# Patient Record
Sex: Female | Born: 1980 | Race: Black or African American | Hispanic: No | Marital: Single
Health system: Midwestern US, Community
[De-identification: ages and names within clinical notes are randomized; demographics above are authoritative.]

## PROBLEM LIST (undated history)

## (undated) DIAGNOSIS — Z9889 Other specified postprocedural states: Secondary | ICD-10-CM

## (undated) DIAGNOSIS — N200 Calculus of kidney: Secondary | ICD-10-CM

## (undated) DIAGNOSIS — J45909 Unspecified asthma, uncomplicated: Secondary | ICD-10-CM

## (undated) DIAGNOSIS — Z87442 Personal history of urinary calculi: Secondary | ICD-10-CM

## (undated) DIAGNOSIS — R112 Nausea with vomiting, unspecified: Secondary | ICD-10-CM

## (undated) HISTORY — PX: TONSILLECTOMY: SUR1361

## (undated) HISTORY — PX: LITHOTRIPSY: SUR834

---

## 2007-02-24 LAB — URINE MICROSCOPIC
Casts: 0 /LPF
Crystals, urine: 0 /LPF

## 2007-02-24 LAB — HCG URINE, QL. - POC: Pregnancy test,urine (POC): NEGATIVE

## 2007-02-24 NOTE — ED Provider Notes (Signed)
Urinary Frequency   The history is provided by the patient. This is a new problem. The current episode started yesterday. The problem has been occurring every urination. The problem has been unchanged since onset. The quality of the pain is burning. There has been no fever. She is sexually active. Associated symptoms include frequency and abdominal pain. Pertinent negatives include no chills, no sweats, no nausea, no vomiting, no discharge, no hematuria, no hesitancy, no vaginal discharge and no back pain. Associated Symptoms: suprapubic pain. The patient is not pregnant.She has tried nothing for the symptoms. Her past medical history is significant for kidney stones.        Review of Systems   Constitutional: Negative.  Negative for chills.   Gastrointestinal: Positive for abdominal pain. Negative for nausea and vomiting.   Genitourinary: Positive for dysuria, urgency and frequency. Negative for hesitancy and hematuria.   Musculoskeletal: Negative for back pain.       Physical Exam   Nursing note and vitals reviewed.  Constitutional: Vital signs are normal. She appears well-developed and well-nourished.   Cardiovascular: Normal rate, regular rhythm and normal heart sounds.  Exam reveals no gallop and no friction rub.    No murmur heard.  Pulmonary/Chest: Effort normal and breath sounds normal. No respiratory distress. She has no wheezes. She has no rales.   Abdominal: Bowel sounds are normal. She exhibits no distension. Soft. There is no organomegaly. No tenderness. She has no CVA tenderness.   Neurological: She is alert.   Skin: Skin is warm and dry.       Meperidine    History   Social History   ??? Marital Status: Single     Spouse Name: N/A     Number of Children: N/A   ??? Years of Education: N/A   Occupational History   ??? Not on file.   Social History Main Topics   ??? Tobacco Use: Never   ??? Alcohol Use: No   ??? Drug Use: No   ??? Sexually Active: Yes -- Female partner(s)     Birth Control/ Protection: None    Other Topics Concern   ??? Not on file   Social History Narrative   ??? No narrative on file

## 2007-02-24 NOTE — ED Notes (Signed)
Pt unable to void at present. Water given.

## 2007-02-24 NOTE — ED Notes (Signed)
TO FOLLOW UP WITH PRIMARY MD AND RESTURN WITH WORSENING

## 2007-03-14 MED ORDER — FLUCONAZOLE 100 MG TAB
100 mg | ORAL | Status: AC
Start: 2007-03-14 — End: 2007-03-14
  Administered 2007-03-14: 18:00:00 via ORAL

## 2007-03-14 MED ORDER — SODIUM CHLORIDE 0.9 % IV
Freq: Once | INTRAVENOUS | Status: DC
Start: 2007-03-14 — End: 2007-03-14

## 2007-03-14 MED ORDER — ONDANSETRON HCL 2 MG/ML IV
2 mg/mL | INTRAVENOUS | Status: AC
Start: 2007-03-14 — End: 2007-03-14
  Administered 2007-03-14: 18:00:00 via INTRAVENOUS

## 2007-03-14 MED ADMIN — ceftriaxone (ROCEPHIN) 1 g IVPB: INTRAVENOUS | @ 18:00:00 | NDC 68330000501

## 2007-03-14 MED FILL — SODIUM CHLORIDE 0.9 % IV PIGGY BACK: INTRAVENOUS | Qty: 50

## 2007-03-14 MED FILL — ONDANSETRON HCL 2 MG/ML IV: 2 mg/mL | INTRAVENOUS | Qty: 2

## 2007-03-14 MED FILL — FLUCONAZOLE 100 MG TAB: 100 mg | ORAL | Qty: 2

## 2007-03-14 NOTE — ED Notes (Signed)
I have reviewed discharge instructions with the patient.  The patient verbalized understanding.

## 2007-03-14 NOTE — ED Notes (Signed)
Patient tells me she was seen here for a uti and later became nauseated and could not take the levaquin sh was prescribed.  She is back with the same uti symptoms and a vaginal yeast infection.

## 2007-03-14 NOTE — ED Provider Notes (Signed)
Bladder Infection   The history is provided by the patient. The current episode started more than 1 week ago. The problem has been occurring every urination. The problem has been gradually worsening since onset. The quality of the pain is burning. The pain is at a severity of 8/10. The pain is moderate. There has been no fever. She is sexually active. There is no history of pyelonephritis. Associated symptoms include nausea, vomiting, discharge, frequency, hematuria, hesitancy and vaginal discharge. Pertinent negatives include no chills, no sweats, no flank pain, no abdominal pain and no back pain. The patient is not pregnant.She has tried antibiotics for the symptoms. The treatment(s) provided no relief. Her past medical history is significant for kidney stones and recurrent UTIs.        Review of Systems   Constitutional: Negative for chills.   Gastrointestinal: Positive for nausea and vomiting. Negative for abdominal pain.   Genitourinary: Positive for dysuria, hesitancy, urgency, frequency and hematuria. Negative for flank pain.   Musculoskeletal: Negative for back pain.   All other systems reviewed and are negative.      Physical Exam   Nursing note and vitals reviewed.  Constitutional: She is oriented. She appears well-developed and well-nourished.   HENT:   Head: Normocephalic and atraumatic.   Eyes: Conjunctivae are normal.   Neck: Normal range of motion. Neck supple.   Cardiovascular: Normal rate, regular rhythm and normal heart sounds.    Pulmonary/Chest: Effort normal.   Abdominal: Bowel sounds are normal. Soft.   Genitourinary: Discharge found.   Musculoskeletal: Normal range of motion. She exhibits no edema and no tenderness.   Neurological: She is alert and oriented.   Skin: Skin is warm and dry.   Psychiatric: She has a normal mood and affect. Her behavior is normal. Judgment and thought content normal.       Meperidine    History   Social History   ??? Marital Status: Single     Spouse Name: N/A      Number of Children: N/A   ??? Years of Education: N/A   Occupational History   ??? Not on file.   Social History Main Topics   ??? Tobacco Use: Never   ??? Alcohol Use: No   ??? Drug Use: No   ??? Sexually Active: Yes -- Female partner(s)     Birth Control/ Protection: None   Other Topics Concern   ??? Not on file   Social History Narrative   ??? No narrative on file

## 2007-03-17 LAB — CULTURE URINE-ER

## 2007-04-16 NOTE — ED Notes (Signed)
Pt to er for stomach pain. Pt states that she is in so much pain that she cant talk, but she is able to talk on her cell phone.

## 2007-04-16 NOTE — ED Provider Notes (Signed)
HPI Comments: Pt with 2 days of right flank pain. Nausea and vomiting. Subjective fever and chills. Recent visits last month for UTI. Nausea and vomiting.  Flank Pain   The history is provided by the patient. This is a new problem. The current episode started yesterday. The problem has been gradually worsening since onset. The problem has been occurring constantly. Patient reports no work related injury.The pain is present in the right side. The quality of the pain is cramping. The pain does not radiate. The pain is at a severity of 10/10. The pain is moderate. The pain is the same all the time. Associated symptoms include fever and abdominal pain. She has tried nothing for the symptoms. Risk factors include history of kidney stones. The patient's surgical history non-contributory        Past Medical History   Diagnosis Date   ??? Chronic Kidney Disease      kidney stones   ??? Other Ill-Defined Conditions      kidney stones          Past Surgical History   Procedure Date   ??? Hx cesarean section      x2   ??? Hx other surgical      lithotripsy           No family history on file.     History   Social History   ??? Marital Status: Single     Spouse Name: N/A     Number of Children: N/A   ??? Years of Education: N/A   Occupational History   ??? Not on file.   Social History Main Topics   ??? Tobacco Use: Never   ??? Alcohol Use: No   ??? Drug Use: No   ??? Sexually Active: Yes -- Female partner(s)     Birth Control/ Protection: None   Other Topics Concern   ??? Not on file   Social History Narrative   ??? No narrative on file           ALLERGIES: Meperidine      Review of Systems   Constitutional: Positive for fever and chills.   Gastrointestinal: Positive for abdominal pain.   Genitourinary: Positive for flank pain.   Musculoskeletal: Positive for back pain.   All other systems reviewed and are negative.      Filed Vitals:    04/16/2007  8:51 PM 04/16/2007 10:19 PM 04/16/2007 10:26 PM   BP: 118/64 119/56 119/56   Pulse: 68 56 62    Temp: 99.2 ??F (37.3 ??C)  99.9 ??F (37.7 ??C)   Resp: 18  22   Height: 6' (1.829 m)     Weight: 150 lb (68.04 kg)     SpO2: 100% 92% 99%              Physical Exam   Nursing note and vitals reviewed.  Constitutional: She is oriented. She appears well-developed and well-nourished. She appears not diaphoretic. She appears distressed.   HENT:   Head: Normocephalic and atraumatic.   Right Ear: External ear normal.   Left Ear: External ear normal.   Nose: Nose normal.   Mouth/Throat: Oropharynx is clear and moist.   Eyes: Conjunctivae and extraocular motions are normal. Pupils are equal, round, and reactive to light.   Neck: Normal range of motion. Neck supple.   Cardiovascular: Normal rate, normal heart sounds and intact distal pulses.    Pulmonary/Chest: Effort normal and breath sounds normal. She has no wheezes. She has  no rales. She exhibits no tenderness.   Abdominal: Bowel sounds are normal. She exhibits no distension. Soft. No tenderness. She has no guarding.   Musculoskeletal: Normal range of motion. She exhibits no edema and no tenderness.   Neurological: She is alert and oriented. No cranial nerve deficit. Coordination normal.   Skin: Skin is warm and dry. No rash noted. She is not diaphoretic. No erythema. No pallor.   Psychiatric: She has a normal mood and affect. Her behavior is normal. Judgment and thought content normal.

## 2007-04-17 LAB — METABOLIC PANEL, BASIC
Anion gap: 11 (ref 7–16)
Anion gap: 10 (ref 7–16)
BUN: 11 MG/DL (ref 7–18)
BUN: 12 MG/DL (ref 7–18)
CO2: 25 MMOL/L (ref 21–32)
CO2: 25 MMOL/L (ref 21–32)
Calcium: 9 MG/DL (ref 8.4–10.4)
Calcium: 8.9 MG/DL (ref 8.4–10.4)
Chloride: 103 MMOL/L (ref 98–107)
Chloride: 102 MMOL/L (ref 98–107)
Creatinine: 0.9 MG/DL (ref 0.6–1.0)
Creatinine: 1.1 MG/DL — ABNORMAL HIGH (ref 0.6–1.0)
GFR est AA: >60 mL/min/{1.73_m2} (ref >60)
GFR est AA: >60 mL/min/{1.73_m2} (ref >60)
GFR est non-AA: >60 mL/min/{1.73_m2} (ref >60)
GFR est non-AA: >60 mL/min/{1.73_m2} (ref >60)
Glucose: 90 MG/DL (ref 74–106)
Glucose: 121 MG/DL — ABNORMAL HIGH (ref 74–106)
Potassium: 3.8 MMOL/L (ref 3.5–5.1)
Potassium: 3.8 MMOL/L (ref 3.5–5.1)
Sodium: 139 MMOL/L (ref 136–145)
Sodium: 137 MMOL/L (ref 136–145)

## 2007-04-17 LAB — URINE MICROSCOPIC
Casts: 0 /LPF
Crystals, urine: 0 /LPF
RBC: 0 /HPF

## 2007-04-17 LAB — CBC WITH AUTOMATED DIFF
ABS. BASOPHILS: 0 10*3/uL (ref 0.0–0.2)
ABS. EOSINOPHILS: 0 10*3/uL (ref 0.00–0.80)
ABS. LYMPHOCYTES: 1.6 10*3/uL (ref 0.6–4.3)
ABS. MONOCYTES: 0.8 10*3/uL (ref 0.1–0.9)
ABS. NEUTROPHILS: 10.3 10*3/uL — ABNORMAL HIGH (ref 1.9–7.8)
BASOPHILS: 0 % — ABNORMAL LOW (ref 0.1–1.6)
EOSINOPHILS: 0 % — ABNORMAL LOW (ref 0.5–7.8)
HCT: 38.6 % (ref 35.6–45.0)
HGB: 13 g/dL (ref 11.7–15.0)
LYMPHOCYTES: 13 % — ABNORMAL LOW (ref 14.7–41.3)
MCH: 32.1 PG (ref 26.1–32.9)
MCHC: 33.8 g/dL (ref 31.4–35.0)
MCV: 95.2 FL (ref 79.6–97.8)
MONOCYTES: 6 % (ref 3.2–9.0)
MPV: 7.5 FL (ref 7.4–10.4)
NEUTROPHILS: 81 % — ABNORMAL HIGH (ref 47.0–74.6)
PLATELET: 261 10*3/uL (ref 140–440)
RBC: 4.06 M/uL (ref 3.86–5.18)
RDW: 13.9 % (ref 11.9–14.6)
WBC: 12.8 10*3/uL — ABNORMAL HIGH (ref 4.5–10.5)

## 2007-04-17 LAB — HCG URINE, QL. - POC: Pregnancy test,urine (POC): NEGATIVE

## 2007-04-17 MED ORDER — CEFTRIAXONE 1 GRAM SOLUTION FOR INJECTION
1 gram | INTRAMUSCULAR | Status: AC
Start: 2007-04-17 — End: 2007-04-16
  Administered 2007-04-17: 03:00:00 via INTRAVENOUS

## 2007-04-17 MED ORDER — SODIUM CHLORIDE 0.9% BOLUS IV
0.9 % | INTRAVENOUS | Status: DC
Start: 2007-04-17 — End: 2007-04-17
  Administered 2007-04-17: 03:00:00 via INTRAVENOUS

## 2007-04-17 MED ORDER — ONDANSETRON 8 MG TAB, RAPID DISSOLVE
8 mg | ORAL | Status: AC
Start: 2007-04-17 — End: 2007-04-17
  Administered 2007-04-17: 05:00:00 via ORAL

## 2007-04-17 MED ORDER — MORPHINE 10 MG/ML INJ SOLUTION
10 mg/ml | INTRAMUSCULAR | Status: AC
Start: 2007-04-17 — End: 2007-04-16
  Administered 2007-04-17: 03:00:00 via INTRAVENOUS

## 2007-04-17 MED ORDER — HYDROMORPHONE 2 MG/ML INJECTION SOLUTION
2 mg/mL | INTRAMUSCULAR | Status: AC
Start: 2007-04-17 — End: 2007-04-17

## 2007-04-17 MED ORDER — SODIUM CHLORIDE 0.9% BOLUS IV
0.9 % | INTRAVENOUS | Status: DC
Start: 2007-04-17 — End: 2007-04-17
  Administered 2007-04-17: 18:00:00 via INTRAVENOUS

## 2007-04-17 MED ORDER — KETOROLAC TROMETHAMINE 30 MG/ML INJECTION
30 mg/mL (1 mL) | INTRAMUSCULAR | Status: AC
Start: 2007-04-17 — End: 2007-04-17
  Administered 2007-04-17: 21:00:00 via INTRAVENOUS

## 2007-04-17 MED ORDER — HYDROMORPHONE 2 MG/ML INJECTION SOLUTION
2 mg/mL | INTRAMUSCULAR | Status: AC
Start: 2007-04-17 — End: 2007-04-17
  Administered 2007-04-17: 20:00:00 via INTRAVENOUS

## 2007-04-17 MED ADMIN — ondansetron (ZOFRAN) 16 mg in 0.9% sodium chloride 50 mL IVPB: INTRAVENOUS | @ 18:00:00 | NDC 72266012401

## 2007-04-17 MED ADMIN — Ondansetron (ZOFRAN) injection solution 4 mg: INTRAVENOUS | @ 03:00:00 | NDC 24200015844

## 2007-04-17 MED ADMIN — HYDROmorphone (DILAUDID) 2 mg/mL injection: INTRAVENOUS | @ 18:00:00 | NDC 00409131236

## 2007-04-17 MED ADMIN — levofloxacin (LEVAQUIN) 500 mg IVPB: INTRAVENOUS | @ 18:00:00 | NDC 72789008550

## 2007-04-17 MED FILL — HYDROMORPHONE 2 MG/ML INJECTION SOLUTION: 2 mg/mL | INTRAMUSCULAR | Qty: 1

## 2007-04-17 MED FILL — KETOROLAC TROMETHAMINE 30 MG/ML INJECTION: 30 mg/mL (1 mL) | INTRAMUSCULAR | Qty: 1

## 2007-04-17 MED FILL — LEVAQUIN 500 MG/100 ML IN 5% DEXTROSE INTRAVENOUS PIGGYBACK: 500 mg/100 mL | INTRAVENOUS | Qty: 100

## 2007-04-17 MED FILL — SODIUM CHLORIDE 0.9 % IV PIGGY BACK: INTRAVENOUS | Qty: 50

## 2007-04-17 MED FILL — MORPHINE 10 MG/ML SYRINGE: 10 mg/mL | INTRAMUSCULAR | Qty: 1

## 2007-04-17 MED FILL — ONDANSETRON HCL 2 MG/ML IV: 2 mg/mL | INTRAVENOUS | Qty: 8

## 2007-04-17 MED FILL — ONDANSETRON (PF) 4 MG/2 ML INJECTION: 4 mg/2 mL | INTRAMUSCULAR | Qty: 2

## 2007-04-17 NOTE — ED Notes (Signed)
Pt here last pm and tx for UTI. PT states continuing pain and N/v.  Pt states vomiting all day.  Pt c/o RLQ pain and is writhing on bed.  Pt w/vomiting x1.

## 2007-04-17 NOTE — ED Notes (Signed)
Pt sleeping in no distress, awakened pt to inform giving more med,pt then began complaing of pain.

## 2007-04-17 NOTE — ED Notes (Signed)
Pt seen ambulatory at doorway leaving hosp man (brother assumed )w/ pt

## 2007-04-17 NOTE — ED Provider Notes (Signed)
HPI Comments: Patient seen last night at [facility] for right flank pain and was diagnosed with  " kidney stone and kidney infection " according to patient.  She was discharged this morning with prescriptions for Cipro, Lortab and phenergan.  She returns to [facility] c/o not being able to keep anything down since leaving the ED this morning. She reports she is urinating.  No F/C or gross hematuria.  Back Pain   The history is provided by the patient.        Past Medical History   Diagnosis Date   ??? Chronic Kidney Disease      kidney stones   ??? Other Ill-Defined Conditions      kidney stones          Past Surgical History   Procedure Date   ??? Hx cesarean section      x2   ??? Hx other surgical      lithotripsy           No family history on file.     History   Social History   ??? Marital Status: Single     Spouse Name: N/A     Number of Children: N/A   ??? Years of Education: N/A   Occupational History   ??? Not on file.   Social History Main Topics   ??? Tobacco Use: Never   ??? Alcohol Use: No   ??? Drug Use: No   ??? Sexually Active: Yes -- Female partner(s)     Birth Control/ Protection: None   Other Topics Concern   ??? Not on file   Social History Narrative   ??? No narrative on file           ALLERGIES: Meperidine      Review of Systems   Musculoskeletal: Positive for back pain.   All other systems reviewed and are negative.      Filed Vitals:    04/17/2007 12:46 PM   BP: 100/62   Pulse: 73   Temp: 98.2 ??F (36.8 ??C)   Resp: 18   Height: 6' (1.829 m)   Weight: 150 lb (68.04 kg)   SpO2: 100%              Physical Exam   Nursing note and vitals reviewed.  Constitutional: She is oriented. She appears well-nourished. She appears not diaphoretic. No distress.        Supine in NAD or apparent discomfort.   HENT:   Head: Normocephalic and atraumatic.        Tongue appears mildly dry, lips are dry   Eyes: Conjunctivae are normal. Pupils are equal, round, and reactive to light. No scleral icterus.   Neck: Normal range of motion. Neck supple.    Cardiovascular: Normal rate and regular rhythm.    Pulmonary/Chest: Effort normal and breath sounds normal.   Abdominal: Soft. No tenderness. She has no rebound and no guarding.   Genitourinary:        Right flank discomfort to percussion   Musculoskeletal: Normal range of motion. She exhibits no edema and no tenderness.   Neurological: She is alert and oriented.   Skin: Skin is warm and dry. She is not diaphoretic.

## 2007-04-17 NOTE — ED Notes (Signed)
IV Levaquin stopped to infuse IV zofran.

## 2007-04-17 NOTE — ED Notes (Signed)
Pt sleeping awakened and given ice chips via dr. Grier Rocher

## 2007-04-17 NOTE — ED Notes (Signed)
Pt given d/c instructions-verbalized understanding,instructed pt no driving,verbalized understanding

## 2007-04-17 NOTE — ED Notes (Signed)
Lpt cont in er dept waiting on ride n/v emesis in trashcan requesting mylicon or some kind of RX  Lper DR Kerry Hough -zofran 49m ODT given   Verified w/ ride -brother- "is on his way"

## 2007-04-17 NOTE — ED Notes (Signed)
Pt returned due to vomitting

## 2007-04-17 NOTE — ED Notes (Signed)
Pt ride arrived to take pt home

## 2007-04-19 LAB — CULTURE URINE-ER

## 2007-04-24 LAB — CBC W/O DIFF
HCT: 33.4 % — ABNORMAL LOW (ref 35.6–45.0)
HGB: 11.1 g/dL — ABNORMAL LOW (ref 11.7–15.0)
MCH: 31.5 PG (ref 26.1–32.9)
MCHC: 33.3 g/dL (ref 31.4–35.0)
MCV: 94.6 FL (ref 79.6–97.8)
MPV: 7.3 FL — ABNORMAL LOW (ref 7.4–10.4)
PLATELET: 255 10*3/uL (ref 140–440)
RBC: 3.53 M/uL — ABNORMAL LOW (ref 3.86–5.18)
RDW: 14 % (ref 11.9–14.6)
WBC: 7.9 10*3/uL (ref 4.5–10.5)

## 2007-04-24 LAB — METABOLIC PANEL, BASIC
Anion gap: 11 (ref 7–16)
BUN: 10 MG/DL (ref 7–18)
CO2: 26 MMOL/L (ref 21–32)
Calcium: 8.4 MG/DL (ref 8.4–10.4)
Chloride: 101 MMOL/L (ref 98–107)
Creatinine: 1.1 MG/DL — ABNORMAL HIGH (ref 0.6–1.0)
GFR est AA: >60 mL/min/{1.73_m2} (ref >60)
GFR est non-AA: >60 mL/min/{1.73_m2} (ref >60)
Glucose: 98 MG/DL (ref 74–106)
Potassium: 3.3 MMOL/L — ABNORMAL LOW (ref 3.5–5.1)
Sodium: 138 MMOL/L (ref 136–145)

## 2007-04-24 MED ORDER — SODIUM CHLORIDE 0.9% BOLUS IV
0.9 % | INTRAVENOUS | Status: DC
Start: 2007-04-24 — End: 2007-04-24
  Administered 2007-04-24: 09:00:00 via INTRAVENOUS

## 2007-04-24 MED ORDER — KETOROLAC TROMETHAMINE 30 MG/ML INJECTION
30 mg/mL (1 mL) | INTRAMUSCULAR | Status: AC
Start: 2007-04-24 — End: 2007-04-24
  Administered 2007-04-24: 09:00:00 via INTRAVENOUS

## 2007-04-24 MED ADMIN — morphine injection 2 mg: INTRAVENOUS | @ 10:00:00 | NDC 76045000411

## 2007-04-24 MED FILL — KETOROLAC TROMETHAMINE 30 MG/ML INJECTION: 30 mg/mL (1 mL) | INTRAMUSCULAR | Qty: 1

## 2007-04-24 MED FILL — MORPHINE 2 MG/ML INJECTION: 2 mg/mL | INTRAMUSCULAR | Qty: 1

## 2007-04-24 NOTE — ED Notes (Signed)
Continue to await CT results.

## 2007-04-24 NOTE — ED Provider Notes (Incomplete)
Back Pain   The history is provided by the patient. This is a chronic problem. The current episode started more than 2 days ago. The problem has been unchanged since onset. The problem has been occurring constantly. Patient reports no work related injury.The pain is associated with no known injury. The pain is present in the right side and lower back. The quality of the pain is stabbing. The pain is at a severity of 10/10. The pain is severe. Associated symptoms include abdominal pain and dysuria. She has tried analgesics for the symptoms. The treatment(s) provided no relief. Risk factors include history of kidney stones and pyelonephritis.   Past Medical History   Diagnosis Date   ??? Chronic Kidney Disease      kidney stones   ??? Other Ill-Defined Conditions      kidney stones          Past Surgical History   Procedure Date   ??? Hx cesarean section      x2   ??? Hx other surgical      lithotripsy           No family history on file.     History   Social History   ??? Marital Status: Single     Spouse Name: N/A     Number of Children: N/A   ??? Years of Education: N/A   Occupational History   ??? Not on file.   Social History Main Topics   ??? Tobacco Use: Never   ??? Alcohol Use: No   ??? Drug Use: No   ??? Sexually Active: Yes -- Female partner(s)     Birth Control/ Protection: None   Other Topics Concern   ??? Not on file   Social History Narrative   ??? No narrative on file           ALLERGIES: Meperidine      Review of Systems   Gastrointestinal: Positive for nausea and abdominal pain.   Genitourinary: Positive for dysuria.   Musculoskeletal: Positive for back pain.       Filed Vitals:    04/24/2007  4:43 AM   BP: 113/55   Pulse: 96   Temp: 98.7 ??F (37.1 ??C)   Resp: 16   Height: 6' (1.829 m)   Weight: 140 lb (63.504 kg)   SpO2: 100%              Physical Exam   Nursing note and vitals reviewed.  Constitutional: She is oriented. She appears well-developed and well-nourished.   HENT:   Head: Normocephalic and atraumatic.    Eyes: Conjunctivae and extraocular motions are normal. Pupils are equal, round, and reactive to light.   Neck: Normal range of motion. Neck supple.   Cardiovascular: Normal rate, regular rhythm, normal heart sounds and intact distal pulses.    Pulmonary/Chest: Effort normal and breath sounds normal.   Abdominal: Tenderness is present.   Musculoskeletal: Normal range of motion.   Neurological: She is alert and oriented.   Skin: Skin is warm and dry.   Psychiatric: She has a normal mood and affect. Her behavior is normal. Judgment and thought content normal.          Patient on CT scan has multiple stones. She is contol in her pain. She has an appointment with her PCP Dr. Chilton Si to discuss treatment options. She has an urologist at [facility].

## 2007-04-24 NOTE — ED Notes (Signed)
Discussed with Dr Deloria Lair urology.  She will be managed as an outpatient.

## 2007-04-24 NOTE — ED Notes (Signed)
I have reviewed discharge instructions with the patient.  The patient verbalized understanding. To follow up with referral MD and return with worsening. Instructed not to drive. To return with worsening.

## 2007-04-24 NOTE — ED Notes (Signed)
To xray for ct

## 2007-04-26 LAB — URINE MICROSCOPIC
Casts: 0 /LPF
Crystals, urine: 0 /LPF
Mucus: 0 /LPF

## 2007-04-26 LAB — URINE MACROSCOPIC
Bilirubin: NEGATIVE
Blood: NEGATIVE
Glucose: NEGATIVE MG/DL
Ketone: 15 MG/DL — AB
Nitrites: NEGATIVE
Protein: NEGATIVE MG/DL
Specific gravity: 1.015 (ref 1.001–1.02)
Urobilinogen: 0.2 EU/DL (ref 0.2–1.0)
pH (UA): 7 (ref 5.0–9.0)

## 2007-04-26 MED ORDER — MIDAZOLAM 1 MG/ML IJ SOLN
1 mg/mL | Freq: Once | INTRAMUSCULAR | Status: DC
Start: 2007-04-26 — End: 2007-04-29

## 2007-04-26 MED ORDER — OXYCODONE-ACETAMINOPHEN 5 MG-500 MG CAP
5-500 mg | Freq: Once | ORAL | Status: DC
Start: 2007-04-26 — End: 2007-04-29

## 2007-04-26 MED ORDER — ONDANSETRON (PF) 4 MG/2 ML INJECTION
4 mg/2 mL | INTRAMUSCULAR | Status: DC | PRN
Start: 2007-04-26 — End: 2007-04-29

## 2007-04-26 MED ORDER — LACTATED RINGERS IV
INTRAVENOUS | Status: DC
Start: 2007-04-26 — End: 2007-04-29

## 2007-04-26 MED ORDER — LIDOCAINE HCL 1 % (10 MG/ML) IJ SOLN
10 mg/mL (1 %) | Freq: Once | INTRAMUSCULAR | Status: DC
Start: 2007-04-26 — End: 2007-04-29

## 2007-04-26 MED ORDER — OXYCODONE-ACETAMINOPHEN 5 MG-500 MG CAP
5-500 mg | Freq: Once | ORAL | Status: AC
Start: 2007-04-26 — End: 2007-04-26

## 2007-04-26 MED ORDER — ATROPINE 0.4 MG/ML IJ SOLN
0.4 mg/mL | INTRAMUSCULAR | Status: DC | PRN
Start: 2007-04-26 — End: 2007-04-29

## 2007-04-26 MED ORDER — SODIUM CHLORIDE 0.9 % IV
INTRAVENOUS | Status: DC
Start: 2007-04-26 — End: 2007-04-29

## 2007-04-26 MED ORDER — MORPHINE 10 MG/ML INJ SOLUTION
10 mg/ml | INTRAMUSCULAR | Status: DC | PRN
Start: 2007-04-26 — End: 2007-04-29

## 2007-04-26 MED ORDER — CEFAZOLIN 1 GRAM/50 ML IN DEXTROSE (ISO-OSMOTIC) IVPB
1 gram/50 mL | Freq: Once | INTRAVENOUS | Status: DC
Start: 2007-04-26 — End: 2007-04-29

## 2007-04-26 MED ADMIN — midazolam (VERSED) 1 mg/mL injection: INTRAVENOUS | @ 17:00:00 | NDC 66758001802

## 2007-04-26 MED ADMIN — oxycodone-acetaminophen (TYLOX) 5-500 mg per capsule: @ 19:00:00 | NDC 60951066085

## 2007-04-26 MED ADMIN — cefazolin (ANCEF) 1 gram/50 mL IVPB: @ 17:00:00 | NDC 63323023765

## 2007-04-26 MED FILL — CEFAZOLIN 1 GRAM/50 ML IN DEXTROSE (ISO-OSMOTIC) IVPB: 1 gram/50 mL | INTRAVENOUS | Qty: 1

## 2007-04-26 MED FILL — LACTATED RINGERS IV: INTRAVENOUS | Qty: 1000

## 2007-04-26 MED FILL — OXYCODONE-ACETAMINOPHEN 5 MG-500 MG CAP: 5-500 mg | ORAL | Qty: 1

## 2007-04-26 MED FILL — MIDAZOLAM 1 MG/ML IJ SOLN: 1 mg/mL | INTRAMUSCULAR | Qty: 2

## 2007-04-29 NOTE — Progress Notes (Unsigned)
[facility]   [address]   [place], [place]. [address]   [phone]     OPERATIVE REPORT    NAME: Sejla, Marzano MR: [id number]  LOC: SO SEX: F ACCT: [id number]  DOB: 07-25-1980 AGE: 26 PT: O  ADMIT: 04/26/2007 DSCH: MSV: MED    DATE OF SURGERY: 04/26/2007    PREOPERATIVE DIAGNOSIS: Right upper ureteral stone.    POSTOPERATIVE DIAGNOSIS: Right upper ureteral stone.    PROCEDURE: Right extracorporeal shock-wave lithotripsy.    SURGEON: Almir Botts E. Jakhiya Brower, M.D.    ANESTHESIA: Sedation.    ESTIMATED BLOOD LOSS: None.    COMPLICATIONS: None.    INDICATIONS: The patient is a 26 year old female with a 6 mm right upper  ureteral stone.    OPERATIVE NOTE IN DETAIL: The patient was taken to the operating room  and placed supine on the lithotripsy table. After induction of adequate  sedation, the stone was localized under fluoroscopy. It appeared to  fragment very well at the termination of the procedure. She tolerated  the procedure well and was awakened from anesthesia and taken to the  recovery room in stable condition.        ____________________________________Brant Tommi Emery, MD A     This is an unverified document unless signed by physician.    TID: [id number] DT: 04/29/2007 5:47 A  JOB: [id number] DOC#: [id number] DD: 04/28/2007    cc: Teodora Medici, MD

## 2007-05-07 MED FILL — PROMETHAZINE 25 MG/ML INJECTION: 25 mg/mL | INTRAMUSCULAR | Qty: 1

## 2007-05-07 MED FILL — NALBUPHINE 10 MG/ML INJECTION: 10 mg/mL | INTRAMUSCULAR | Qty: 1

## 2007-09-13 MED ORDER — AMOXICILLIN 500 MG TABLET
500 mg | ORAL_TABLET | Freq: Three times a day (TID) | ORAL | Status: DC
Start: 2007-09-13 — End: 2007-10-02

## 2007-09-13 NOTE — ED Notes (Signed)
This RN for dc only.

## 2007-09-13 NOTE — ED Provider Notes (Addendum)
Ear Pain   The history is provided by the patient. This is a new problem. The current episode started more than 2 days ago (3 days ago). The problem occurs constantly. The problem has not changed since onset. Patient complains that both ears are affected.  Patient reports a subjective fever - was not measured.Associated symptoms include sore throat and cough.        Past Medical History   Diagnosis Date   ??? Chronic Kidney Disease      kidney stones   ??? Other Ill-Defined Conditions      kidney stones          Past Surgical History   Procedure Date   ??? Hx cesarean section      x2   ??? Hx heent      tonsil   ??? Hx other surgical      lithotripsy           No family history on file.     History   Social History   ??? Marital Status: Single     Spouse Name: N/A     Number of Children: N/A   ??? Years of Education: N/A   Occupational History   ??? Not on file.   Social History Main Topics   ??? Tobacco Use: Never   ??? Alcohol Use: No   ??? Drug Use: No   ??? Sexually Active: Yes -- Female partner(s)     Birth Control/ Protection: None   Other Topics Concern   ??? Not on file   Social History Narrative   ??? No narrative on file           ALLERGIES: Meperidine      Review of Systems   Constitutional: Positive for fever.   HENT: Positive for ear pain and sore throat.    Respiratory: Positive for cough.    All other systems reviewed and are negative.      Filed Vitals:    09/13/2007  8:36 AM   BP: 155/55   Pulse: 84   Temp: 97.5 ??F (36.4 ??C)   Resp: 19   Height: 6' (1.829 m)   Weight: 150 lb (68.04 kg)   SpO2: 99%              Physical Exam   Nursing note and vitals reviewed.  Constitutional: She is oriented. She appears well-developed and well-nourished. No distress.   HENT:   Head: Normocephalic and atraumatic.   Right Ear: External ear normal.   Left Ear: External ear normal.   Mouth/Throat: Oropharynx is clear and moist.   Eyes: Conjunctivae are normal. Pupils are equal, round, and reactive to light.    Neck: Normal range of motion. Neck supple.   Cardiovascular: Normal rate, regular rhythm and normal heart sounds.  Exam reveals no gallop and no friction rub.    No murmur heard.  Pulmonary/Chest: Effort normal and breath sounds normal. No respiratory distress. She has no wheezes. She has no rales.        Lungs clear   Abdominal: Soft. Bowel sounds are normal. She exhibits no distension. No tenderness.   Musculoskeletal: Normal range of motion.   Neurological: She is alert and oriented.   Skin: Skin is warm and dry.   Psychiatric: She has a normal mood and affect. Her behavior is normal.        Coding

## 2007-09-13 NOTE — ED Notes (Signed)
I have reviewed discharge instructions with the patient.  The patient verbalized understanding. To follow up with primary physician and return with worsening.

## 2007-10-02 NOTE — ED Notes (Signed)
[place]: viable single IUP. Right ovarian cyst. Uterine fibroid

## 2007-10-02 NOTE — ED Notes (Signed)
Discharge instructions given to patient  1 prescription, ovarian cyst sheet  Patient understands discharge instructions

## 2007-10-02 NOTE — ED Provider Notes (Signed)
HPI Comments: Patient has a known h/o kidney stones. She has seen Dr. Cedric Fishman for these in the past. She also has a known ovarian cyst on the right. She is about 2 months pregnant. She has an OB appointment with Dr. Samuella Cota next week. Denies any abdominal pain, fever, or vaginal bleeding.    Flank Pain   The history is provided by the patient. This is a recurrent problem. The problem has not changed since onset. The pain is associated with no known injury. The pain is present in the right side. The pain quality is described as similar to previous episodes. The pain is mild. Pertinent negatives include no fever, no abdominal pain and no dysuria. Risk factors include history of kidney stones.        Past Medical History   Diagnosis Date   ??? Chronic Kidney Disease      kidney stones   ??? Other Ill-Defined Conditions      kidney stones          Past Surgical History   Procedure Date   ??? Hx cesarean section      x2   ??? Hx heent      tonsil   ??? Hx other surgical      lithotripsy           No family history on file.     History   Social History   ??? Marital Status: Single     Spouse Name: N/A     Number of Children: N/A   ??? Years of Education: N/A   Occupational History   ??? Not on file.   Social History Main Topics   ??? Tobacco Use: Never   ??? Alcohol Use: No   ??? Drug Use: No   ??? Sexually Active: Yes -- Female partner(s)     Birth Control/ Protection: None   Other Topics Concern   ??? Not on file   Social History Narrative   ??? No narrative on file           ALLERGIES: Meperidine      Review of Systems   Constitutional: Negative for fever.   Respiratory: Negative.    Cardiovascular: Negative.    Gastrointestinal: Negative for nausea, vomiting and abdominal pain.   Genitourinary: Positive for flank pain. Negative for dysuria, urgency, frequency and hematuria.   Musculoskeletal: Positive for back pain.   All other systems reviewed and are negative.      Filed Vitals:    10/02/2007 11:12 AM 10/02/2007 11:35 AM 10/02/2007  1:41 PM    BP: 107/56  123/55   Pulse: 67  87   Temp: 98.6 ??F (37 ??C)     Resp: 20  18   Height: 6' (1.829 m)     Weight: 157 lb (71.215 kg)     SpO2: 98% 98%               Physical Exam   Nursing note and vitals reviewed.  Constitutional: She is oriented. She appears well-developed and well-nourished. She appears not diaphoretic. No distress.   HENT:   Head: Normocephalic and atraumatic.   Nose: Nose normal.   Mouth/Throat: Oropharynx is clear and moist.   Eyes: Conjunctivae and extraocular motions are normal. Pupils are equal, round, and reactive to light.   Neck: Normal range of motion. Neck supple.   Cardiovascular: Normal rate, regular rhythm and normal heart sounds.    Pulmonary/Chest: Effort normal and breath sounds normal.  Abdominal: Soft. No tenderness. She has no rebound and no guarding.   Musculoskeletal: Normal range of motion. She exhibits no edema and no tenderness.   Neurological: She is alert and oriented.   Skin: Skin is warm and dry. She is not diaphoretic.        MDM Coding   Interpretation: ultrasound                The patient was observed in the ED.    Results Reviewed:      No results found for this or any previous visit (from the past 24 hour(s)).    I discussed the results of all labs, procedures, radiographs, and treatments with the patient and available family.  Treatment plan is agreed upon and the patient is ready for discharge.  All voiced understanding of the discharge plan and medication instructions or changes as appropriate.  Questions about treatment in the ED were answered.  All were encouraged to return should symptoms worsen or new problems develop.

## 2007-10-02 NOTE — ED Notes (Signed)
To Bed B  C/o flank pain

## 2007-10-07 LAB — RPR
RPR, EXTERNAL: REACTIVE
RPR, External: REACTIVE

## 2007-10-07 LAB — BLOOD TYPE, (ABO+RH)
ABO,Rh: A POS
TYPE, ABO & RH, EXTERNAL: A POS

## 2007-10-07 LAB — HEMOGLOBIN: Hgb, External: 12

## 2007-10-07 LAB — TSH 3RD GENERATION: TSH, External: 0.28

## 2007-10-07 LAB — ANTIBODY SCREEN: Antibody screen, External: NEGATIVE

## 2007-10-07 LAB — N GONORRHOEAE, DNA PROBE: Gonorrhea, External: NEGATIVE

## 2007-10-07 LAB — RUBELLA AB, IGM

## 2007-10-07 LAB — URINALYSIS W/O MICRO

## 2007-10-07 LAB — CHLAMYDIA DNA PROBE: Chlamydia, External: NEGATIVE

## 2007-10-07 LAB — HEP B SURFACE AG: HBsAg, External: NEGATIVE

## 2007-10-07 LAB — HEMATOCRIT: Hct, External: 32.7

## 2007-10-19 LAB — INFLUENZA A & B AG (RAPID TEST)
Influenza A Ag: NEGATIVE
Influenza B Ag: NEGATIVE

## 2007-10-19 MED ORDER — ACETAMINOPHEN 500 MG TAB
500 mg | ORAL | Status: AC
Start: 2007-10-19 — End: 2007-10-19
  Administered 2007-10-19: 15:00:00 via ORAL

## 2007-10-19 MED FILL — ACETAMINOPHEN 500 MG TAB: 500 mg | ORAL | Qty: 2

## 2007-10-19 NOTE — ED Notes (Signed)
I have reviewed the chart and agree with the midlevel provider's course of treatment.  Demetrios Byron A Corbett III, MD

## 2007-10-19 NOTE — ED Provider Notes (Signed)
HPI Comments: Denise Turner is a 27 y.o. African American female who is going on 3 months pregnant and feels like she has the flu.  Vomited last night, and today she has a headache, cough and nasal congestion. Neck fine.  Headache is mild and was of gradual onset. Denies fever and aches, but feels chilled. (exam room quite cold). No diarrhea or abdominal pain. No vaginal bleeding. Denies sore throat.          Headache   The history is provided by the patient.        Past Medical History   Diagnosis Date   ??? Chronic Kidney Disease      kidney stones   ??? Other Ill-Defined Conditions      kidney stones          Past Surgical History   Procedure Date   ??? Hx cesarean section      x2   ??? Hx heent      tonsil   ??? Hx other surgical      lithotripsy           No family history on file.     History   Social History   ??? Marital Status: Single     Spouse Name: N/A     Number of Children: N/A   ??? Years of Education: N/A   Occupational History   ??? Not on file.   Social History Main Topics   ??? Tobacco Use: Never   ??? Alcohol Use: No   ??? Drug Use: No   ??? Sexually Active: Yes -- Female partner(s)     Birth Control/ Protection: None   Other Topics Concern   ??? Not on file   Social History Narrative   ??? No narrative on file           ALLERGIES: Meperidine      Review of Systems   All other systems reviewed and are negative.      Filed Vitals:    10/19/2007 10:24 AM 10/19/2007 10:28 AM   BP:  116/47   Pulse:  83   Temp: 97.7 ??F (36.5 ??C)    Resp:  20   Height:  6' (1.829 m)   Weight:  157 lb (71.215 kg)   SpO2:  98%              Physical Exam   Nursing note and vitals reviewed.  Constitutional: She is oriented. She appears well-developed and well-nourished. She appears not diaphoretic. No distress.   HENT:   Head: Normocephalic and atraumatic.   Right Ear: External ear normal.   Left Ear: External ear normal.    Eyes: Conjunctivae and extraocular motions are normal. Pupils are equal, round, and reactive to light. Right eye exhibits no discharge. Left eye exhibits no discharge.   Neck: Normal range of motion. Neck supple.   Cardiovascular: Normal rate, regular rhythm and normal heart sounds.    Pulmonary/Chest: Effort normal and breath sounds normal.   Musculoskeletal: Normal range of motion.   Lymphadenopathy:     She has no cervical adenopathy.   Neurological: She is alert and oriented. No cranial nerve deficit. She exhibits normal muscle tone. Coordination normal.   Skin: Skin is warm and dry. She is not diaphoretic.   Psychiatric: She has a normal mood and affect. Her behavior is normal. Judgment and thought content normal.        Coding

## 2007-10-19 NOTE — ED Notes (Signed)
Pt given d/c instructions-verbalized understanding

## 2007-10-19 NOTE — ED Notes (Signed)
PT. STATES SHE IS 2 MONTHS PREGNANT/STATES SHE DOESN'T REMEMBER WHEN HER LAST PERIOD WAS

## 2007-11-14 NOTE — ED Notes (Signed)
I have reviewed the chart and agree with the midlevel provider's course of treatment.  Taggert Bozzi Louis Arnel Wymer, MD

## 2007-12-19 ENCOUNTER — Emergency Department (HOSPITAL_COMMUNITY): Admission: EM | Admit: 2007-12-19 | Discharge: 2007-12-19 | Payer: Self-pay | Admitting: Emergency Medicine

## 2008-01-31 LAB — POC URINE DIPSTICK MANUAL
Ketones (POC): NEGATIVE
Leukocyte esterase (POC): NEGATIVE

## 2008-01-31 MED ADMIN — terbutaline (BRETHINE) injection 0.25 mg: SUBCUTANEOUS | @ 17:00:00 | NDC 54569168901

## 2008-01-31 MED ADMIN — nalbuphine (NUBAIN) injection 10 mg: INTRAMUSCULAR | @ 17:00:00 | NDC 63481043210

## 2008-01-31 MED FILL — NALBUPHINE 10 MG/ML INJECTION: 10 mg/mL | INTRAMUSCULAR | Qty: 1

## 2008-01-31 MED FILL — TERBUTALINE 1 MG/ML SUB-Q: 1 mg/mL | SUBCUTANEOUS | Qty: 1

## 2008-01-31 NOTE — Progress Notes (Signed)
Pt given dc instructions, dc to home denies questions or needs. 10mg  nubain given im and 0.25mg  brethine given sq right arm, per md order. Pt tested negative for leukocytes and neg for ketones on urine dip

## 2008-02-26 LAB — RPR
RPR, EXTERNAL: NONREACTIVE
RPR, External: NONREACTIVE

## 2008-03-01 LAB — URINALYSIS W/O MICRO
Bilirubin: NEGATIVE
Blood: NEGATIVE
Casts: 0 /LPF
Crystals, urine: 0 /LPF
Glucose: NEGATIVE MG/DL
Ketone: NEGATIVE MG/DL
Leukocyte Esterase: NEGATIVE
Nitrites: NEGATIVE
Protein: NEGATIVE MG/DL
Specific gravity: 1.015 (ref 1.001–1.02)
Urobilinogen: 0.2 EU/DL
pH (UA): 7 (ref 5.0–9.0)

## 2008-03-01 LAB — POC URINE DIPSTICK MANUAL
Glucose POC: NEGATIVE
Ketones (POC): NEGATIVE

## 2008-03-01 NOTE — Progress Notes (Signed)
Dr. Tacey Ruiz called with UA results.  Orders received to d/c pt home.  MD will call in [facility] prescription for pt to pick up.  D/C instructions given to pt. Pt verbalizes understanding. Questions encouraged.  Pt stable at d/c.

## 2008-03-01 NOTE — Progress Notes (Signed)
Pt I/O cath for UA.  Urine sent to lab.  SVD 1/25/-2

## 2008-03-03 LAB — CULTURE, URINE
Culture result:: NO GROWTH
Culture: NO GROWTH

## 2008-04-04 LAB — INFLUENZA A & B AG (RAPID TEST)
Influenza A Ag: NEGATIVE
Influenza B Ag: NEGATIVE

## 2008-04-04 NOTE — ED Provider Notes (Signed)
Fever   The history is provided by the patient. This is a new problem. The current episode started more than 2 days ago (three days). The problem occurs constantly. The problem has not changed since onset. Patient reports a subjective fever - was not measured.Associated symptoms include congestion, sore throat and cough. She has tried acetaminophen for the symptoms. The treatment provided no relief.        Past Medical History   Diagnosis Date   ??? Other Ill-Defined Conditions      kidney stones   ??? Chronic Kidney Disease      kidney stones          Past Surgical History   Procedure Date   ??? Hx cesarean section      x2   ??? Hx heent      tonsil   ??? Cesarean delivery only    ??? Hx other surgical      lithotripsy           No family history on file.     History   Social History   ??? Marital Status: Single     Spouse Name: N/A     Number of Children: N/A   ??? Years of Education: N/A   Occupational History   ??? Not on file.   Social History Main Topics   ??? Tobacco Use: Never   ??? Alcohol Use: No   ??? Drug Use: No   ??? Sexually Active: Yes -- Female partner(s)     Birth Control/ Protection: None   Other Topics Concern   ??? Not on file   Social History Narrative   ??? No narrative on file           ALLERGIES: Meperidine      Review of Systems   Constitutional: Positive for fever and chills.   HENT: Positive for congestion, sore throat and rhinorrhea.    Respiratory: Positive for cough.    Musculoskeletal: Positive for myalgias.       Filed Vitals:    04/04/2008 11:18 AM   BP: 109/76   Pulse: 120   Temp: 98.2 ??F (36.8 ??C)   Resp: 22   Height: 6' (1.829 m)   Weight: 200 lb (90.719 kg)   SpO2: 99%              Physical Exam   Nursing note and vitals reviewed.  Constitutional: She is oriented. She appears well-developed and well-nourished.   HENT:   Head: Normocephalic and atraumatic.   Right Ear: External ear normal. A middle ear effusion is present.   Left Ear: External ear normal.   Nose: Nose normal.    Mouth/Throat: Oropharyngeal exudate present.   Eyes: Conjunctivae and extraocular motions are normal. Pupils are equal, round, and reactive to light.   Neck: Normal range of motion. Neck supple.   Cardiovascular: Normal rate, regular rhythm, normal heart sounds and intact distal pulses.    Pulmonary/Chest: Effort normal and breath sounds normal. No respiratory distress. She has no wheezes.   Abdominal: Soft. Bowel sounds are normal.   Musculoskeletal: Normal range of motion. She exhibits no edema and no tenderness.   Neurological: She is alert and oriented. No cranial nerve deficit. She exhibits normal muscle tone.   Skin: Skin is warm and dry.   Psychiatric: She has a normal mood and affect. Her behavior is normal. Judgment and thought content normal.            MDM Coding  Reviewed: nursing note and vitals

## 2008-04-04 NOTE — ED Notes (Signed)
PMD-Center for [facility]. Pt has had fever and upper respiratory symptoms x 3 days.

## 2008-04-04 NOTE — ED Notes (Signed)
Pt to desk states she has to leave and can not wait for the results of flu test.  AMA form signed by patient.

## 2008-04-09 LAB — GYN RAPID GP B STREP: GrBStrep, External: POSITIVE

## 2008-04-14 NOTE — Progress Notes (Signed)
Dr Samuella Cota notifyed may dc to home to follow up in office dc instructions given off efm home undelivered no rom

## 2008-04-14 NOTE — Progress Notes (Signed)
Chief complaint r/o rom

## 2008-05-05 NOTE — Progress Notes (Signed)
Updated patient on lab progress and results that had resulted. Patient indicated that she felt better.

## 2008-05-05 NOTE — Progress Notes (Signed)
Patient discharged undelivered.

## 2008-05-05 NOTE — Progress Notes (Signed)
Dr. Samuella Cota updated on patient status. Orders received for CBC and BMP, and Nubain 10 mg IM.

## 2008-05-05 NOTE — Progress Notes (Signed)
Nubain 10 mg IM given per Md orders

## 2008-05-05 NOTE — Progress Notes (Signed)
Patient present to L and d for contractions and abdominal pain. Patient states that she feels pain also in her back.

## 2008-05-06 LAB — METABOLIC PANEL, BASIC
Anion gap: 11 mmol/L (ref 7–16)
BUN: 9 MG/DL (ref 7–18)
CO2: 21 MMOL/L (ref 21–32)
Calcium: 9.1 MG/DL (ref 8.4–10.4)
Chloride: 103 MMOL/L (ref 98–107)
Creatinine: 0.9 MG/DL (ref 0.6–1.0)
GFR est AA: >60 mL/min/{1.73_m2} (ref >60)
GFR est non-AA: >60 mL/min/{1.73_m2} (ref >60)
Glucose: 116 MG/DL — ABNORMAL HIGH (ref 74–106)
Potassium: 3.8 MMOL/L (ref 3.5–5.1)
Sodium: 135 MMOL/L — ABNORMAL LOW (ref 136–145)

## 2008-05-06 LAB — CBC WITH AUTOMATED DIFF
EOSINOPHILS: 2 % (ref 1–8)
HCT: 31.5 % — ABNORMAL LOW (ref 35.6–45.0)
HGB: 10.8 g/dL — ABNORMAL LOW (ref 11.7–15.0)
LYMPHOCYTES: 30 % (ref 16–44)
MCH: 33 PG — ABNORMAL HIGH (ref 26.1–32.9)
MCHC: 34.3 g/dL (ref 31.4–35.0)
MCV: 96.1 FL (ref 79.6–97.8)
MONOCYTES: 9 % (ref 3–9)
MPV: 7.9 FL (ref 7.4–10.4)
NEUTROPHILS: 59 % (ref 47–75)
PLATELET COMMENTS: ADEQUATE
PLATELET: 187 10*3/uL (ref 140–440)
RBC: 3.28 M/uL — ABNORMAL LOW (ref 3.86–5.18)
RDW: 13.6 % (ref 11.9–14.6)
WBC: 7.9 10*3/uL (ref 4.5–10.5)

## 2008-05-06 LAB — POC URINE DIPSTICK MANUAL
Glucose, urine (POC): NEGATIVE
Ketones (POC): NEGATIVE
Protein (POC): NEGATIVE

## 2008-05-06 MED ADMIN — nalbuphine (NUBAIN) injection 10 mg: INTRAMUSCULAR | @ 01:00:00 | NDC 63481043210

## 2008-05-08 ENCOUNTER — Inpatient Hospital Stay
Admit: 2008-05-08 | Discharge: 2008-05-11 | Disposition: A | Discharge status: Home / Self Care | Source: Home / Self Care

## 2008-05-08 DIAGNOSIS — O34219 Maternal care for unspecified type scar from previous cesarean delivery: Secondary | ICD-10-CM

## 2008-05-08 LAB — TYPE AND SCREEN
ABO/Rh: A POS
Antibody Screen: NEGATIVE

## 2008-05-08 LAB — CBC WITH AUTOMATED DIFF
ABS. BASOPHILS: 0 10*3/uL (ref 0.0–0.2)
ABS. EOSINOPHILS: 0 10*3/uL (ref 0.00–0.80)
ABS. LYMPHOCYTES: 1.7 10*3/uL (ref 0.6–4.3)
ABS. MONOCYTES: 0.8 10*3/uL (ref 0.1–0.9)
ABS. NEUTROPHILS: 9 10*3/uL — ABNORMAL HIGH (ref 1.9–7.8)
BASOPHILS: 0 % — ABNORMAL LOW (ref 0.1–1.6)
EOSINOPHILS: 0 % — ABNORMAL LOW (ref 0.5–7.8)
HCT: 33.8 % — ABNORMAL LOW (ref 35.6–45.0)
HGB: 11.3 g/dL — ABNORMAL LOW (ref 11.7–15.0)
LYMPHOCYTES: 14 % — ABNORMAL LOW (ref 14.7–41.3)
MCH: 32.2 PG (ref 26.1–32.9)
MCHC: 33.6 g/dL (ref 31.4–35.0)
MCV: 96 FL (ref 79.6–97.8)
MONOCYTES: 7 % (ref 3.2–9.0)
MPV: 7.9 FL (ref 7.4–10.4)
NEUTROPHILS: 79 % — ABNORMAL HIGH (ref 47.0–74.6)
PLATELET: 176 10*3/uL (ref 140–440)
RBC: 3.52 M/uL — ABNORMAL LOW (ref 3.86–5.18)
RDW: 13.6 % (ref 11.9–14.6)
WBC: 11.5 10*3/uL — ABNORMAL HIGH (ref 4.5–10.5)

## 2008-05-08 LAB — TYPE & SCREEN
ABO/Rh(D): A POS
Antibody screen: NEGATIVE

## 2008-05-08 MED ADMIN — citric acid-sodium citrate (BICITRA) 500-334 mg/5 mL solution 30 mL: ORAL | @ 08:00:00

## 2008-05-08 MED ADMIN — dextrose 5% lactated ringers infusion: INTRAVENOUS | @ 21:00:00 | NDC 82468011674

## 2008-05-08 MED ADMIN — cefazolin (ANCEF) 1 g IVPB: INTRAVENOUS | @ 21:00:00 | NDC 63323023765

## 2008-05-08 MED ADMIN — famotidine (PF) (PEPCID) injection 20 mg: INTRAVENOUS | @ 08:00:00 | NDC 42852084660

## 2008-05-08 MED ADMIN — morphine injection 2 mg: INTRAVENOUS | @ 12:00:00 | NDC 76045000411

## 2008-05-08 MED ADMIN — lactated ringers infusion 1,000 mL: INTRAVENOUS | @ 08:00:00 | NDC 11845118709

## 2008-05-08 MED ADMIN — nalbuphine (NUBAIN) injection 10 mg: INTRAMUSCULAR | @ 07:00:00 | NDC 63481043210

## 2008-05-08 MED ADMIN — morphine injection 2 mg: INTRAVENOUS | @ 19:00:00 | NDC 76045000411

## 2008-05-08 MED ADMIN — promethazine (PHENERGAN) injection 12.5 mg: INTRAVENOUS | @ 12:00:00 | NDC 60977000143

## 2008-05-08 MED ADMIN — dextrose 5% lactated ringers infusion: INTRAVENOUS | @ 13:00:00 | NDC 82468011674

## 2008-05-08 MED ADMIN — ketorolac (TORADOL) injection 30 mg: INTRAVENOUS | @ 21:00:00 | NDC 54868384300

## 2008-05-08 MED ADMIN — cefazolin (ANCEF) 1 g IVPB: INTRAVENOUS | @ 12:00:00 | NDC 63323023765

## 2008-05-08 MED ADMIN — metoclopramide (REGLAN) injection 10 mg: INTRAVENOUS | @ 08:00:00 | NDC 54569337600

## 2008-05-08 MED ADMIN — penicillin g potassium (PFIZERPEN) 5 MU IVPB: INTRAVENOUS | @ 08:00:00 | NDC 67253020220

## 2008-05-08 NOTE — Procedures (Signed)
CESAREAN SECTION OPERATIVE REPORT    OPERATIVE REPORT : This is a report of operation performed on Denise Turner on 05/08/2008 at [facility].     DATE OF SURGERY: 05/08/2008    MEDICAL RECORD NUMBER: [id number]   PREOPERATIVE DIAGNOSIS:  IUP at term in Labor, Hx of previous C-Section X 2, patient desires elective repeat C-section.  POSTOPERATIVE DIAGNOSIS: Same.    OPERATIVE PROCEDURE: Procedure(s):  CESAREAN SECTION   Surgeon(s) and Role:     * Kaleen Odea, MD - Primary  ASSISTANT(S): Natale Milch, RN - Circ-1  Lavella Lemons, RN - Scrub Tech-1  Efraim Kaufmann Zonia Kief - Scrub Tech-2        ANESTHESIA TYPE: Spinal   ESTIMATED BLOOD LOSS: Estimated blood loss was about 900 cc.    COMPLICATIONS: None  FINDINGS: Delivery of a viable female baby on 05/08/2008  at  . Placenta delivered at  .   Information for the patient's newborn:  Halana, Deisher [[id number]]     Sex: Female  One Minute Apgar: 9   Five  Minute Apgar: 9   Birth Weight: 6 lb 9 oz (2977 g)  Birth Length: 19.69" (50 cm)    The operative report reads as under.   The patient was taken to the operating room and was placed in supine position. Preoperative labs and consent were verified.  An indwelling urethral catheter was placed in bladder for continuous bladder drainage.    Patient was prepped and draped in sterile fashion.  The operation was begun by using a a Pfannenstiel type of incision. The incision was low transverse, slightly curvilinear at the level of pubic hair line, that extended somewhat beyond the lateral border of the rectus muscles. The fascia was nicked in midline and then incised transversely for full length of incision with curved Mayo scissors. The superior and inferior aspects of fascial incision were grasped with kocher clamps, elevated and underlying rectus muscles were dissected off bluntly. The peritoneum was tented up and opened sharply with Metzenbaum scissors. The peritoneal cavity was entered by extending  peritoneal incision.  Meticulous hemostasis was observed.    After entering abdominal cavity, uterus was quickly but carefully palpated to identify the size and presenting part of the fetus and any uterine rotation was corrected at this time. The loose reflection of the peritoneum above the upper margin of the bladder and overlying the anterior lower uterine segment was grasped in the midline with the forceps and incised with the Metzenbaum scissors. This incision was extended laterally and the bladder flap was created digitally. The bladder flap was held downwards using a bladder retractor.   The uterus was opened through the lower uterine segment, about 2 cms. above the detached bladder. The uterine incision was made with scalpel through the exposed lower uterine segment transversely. This was done carefully, so as to cut completely through the uterine wall but not deeply enough to wound the underlying fetus. Suctioning of the operative field was done by an Geophysicist/field seismologist. The uterine incision was then extended by cutting laterally and slightly upwards with bandage scissors.   The retractor was removed and a hand was slipped in to the uterine cavity between symphysis pubis and fetal head. The head was gently elevated with the fingers and palm. Through the incision, the exposed nares and mouth were aspirated with a bulb syringe before thorax was delivered. The shoulders were then delivered using gentle traction plus fundal pressure. The rest of the body followed spontaneously.  Baby cried immediately.   The cord was clamped between two clamps and cut, with the infant held at the level of the abdominal wall. The infant was given to [facility] team for further resuscitative efforts. A sample for cord blood evaluation were obtained from the placental end of the cord. The placenta was expressed and delivered. Fundal massage was done and pitocin was infused via dilute infusion to aid decreased bleeding.   Two cut ends of the uterine  wall were secured with Allies clamps. Uterus was closed in two layers with #0 chromic catgut on round needle in continuous running - lock fashion. The visceral peritoneum was approximated using #00 chromic catgut in continuous running - lock fashion.    Abdominal cavity was cleaned with two packs and all the blood clots were removed. The cavity was irrigated. The parietal peritoneum was closed using #00 chromic catgut on a round needle in continuous running fashion. The fascia was sutured using 0 Vicryl in continuous fashion. Skin was approximated with staples. Sterile dressing was applied at the site of surgery.   Sponges, laps, instruments, and needle count was correct times two.  Urine was clear at the end of the surgery in foley bag.   Patient seemed to tolerate surgery and anesthesia very well without any complications.  The patient was transferred to recovery room in stable condition.     Signed By: Kaleen Odea, M.D.             May 08, 2008

## 2008-05-08 NOTE — Progress Notes (Signed)
Dr. Nolen Mu at the bedside.

## 2008-05-08 NOTE — Procedures (Signed)
CESAREAN SECTION OPERATIVE REPORT    OPERATIVE REPORT : This is a report of operation performed on Denise Turner on 05/08/2008 at [facility].     DATE OF SURGERY: 05/08/2008    MEDICAL RECORD NUMBER: [id number]   PREOPERATIVE DIAGNOSIS:  IUP at term in Labor, Hx of previous C-Section X 2, patient desires elective repeat C-section.  POSTOPERATIVE DIAGNOSIS: Same.    OPERATIVE PROCEDURE: Procedure(s):  CESAREAN SECTION   Surgeon(s) and Role:     * Kaleen Odea, MD - Primary  ASSISTANT(S): Natale Milch, RN - Circ-1  Lavella Lemons, RN - Scrub Tech-1  Efraim Kaufmann Zonia Kief - Scrub Tech-2        ANESTHESIA TYPE: Spinal   ESTIMATED BLOOD LOSS: Estimated blood loss was about 900 cc.    COMPLICATIONS: None  FINDINGS: Delivery of a viable female baby on 05/08/2008  at  . Placenta delivered at  .   Information for the patient's newborn:  Neita, Landrigan [[id number]]     Sex: Female  One Minute Apgar: 9   Five  Minute Apgar: 9   Birth Weight: 6 lb 9 oz (2977 g)  Birth Length: 19.69" (50 cm)    The operative report reads as under.   The patient was taken to the operating room and was placed in supine position. Preoperative labs and consent were verified.  An indwelling urethral catheter was placed in bladder for continuous bladder drainage.    Patient was prepped and draped in sterile fashion.   The operation was begun by using a a Pfannenstiel type of incision. The incision was low transverse, slightly curvilinear at the level of pubic hair line, that extended somewhat beyond the lateral border of the rectus muscles. The fascia was nicked in midline and then incised transversely for full length of incision with curved Mayo scissors. The superior and inferior aspects of fascial incision were grasped with kocher clamps, elevated and underlying rectus muscles were dissected off bluntly. The peritoneum was tented up and opened sharply with Metzenbaum scissors. The peritoneal cavity was entered by extending peritoneal incision.  Meticulous hemostasis was observed.    After entering abdominal cavity, uterus was quickly but carefully palpated to identify the size and presenting part of the fetus and any uterine rotation was corrected at this time. The loose reflection of the peritoneum above the upper margin of the bladder and overlying the anterior lower uterine segment was grasped in the midline with the forceps and incised with the Metzenbaum scissors. This incision was extended laterally and the bladder flap was created digitally. The bladder flap was held downwards using a bladder retractor.   The uterus was opened through the lower uterine segment, about 2 cms. above the detached bladder. The uterine incision was made with scalpel through the exposed lower uterine segment transversely. This was done carefully, so as to cut completely through the uterine wall but not deeply enough to wound the underlying fetus. Suctioning of the operative field was done by an Geophysicist/field seismologist. The uterine incision was then extended by cutting laterally and slightly upwards with bandage scissors.    The retractor was removed and a hand was slipped in to the uterine cavity between symphysis pubis and fetal head. The head was gently elevated with the fingers and palm. Through the incision, the exposed nares and mouth were aspirated with a bulb syringe before thorax was delivered. The shoulders were then delivered using gentle traction plus fundal pressure. The rest of the body followed  spontaneously. Baby cried immediately.   The cord was clamped between two clamps and cut, with the infant held at the level of the abdominal wall. The infant was given to [facility] team for further resuscitative efforts. A sample for cord blood evaluation were obtained from the placental end of the cord. The placenta was expressed and delivered. Fundal massage was done and pitocin was infused via dilute infusion to aid decreased bleeding.   Two cut ends of the uterine wall were secured with Allies clamps. Uterus was closed in two layers with #0 chromic catgut on round needle in continuous running - lock fashion. The visceral peritoneum was approximated using #00 chromic catgut in continuous running - lock fashion.    Abdominal cavity was cleaned with two packs and all the blood clots were removed. The cavity was irrigated. The parietal peritoneum was closed using #00 chromic catgut on a round needle in continuous running fashion. The fascia was sutured using 0 Vicryl in continuous fashion. Skin was approximated with staples. Sterile dressing was applied at the site of surgery.   Sponges, laps, instruments, and needle count was correct times two.  Urine was clear at the end of the surgery in foley bag.   Patient seemed to tolerate surgery and anesthesia very well without any complications.  The patient was transferred to recovery room in stable condition.     Signed By: Kaleen Odea, M.D.             May 08, 2008

## 2008-05-08 NOTE — Progress Notes (Signed)
TRANSFER - IN REPORT:    Verbal report received from  [facility], Rn (name) on Denise Turner  being received from LDR(unit) for routine progression of care      Report consisted of patient???s Situation, Background, Assessment and   Recommendations(SBAR).     Information from the following report(s) Kardex was reveiwed with the receiving nurse.    Opportunity for questions and clarification was provided.      Assessment completed upon patient???s arrival to unit and care assumed.

## 2008-05-08 NOTE — Progress Notes (Signed)
Patient care assumed from [facility], Charity fundraiser. Bedside Report received.

## 2008-05-08 NOTE — Progress Notes (Signed)
Group Beta Strep Positive/Unknown Result    The mother's Group Beta Strep result is positive. She has received 1 dose of 5mu penicillin. Last dose given at 0400. Membranes intact prior to cesarean section.

## 2008-05-08 NOTE — Progress Notes (Signed)
Not much relief with Nubain, Dr. Samuella Cota notified, orders.  Pt. Informed of plan of care.  Area above perineum shaved, FC placed with ease and clear yellow urine returns.  Anesthesia to se pt.

## 2008-05-08 NOTE — Progress Notes (Signed)
Bedside report given.  Patient care assumed from [facility].  Patient states pain is 4/10 but denies the need for any medication.  Voided 200 ml urine since foley out.  Patient is aware of pain medications that are available.  Ice pitcher refilled.

## 2008-05-08 NOTE — Progress Notes (Signed)
Patient denies any discomfort.

## 2008-05-08 NOTE — Progress Notes (Signed)
TRANSFER - OUT REPORT:    Verbal report given to casey boltz(name) on Denise Turner  being transferred to mi(unit) for routine progression of care       Report consisted of patient???s Situation, Background, Assessment and   Recommendations(SBAR).     Information from the following report(s) Kardex was reveiwed with the receiving nurse.    Opportunity for questions and clarification was provided.

## 2008-05-08 NOTE — Progress Notes (Signed)
To OR

## 2008-05-08 NOTE — Progress Notes (Signed)
Oxycodone 10mg  po given for 8/10 incisional pain.

## 2008-05-08 NOTE — Progress Notes (Signed)
Pt came in through EMS, crying and thrashing.  To bed and assessment completed, Dr. Samuella Cota notified of status.  Orders.

## 2008-05-08 NOTE — Progress Notes (Signed)
Pt up to bathroom with assist from nurse. Peri care taught and pt verbalized understanding. Linens changed. PO fluids provided.

## 2008-05-08 NOTE — H&P (Signed)
History and Physical      Patient is 27 y.o [place] American female, G3P2    IUP at 49 4/7 weeka, came to [facility] in labor with uterine contractions.    Hx of previous c-section X 2, Lithotripsy and Kidney stone.    B-Strep +ve.    Plan: elective repeat C-section, anesthesia notified.    Prenatal records were reviewed and no changes were noted.

## 2008-05-09 DIAGNOSIS — O34219 Maternal care for unspecified type scar from previous cesarean delivery: Secondary | ICD-10-CM

## 2008-05-09 LAB — CBC WITH AUTOMATED DIFF
ABS. BASOPHILS: 0 10*3/uL (ref 0.0–0.2)
ABS. EOSINOPHILS: 0 10*3/uL (ref 0.00–0.80)
ABS. LYMPHOCYTES: 1.7 10*3/uL (ref 0.6–4.3)
ABS. MONOCYTES: 0.9 10*3/uL (ref 0.1–0.9)
ABS. NEUTROPHILS: 8.7 10*3/uL — ABNORMAL HIGH (ref 1.9–7.8)
BASOPHILS: 0 % — ABNORMAL LOW (ref 0.1–1.6)
EOSINOPHILS: 0 % — ABNORMAL LOW (ref 0.5–7.8)
HCT: 27.4 % — ABNORMAL LOW (ref 35.6–45.0)
HGB: 9.2 g/dL — ABNORMAL LOW (ref 11.7–15.0)
LYMPHOCYTES: 15 % (ref 14.7–41.3)
MCH: 32.5 PG (ref 26.1–32.9)
MCHC: 33.6 g/dL (ref 31.4–35.0)
MCV: 96.8 FL (ref 79.6–97.8)
MONOCYTES: 8 % (ref 3.2–9.0)
MPV: 7.5 FL (ref 7.4–10.4)
NEUTROPHILS: 77 % — ABNORMAL HIGH (ref 47.0–74.6)
PLATELET: 141 10*3/uL (ref 140–440)
RBC: 2.83 M/uL — ABNORMAL LOW (ref 3.86–5.18)
RDW: 13.9 % (ref 11.9–14.6)
WBC: 11.3 10*3/uL — ABNORMAL HIGH (ref 4.5–10.5)

## 2008-05-09 MED ADMIN — lactated ringers infusion: INTRAVENOUS | @ 07:00:00 | NDC 11845118709

## 2008-05-09 MED ADMIN — simethicone (MYLICON) tablet 80 mg: ORAL | @ 22:00:00 | NDC 94441022361

## 2008-05-09 MED ADMIN — guaifenesin SR (MUCINEX) tablet 1,200 mg: ORAL | @ 19:00:00 | NDC 67544009953

## 2008-05-09 MED ADMIN — hydrocodone-acetaminophen (LORTAB) 7.5-500 mg per tablet 1 Tab: ORAL | @ 13:00:00 | NDC 68387023090

## 2008-05-09 MED ADMIN — pseudoephedrine (SUDAFED) tablet 30 mg: ORAL | @ 19:00:00 | NDC 45565031201

## 2008-05-09 MED ADMIN — acetaminophen (TYLENOL) tablet 650 mg: ORAL | @ 15:00:00 | NDC 50580048790

## 2008-05-09 MED ADMIN — ampicillin-sulbactam (UNASYN) 1.5 g IVPB: INTRAVENOUS | @ 21:00:00 | NDC 00049002283

## 2008-05-09 MED ADMIN — oxycodone (ROXICODONE) tablet 10 mg: ORAL | @ 01:00:00 | NDC 72162174902

## 2008-05-09 MED ADMIN — ampicillin-sulbactam (UNASYN) 1.5 g IVPB: INTRAVENOUS | @ 14:00:00 | NDC 00049002283

## 2008-05-09 MED ADMIN — hydrocodone-acetaminophen (LORTAB) 7.5-500 mg per tablet 1 Tab: ORAL | @ 22:00:00 | NDC 68387023090

## 2008-05-09 MED ADMIN — ibuprofen (MOTRIN) tablet 400 mg: ORAL | @ 14:00:00 | NDC 76420057590

## 2008-05-09 MED ADMIN — ampicillin-sulbactam (UNASYN) 1.5 g IVPB: INTRAVENOUS | @ 08:00:00 | NDC 00049002283

## 2008-05-09 MED ADMIN — ibuprofen (MOTRIN) tablet 800 mg: ORAL | @ 22:00:00 | NDC 76420057790

## 2008-05-09 MED ADMIN — simethicone (MYLICON) tablet 80 mg: ORAL | @ 01:00:00 | NDC 94441022361

## 2008-05-09 MED ADMIN — ketorolac (TORADOL) injection 30 mg: INTRAVENOUS | @ 07:00:00 | NDC 54868384300

## 2008-05-09 NOTE — Progress Notes (Signed)
Temp retaken 98.7

## 2008-05-09 NOTE — Progress Notes (Signed)
Patient has elevated temp 100.5 and pulse 127.  No obvious signs of infection.  Dr Tacey Ruiz called.  Orders received.

## 2008-05-09 NOTE — Progress Notes (Signed)
Post-op dressing removed.  Incision well approximated with staples.  Patient complains of 7/10 incisional pain.  Toradol 30mg  IV given.  Lactated ringers at 125cc/hour per MD order.  Unasyn 1.5gm IVPB infusing per MD order.  Will check cbc at 0600.

## 2008-05-09 NOTE — Progress Notes (Signed)
Assessment complete.  Patient complains of abdominal cramping and incisional pain.  Lortab 1-tab po with motrin 800mg  po for pain.  Patient's lungs are clear with congested productive cough- thick green sputum.  Currently afebrile.  Mucinex 1200mg  po given per scheduled dose.  Unasyn 1.5gm IV infusing.

## 2008-05-09 NOTE — Progress Notes (Signed)
Temp 98.4 orally.  Patient denies discomfort.

## 2008-05-09 NOTE — Progress Notes (Signed)
Post-Operative Day Number 1 Progress Note    PNow on Unasyn for temperature elevation last night, post-op day 1 from cesarean delivery with  Additional complaints of purulent nasal drainage, sinus pressure, and productive cough  Pain controlled on current medication.  Voiding without difficulty, normal lochia.    Vitals:  Patient Vitals in the past 8 hrs:   BP Temp Temp src Pulse Resp SpO2 Height Wt - Scale   05/09/08 0308 116/61 mmHg 100.5 ??F (38.1 ??C) - 124  20  - - -       Temp (24hrs), Avg:99 ??F (37.2 ??C), Min:98.3 ??F (36.8 ??C), Max:100.5 ??F (38.1 ??C)      Vital signs as noted with postpartum fever  Exam:  Patient without distress.   HEENT R frontal sinus and maxillary sinus tenderness   Chest clear lungfields and unlabored respiratory efforts   CVAT absent               Abdomen soft, fundus firm at level of umbilicus, moderately tender.                 Incision dry and clean without erythema.  Staples in place.               Lower extremities are negative for swelling, cords or tenderness.    Labs: Recent Results (from the past 24 hour(s))   CBC W/ AUTOMATED DIFF    Collection Time 05/09/08  6:15 AM   Component Value Range   ??? WBC 11.3 (*) 4.5-10.5 (K/uL)   ??? RBC 2.83 (*) 3.86-5.18 (M/uL)   ??? HGB 9.2 (*) 11.7-15.0 (g/dL)   ??? HCT 27.4 (*) 35.6-45.0 (%)   ??? MCV 96.8  79.6-97.8 (FL)   ??? MCH 32.5  26.1-32.9 (PG)   ??? MCHC 33.6  31.4-35.0 (g/dL)   ??? RDW 13.9  11.9-14.6 (%)   ??? PLATELET 141  140-440 (K/uL)   ??? MPV 7.5  7.4-10.4 (FL)   ??? NEUTROPHILS 77 (*) 47.0-74.6 (%)   ??? LYMPHOCYTES 15  14.7-41.3 (%)   ??? MONOCYTES 8  3.2-9.0 (%)   ??? EOSINOPHILS 0 (*) 0.5-7.8 (%)   ??? BASOPHILS 0 (*) 0.1-1.6 (%)   ??? ABSOLUTE NEUTS 8.7 (*) 1.9-7.8 (K/UL)   ??? ABSOLUTE LYMPHS 1.7  0.6-4.3 (K/UL)   ??? ABSOLUTE MONOS 0.9  0.1-0.9 (K/UL)   ??? ABSOLUTE EOSINS 0.0  0.00-0.80 (K/UL)   ??? ABSOLUTE BASOS 0.0  0.0-0.2 (K/UL)   ??? DF AUTOMATED  -          Assessment and Plan:    Patient Active Problem List   Diagnoses Code    ??? Acute URI 465.9C   ??? Postpartum Fever 672.04B   ??? Anemia of Pregnancy 648.20B   ??? Previous Cesarean Delivery, Delivered 654.21C       Post-cesarean course complicated by fever, including sinusitis URI and fundal tenderness c/w endomyonmetritis.  Continue Unasyn  Add decongestant and Mucinex, incentive spirometry, and humidifier  Continue routine post-op care and maternal education.

## 2008-05-09 NOTE — Progress Notes (Signed)
Tylenol 650mg  po given for t 100.7

## 2008-05-09 NOTE — Progress Notes (Signed)
Sudafed 30mg  po per scheduled dose.

## 2008-05-09 NOTE — Progress Notes (Signed)
Motrin 400mg  po given for t 100.3

## 2008-05-09 NOTE — Progress Notes (Signed)
Patient denies any discomfort.

## 2008-05-09 NOTE — Progress Notes (Signed)
Bedside report given.  Patient care assumed from Meredith Edmunds RN.

## 2008-05-10 LAB — CBC WITH AUTOMATED DIFF
ABS. BASOPHILS: 0 10*3/uL (ref 0.0–0.2)
ABS. EOSINOPHILS: 0.1 10*3/uL (ref 0.00–0.80)
ABS. LYMPHOCYTES: 1.4 10*3/uL (ref 0.6–4.3)
ABS. MONOCYTES: 0.9 10*3/uL (ref 0.1–0.9)
ABS. NEUTROPHILS: 12.1 10*3/uL — ABNORMAL HIGH (ref 1.9–7.8)
BASOPHILS: 0 % — ABNORMAL LOW (ref 0.1–1.6)
EOSINOPHILS: 1 % (ref 0.5–7.8)
HCT: 27.5 % — ABNORMAL LOW (ref 35.6–45.0)
HGB: 9.2 g/dL — ABNORMAL LOW (ref 11.7–15.0)
LYMPHOCYTES: 10 % — ABNORMAL LOW (ref 14.7–41.3)
MCH: 32.5 PG (ref 26.1–32.9)
MCHC: 33.5 g/dL (ref 31.4–35.0)
MCV: 96.9 FL (ref 79.6–97.8)
MONOCYTES: 6 % (ref 3.2–9.0)
MPV: 6.6 FL — ABNORMAL LOW (ref 7.4–10.4)
NEUTROPHILS: 83 % — ABNORMAL HIGH (ref 47.0–74.6)
PLATELET: 185 10*3/uL (ref 140–440)
RBC: 2.84 M/uL — ABNORMAL LOW (ref 3.86–5.18)
RDW: 13.9 % (ref 11.9–14.6)
WBC: 14.5 10*3/uL — ABNORMAL HIGH (ref 4.5–10.5)

## 2008-05-10 MED ADMIN — pseudoephedrine (SUDAFED) tablet 30 mg: ORAL | @ 09:00:00 | NDC 45565031201

## 2008-05-10 MED ADMIN — hydrocodone-acetaminophen (LORTAB) 7.5-500 mg per tablet 1 Tab: ORAL | @ 18:00:00 | NDC 68387023090

## 2008-05-10 MED ADMIN — ampicillin-sulbactam (UNASYN) 1.5 g IVPB: INTRAVENOUS | @ 14:00:00 | NDC 00049002283

## 2008-05-10 MED ADMIN — ibuprofen (MOTRIN) tablet 800 mg: ORAL | @ 18:00:00 | NDC 76420057790

## 2008-05-10 MED ADMIN — pseudoephedrine (SUDAFED) tablet 30 mg: ORAL | @ 14:00:00 | NDC 45565031201

## 2008-05-10 MED ADMIN — simethicone (MYLICON) tablet 80 mg: ORAL | @ 21:00:00 | NDC 94441022361

## 2008-05-10 MED ADMIN — hydrocodone-acetaminophen (LORTAB) 7.5-500 mg per tablet 1 Tab: ORAL | @ 03:00:00 | NDC 68387023090

## 2008-05-10 MED ADMIN — hydrocodone-acetaminophen (LORTAB) 7.5-500 mg per tablet 1 Tab: ORAL | @ 09:00:00 | NDC 68387023090

## 2008-05-10 MED ADMIN — ibuprofen (MOTRIN) tablet 800 mg: ORAL | @ 03:00:00 | NDC 76420057790

## 2008-05-10 MED ADMIN — lactated ringers infusion: INTRAVENOUS | @ 14:00:00 | NDC 11845118709

## 2008-05-10 MED ADMIN — guaifenesin SR (MUCINEX) tablet 1,200 mg: ORAL | @ 16:00:00 | NDC 67544009953

## 2008-05-10 MED ADMIN — ampicillin-sulbactam (UNASYN) 1.5 g IVPB: INTRAVENOUS | @ 21:00:00 | NDC 00049002283

## 2008-05-10 MED ADMIN — pseudoephedrine (SUDAFED) tablet 30 mg: ORAL | @ 21:00:00 | NDC 45565031201

## 2008-05-10 MED ADMIN — pseudoephedrine (SUDAFED) tablet 30 mg: ORAL | @ 01:00:00 | NDC 45565031201

## 2008-05-10 MED ADMIN — ampicillin-sulbactam (UNASYN) 1.5 g IVPB: INTRAVENOUS | @ 03:00:00 | NDC 00049002283

## 2008-05-10 MED ADMIN — simethicone (MYLICON) tablet 80 mg: ORAL | @ 14:00:00 | NDC 94441022361

## 2008-05-10 MED ADMIN — hydrocodone-acetaminophen (LORTAB) 7.5-500 mg per tablet 1 Tab: ORAL | @ 14:00:00 | NDC 68387023090

## 2008-05-10 MED ADMIN — ampicillin-sulbactam (UNASYN) 1.5 g IVPB: INTRAVENOUS | @ 09:00:00 | NDC 00049002283

## 2008-05-10 MED ADMIN — guaifenesin SR (MUCINEX) tablet 1,200 mg: ORAL | @ 03:00:00 | NDC 67544009953

## 2008-05-10 MED ADMIN — simethicone (MYLICON) tablet 80 mg: ORAL | @ 03:00:00 | NDC 94441022361

## 2008-05-10 NOTE — Progress Notes (Signed)
Temp retaken, Temp 98.5, pt denies chills or feeling feverish, pt denies further needs, pt instructed to call RN if a change in symptoms occur

## 2008-05-10 NOTE — Progress Notes (Signed)
Pt. Sleeping.  Report from [employer].

## 2008-05-10 NOTE — Progress Notes (Signed)
Unasyn 1.5 gm IVPB per scheduled dose.  Sudafed 30mg  po per scheduled dose.  Lortab 7.5/500- 1tab po for 8/10 incisional pain.

## 2008-05-10 NOTE — Progress Notes (Signed)
PA lat CXR image reviewed.  No signs of pneumonia.

## 2008-05-10 NOTE — Progress Notes (Signed)
Patient denies pain now.

## 2008-05-10 NOTE — Progress Notes (Signed)
Report received from [facility], bedside report complete, pt care assumed

## 2008-05-10 NOTE — Progress Notes (Signed)
Pt. Taken to chest xray and back

## 2008-05-10 NOTE — Progress Notes (Signed)
Pt. Denies any needs

## 2008-05-10 NOTE — Progress Notes (Addendum)
Post-Operative Day Number 2 Progress Note    Patient with defervescing fever and productive purulent yellow-green sputum and nasal drainage on second day Unasyn now post-op day 2 from cesarean delivery without other significant complaints.  Pain controlled on current medication.  Voiding without difficulty, normal lochia.    Treatment meds include Unasyn, Mucinex, Sudafed for sinusitis and secondary bronchitis and cough.  She recalls treatment for outpatient pneumonia in the past.     Vitals:  Patient Vitals in the past 8 hrs:   BP Temp Temp src Pulse Resp SpO2 Height Wt - Scale   05/10/08 0913 166/60 mmHg 98.5 ??F (36.9 ??C) - 117  18  - - -       Temp (24hrs), Avg:98.4 ??F (36.9 ??C), Min:98.1 ??F (36.7 ??C), Max:98.6 ??F (37 ??C)      Vital signs stable, tmax 100.5 yesterday AM    Exam:  Patient c/o onset chills this afternoon.  Sinus pressure is improved with curent meds   HEENT Mild sinus tenderness   Chest No ronchi or wheezes; decreased breath sounds at L base               Abdomen soft, fundus firm at level of umbilicus, nontender.                Incision dry and clean without erythema.               Lower extremities are negative for swelling, cords or tenderness.    Labs:   Lab Results   Component Value Date/Time   ??? WBC 11.3 05/09/08  6:15 AM   ??? HGB 9.2 05/09/08  6:15 AM   ??? HCT 27.4 05/09/08  6:15 AM   ??? PLATELET 141 05/09/08  6:15 AM   ??? MCV 96.8 05/09/08  6:15 AM           Assessment and Plan:   Patient Active Problem List   Diagnoses Code   ??? Acute URI 465.9C   ??? Postpartum Fever 672.04B   ??? Anemia of Pregnancy 648.20B   ??? Previous Cesarean Delivery, Delivered 654.21C         Post-cesarean course complicated by fever and URI.    Given hx pneumonia and decreased LLL BS, will check PA LAT CXR and consider addition of antibiotic a/o hospitalist consultation  Recheck WBC today  Continue routine post-op care and maternal education.

## 2008-05-10 NOTE — Progress Notes (Signed)
Denies any needs

## 2008-05-10 NOTE — Progress Notes (Signed)
Patient denies any discomfort.

## 2008-05-10 NOTE — Progress Notes (Signed)
Infant complain of chills, Temp taken 99.1, pt encouraged to drink fluids Temp retaken in 30 mins, Temp 100.0, Tylenol given

## 2008-05-11 MED ORDER — HYDROCODONE-ACETAMINOPHEN 7.5 MG-500 MG TAB
ORAL_TABLET | ORAL | Status: DC | PRN
Start: 2008-05-11 — End: 2008-07-18

## 2008-05-11 MED ORDER — IBUPROFEN 800 MG TAB
800 mg | ORAL_TABLET | Freq: Four times a day (QID) | ORAL | Status: DC | PRN
Start: 2008-05-11 — End: 2008-07-18

## 2008-05-11 MED ADMIN — ibuprofen (MOTRIN) tablet 800 mg: ORAL | NDC 76420057790

## 2008-05-11 MED ADMIN — ampicillin-sulbactam (UNASYN) 1.5 g IVPB: INTRAVENOUS | @ 14:00:00 | NDC 00049002283

## 2008-05-11 MED ADMIN — ibuprofen (MOTRIN) tablet 800 mg: ORAL | @ 13:00:00 | NDC 76420057790

## 2008-05-11 MED ADMIN — acetaminophen (TYLENOL) tablet 650 mg: ORAL | @ 03:00:00 | NDC 50580048790

## 2008-05-11 MED ADMIN — pseudoephedrine (SUDAFED) tablet 30 mg: ORAL | @ 14:00:00 | NDC 45565031201

## 2008-05-11 MED ADMIN — lactated ringers infusion: INTRAVENOUS | @ 03:00:00 | NDC 11845118709

## 2008-05-11 MED ADMIN — ampicillin-sulbactam (UNASYN) 1.5 g IVPB: INTRAVENOUS | @ 03:00:00 | NDC 00049002283

## 2008-05-11 MED ADMIN — ampicillin-sulbactam (UNASYN) 1.5 g IVPB: INTRAVENOUS | @ 08:00:00 | NDC 00049002283

## 2008-05-11 MED ADMIN — pseudoephedrine (SUDAFED) tablet 30 mg: ORAL | @ 03:00:00 | NDC 45565031201

## 2008-05-11 MED ADMIN — hydrocodone-acetaminophen (LORTAB) 7.5-500 mg per tablet 1 Tab: ORAL | @ 13:00:00 | NDC 68387023090

## 2008-05-11 MED ADMIN — guaifenesin SR (MUCINEX) tablet 1,200 mg: ORAL | @ 04:00:00 | NDC 67544009953

## 2008-05-11 MED ADMIN — simethicone (MYLICON) tablet 80 mg: ORAL | @ 13:00:00 | NDC 94441022361

## 2008-05-11 MED ADMIN — guaifenesin SR (MUCINEX) tablet 1,200 mg: ORAL | @ 13:00:00 | NDC 67544009953

## 2008-05-11 MED ADMIN — hydrocodone-acetaminophen (LORTAB) 7.5-500 mg per tablet 1 Tab: ORAL | NDC 68387023090

## 2008-05-11 MED ADMIN — pseudoephedrine (SUDAFED) tablet 30 mg: ORAL | @ 08:00:00 | NDC 45565031201

## 2008-05-11 NOTE — Progress Notes (Signed)
Discharge instructions, follow-up appointment and prescriptions given and explained, questions encouraged and answered. Pt. Verbalized understanding.

## 2008-05-11 NOTE — Progress Notes (Signed)
Pt. Left MI unit walking with RN carrying infant in car seat, pt. In front seat of private automobile for transport home.

## 2008-05-11 NOTE — Progress Notes (Signed)
Report received from Ashley Ellis; assumed pt. Care. Bed-side report completed.

## 2008-05-11 NOTE — Discharge Summary (Signed)
.    Obstetrical Discharge Summary     Patient ID:  Denise Turner  [id number]  27 y.o.  April 23, 1981    Admit Date: 05/08/2008    Discharge Date: 05/11/2008     Admitting Physician: Kaleen Odea, MD     Admission Diagnoses: C SECTION    Discharge Diagnoses: Patient Active Hospital Problem List:  *Previous Cesarean Delivery, Delivered (05/09/2008)    Acute URI (05/09/2008)    Postpartum Fever (05/09/2008)    Anemia of Pregnancy (05/09/2008)      Admission Condition: Good    Discharge Condition: Good    Significant Diagnostic Studies: None            Hospital Course:     Delivery Date: 05/08/08    Delivery Time: 0429    Obstetrician: Dr. Samuella Cota           Sex: Female    Delivery Summary Single Birth  Type of Delivery: Cesarean, single birth  C-Section Delivery: Repeat;Without labor  Number of Vessels: 3  VBAC: No  If No, Reason: Not attempted  Estimated Blood Loss: 900 ml  Meconium Stained: No  Baby Suctioned: Yes (comment)  Events: None     Information for the patient's newborn:  Meryl, Hubers Boy [[id number]]   <BR>  One Minute Apgar: 9 <BR>  Five  Minute Apgar: 9       Immunizations:    There is no immunization history on file for this patient.    Group Beta Strep: Lab Results   Component Value Date/Time   ??? GrBS POS 04/09/08          Patient Instructions:   START taking these medications       hydrocodone-acetaminophen (LORTAB) 7.5-500 mg per tablet    Take 1 Tab by mouth every four (4) hours as needed for Pain.    Qty: 20 Tab Refills: 0        ibuprofen (MOTRIN) 800 mg tablet    Take 1 Tab by mouth every six (6) hours as needed for Pain.    Qty: 30 Tab Refills: 0          CONTINUE these medications which have NOT CHANGED       PRENATAL VIT 15/IRON CB/FA/DSS (PRENATAL AD PO)    take  by mouth.          STOP taking these medications       TYLENOL EXTRA STRENGTH PO    Comments:     Reason for Stopping:               See discharge instructions provided by nursing.    Follow-up Appointments   Procedure    ??? Follow up visit     Order Specific Question:  APPOINTMENT IN     Answer:  Two Weeks          Signed:  Kaleen Odea, MD  05/11/2008  2:05 PM

## 2008-05-11 NOTE — Progress Notes (Signed)
Post-Operative Day Number 3 Progress/Discharge Note    Patient doing well post-op day 3 from cesarean delivery without significant complaints.  Pain controlled on current medication.  Voiding without difficulty, normal lochia.    Vitals:  Patient Vitals in the past 8 hrs:   BP Temp Temp src Pulse Resp SpO2 Height Wt - Scale   05/11/08 0830 122/67 mmHg 99.2 ??F (37.3 ??C) - 111  18  - - -       Temp (24hrs), Avg:98.9 ??F (37.2 ??C), Min:97.8 ??F (36.6 ??C), Max:100.1 ??F (37.8 ??C)      Vital signs stable, afebrile.    Exam:  Patient without distress.               Abdomen soft, fundus firm at level of umbilicus, nontender.  Incision dry and                      clean without erythema.               Lower extremities are negative for swelling, cords or tenderness.    Labs: Recent Results (from the past 24 hour(s))   CBC W/ AUTOMATED DIFF    Collection Time 05/10/08  2:15 PM   Component Value Range   ??? WBC 14.5 (*) 4.5-10.5 (K/uL)   ??? RBC 2.84 (*) 3.86-5.18 (M/uL)   ??? HGB 9.2 (*) 11.7-15.0 (g/dL)   ??? HCT 27.5 (*) 35.6-45.0 (%)   ??? MCV 96.9  79.6-97.8 (FL)   ??? MCH 32.5  26.1-32.9 (PG)   ??? MCHC 33.5  31.4-35.0 (g/dL)   ??? RDW 13.9  11.9-14.6 (%)   ??? PLATELET 185  140-440 (K/uL)   ??? MPV 6.6 (*) 7.4-10.4 (FL)   ??? NEUTROPHILS 83 (*) 47.0-74.6 (%)   ??? LYMPHOCYTES 10 (*) 14.7-41.3 (%)   ??? MONOCYTES 6  3.2-9.0 (%)   ??? EOSINOPHILS 1  0.5-7.8 (%)   ??? BASOPHILS 0 (*) 0.1-1.6 (%)   ??? ABSOLUTE NEUTS 12.1 (*) 1.9-7.8 (K/UL)   ??? ABSOLUTE LYMPHS 1.4  0.6-4.3 (K/UL)   ??? ABSOLUTE MONOS 0.9  0.1-0.9 (K/UL)   ??? ABSOLUTE EOSINS 0.1  0.00-0.80 (K/UL)   ??? ABSOLUTE BASOS 0.0  0.0-0.2 (K/UL)   ??? DF AUTOMATED  -          Assessment and Plan:  Patient appears to be having uncomplicated post-cesarean course.  Continue routine post-op care and maternal education.  Plan discharge for today with follow up in our office in 2 weeks. No sinus related complaints.

## 2008-07-18 LAB — CBC WITH AUTOMATED DIFF
BAND NEUTROPHILS: 7 % — ABNORMAL HIGH (ref 0–6)
HCT: 35.2 % — ABNORMAL LOW (ref 35.6–45.0)
HGB: 11.9 g/dL (ref 11.7–15.0)
LYMPHOCYTES: 14 % — ABNORMAL LOW (ref 16–44)
MCH: 31.1 PG (ref 26.1–32.9)
MCHC: 33.8 g/dL (ref 31.4–35.0)
MCV: 91.8 FL (ref 79.6–97.8)
MONOCYTES: 9 % (ref 3–9)
MPV: 8 FL (ref 7.4–10.4)
NEUTROPHILS: 70 % (ref 47–75)
PLATELET COMMENTS: ADEQUATE
PLATELET: 193 10*3/uL (ref 140–440)
RBC: 3.83 M/uL — ABNORMAL LOW (ref 3.86–5.18)
RDW: 14.2 % (ref 11.9–14.6)
WBC: 15.2 10*3/uL — ABNORMAL HIGH (ref 4.5–10.5)

## 2008-07-18 LAB — HCG URINE, QL. - POC: Pregnancy test,urine (POC): NEGATIVE

## 2008-07-18 LAB — URINE MICROSCOPIC
Casts: 0 /LPF
Crystals, urine: 0 /LPF
Mucus: 0 /LPF
WBC: >100 /HPF

## 2008-07-18 MED ORDER — PROMETHAZINE 25 MG TAB
25 mg | ORAL_TABLET | Freq: Four times a day (QID) | ORAL | Status: AC | PRN
Start: 2008-07-18 — End: 2008-07-25

## 2008-07-18 MED ORDER — HYDROCODONE-ACETAMINOPHEN 5 MG-500 MG TAB
5-500 mg | ORAL_TABLET | ORAL | Status: AC | PRN
Start: 2008-07-18 — End: 2008-07-25

## 2008-07-18 MED ORDER — CIPROFLOXACIN 500 MG TAB
500 mg | ORAL_TABLET | Freq: Two times a day (BID) | ORAL | Status: AC
Start: 2008-07-18 — End: 2008-07-25

## 2008-07-18 MED ADMIN — ketorolac (TORADOL) injection 30 mg: INTRAVENOUS | @ 22:00:00 | NDC 00409379501

## 2008-07-18 MED ADMIN — levofloxacin (LEVAQUIN) tablet 750 mg: ORAL | @ 23:00:00 | NDC 00045152010

## 2008-07-18 MED ADMIN — sodium chloride 0.9 % bolus infusion 2,000 mL: INTRAVENOUS | @ 22:00:00 | NDC 00409798309

## 2008-07-18 MED ADMIN — acetaminophen (TYLENOL) tablet 1,000 mg: ORAL | @ 21:00:00 | NDC 51645070610

## 2008-07-18 MED FILL — ACETAMINOPHEN 500 MG TAB: 500 mg | ORAL | Qty: 1

## 2008-07-18 MED FILL — LEVAQUIN 250 MG TABLET: 250 mg | ORAL | Qty: 3

## 2008-07-18 MED FILL — ACETAMINOPHEN 500 MG TAB: 500 mg | ORAL | Qty: 2

## 2008-07-18 MED FILL — KETOROLAC TROMETHAMINE 30 MG/ML INJECTION: 30 mg/mL (1 mL) | INTRAMUSCULAR | Qty: 1

## 2008-07-18 NOTE — ED Notes (Signed)
Pt verbalizes understanding of discharge teaching

## 2008-07-18 NOTE — ED Notes (Signed)
2nd liter NS infusing as per order at this time.

## 2008-07-18 NOTE — ED Notes (Signed)
pt c/o +generalized body aches, fever, cough, congestion, nausea.

## 2008-07-18 NOTE — ED Notes (Signed)
Recheck.  Fever & tachycardia resolved.   Pt clinically improved.

## 2008-07-18 NOTE — ED Provider Notes (Signed)
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   Some burning cp after emesis.  No diarrhea, cough, congestion, sob.    No OTC meds tried.  PCP = [facility].    Patient is a 28 y.o. female presenting with General Illness. The history is provided by the patient.   Generalized Body Aches  Associated symptoms include abdominal pain. Pertinent negatives include no chest pain, no headaches and no shortness of breath.        Past Medical History   Diagnosis Date   ??? Other Ill-Defined Conditions      kidney stones   ??? Chronic Kidney Disease      kidney stones   ??? Postpartum Fever 05/09/2008   ??? Anemia of Pregnancy 05/09/2008   ??? Previous Cesarean Delivery, Delivered 05/09/2008          Past Surgical History   Procedure Date   ??? Hx cesarean section      x2   ??? Hx heent      tonsil   ??? Cesarean delivery only    ??? Hx other surgical      lithotripsy           No family history on file.     History   Social History   ??? Marital Status: Single     Spouse Name: N/A     Number of Children: N/A   ??? Years of Education: N/A   Occupational History   ??? Not on file.   Social History Main Topics   ??? Tobacco Use: Never   ??? Alcohol Use: No   ??? Drug Use: No   ??? Sexually Active: Yes -- Female partner(s)     Birth Control/ Protection: None   Other Topics Concern   ??? Not on file   Social History Narrative   ??? No narrative on file           ALLERGIES: Meperidine      Review of Systems   Constitutional: Positive for fever, diaphoresis, activity change and appetite change.   HENT: Negative for ear pain, congestion, sore throat and neck pain.    Respiratory: Positive for chest tightness. Negative for cough and shortness of breath.    Cardiovascular: Negative for chest pain.   Gastrointestinal: Positive for nausea, vomiting and abdominal pain. Negative for diarrhea.    Genitourinary: Positive for frequency, flank pain, decreased urine volume, difficulty urinating and pelvic pain. Negative for dysuria, vaginal bleeding and vaginal discharge.   Musculoskeletal: Positive for myalgias.   Neurological: Positive for dizziness and light-headedness. Negative for headaches.   All other systems reviewed and are negative.        Filed Vitals:    07/18/2008  2:59 PM 07/18/2008  3:28 PM   BP: 102/73    Pulse: 134    Temp: 102.5 ??F (39.2 ??C) 104.6 ??F (40.3 ??C)   Resp: 20    Height: 5\' 8"  (1.727 m)    SpO2: 99%               Physical Exam   Vitals reviewed.  Constitutional: She appears well-developed. No distress.   HENT:   Head: Normocephalic.        Tacky MMs   Eyes: No scleral icterus.   Neck: Neck supple.   Cardiovascular: Regular rhythm and normal heart sounds.         Tachy     Pulmonary/Chest:  Effort normal and breath sounds normal.   Abdominal: Soft. She exhibits no distension. Bowel sounds are decreased. Tenderness (nonfocal) is present. She has CVA tenderness (bilateral). She has no rigidity, no guarding, no pain at McBurney's point and no Murphy's sign.   Musculoskeletal: She exhibits no edema.   Neurological: She is alert.   Skin: She is diaphoretic.   Psychiatric: Her mood appears anxious.            MDM Coding   Interpretation: labs      Room Air Oxygen saturation normal, no intervention necessary    UA = >100 wbc, 3+ bact    CBC = wbc 15.2 w/ 7% bands    HCG = neg      Procedures

## 2008-08-13 LAB — CBC WITH AUTOMATED DIFF
ABS. BASOPHILS: 0 10*3/uL (ref 0.0–0.2)
ABS. EOSINOPHILS: 0 10*3/uL (ref 0.0–0.8)
ABS. IMM. GRANS.: 0 10*3/uL (ref 0.0–2.0)
ABS. LYMPHOCYTES: 0.9 10*3/uL (ref 0.5–4.6)
ABS. MONOCYTES: 1.7 10*3/uL — ABNORMAL HIGH (ref 0.1–1.3)
ABS. NEUTROPHILS: 9.9 10*3/uL — ABNORMAL HIGH (ref 1.7–8.2)
BASOPHILS: 0 % (ref 0.0–2.0)
EOSINOPHILS: 0 % — ABNORMAL LOW (ref 0.5–7.8)
HCT: 38.5 % (ref 37.6–48.3)
HGB: 12.9 g/dL (ref 11.7–15.0)
IMMATURE GRANULOCYTES: 0 % (ref 0.0–2.0)
LYMPHOCYTES: 7 % — ABNORMAL LOW (ref 13–44)
MCH: 29.9 PG (ref 26.1–32.9)
MCHC: 33.5 g/dL (ref 31.4–35.0)
MCV: 89.1 FL (ref 79.6–97.8)
MONOCYTES: 13 % — ABNORMAL HIGH (ref 4.0–12.0)
MPV: 10.1 FL — ABNORMAL LOW (ref 10.8–14.1)
NEUTROPHILS: 80 % — ABNORMAL HIGH (ref 43–78)
PLATELET: 179 10*3/uL (ref 140–440)
RBC: 4.32 M/uL (ref 3.86–5.18)
RDW: 14.7 % — ABNORMAL HIGH (ref 11.9–14.6)
WBC: 12.5 10*3/uL — ABNORMAL HIGH (ref 4.0–10.5)

## 2008-08-13 LAB — URINE MICROSCOPIC
Casts: 0 /LPF
Crystals, urine: 0 /LPF
Mucus: 0 /LPF

## 2008-08-13 MED ORDER — TRIMETHOPRIM-SULFAMETHOXAZOLE 160 MG-800 MG TAB
160-800 mg | ORAL_TABLET | Freq: Two times a day (BID) | ORAL | Status: AC
Start: 2008-08-13 — End: 2008-08-23

## 2008-08-13 MED ORDER — OXYCODONE-ASPIRIN 4.8355 MG-325 MG TAB
ORAL_CAPSULE | Freq: Four times a day (QID) | ORAL | Status: DC
Start: 2008-08-13 — End: 2008-10-03

## 2008-08-13 MED ORDER — ONDANSETRON 8 MG TAB, RAPID DISSOLVE
8 mg | ORAL_TABLET | Freq: Three times a day (TID) | ORAL | Status: DC | PRN
Start: 2008-08-13 — End: 2008-10-03

## 2008-08-13 MED ADMIN — nalbuphine (NUBAIN) injection 5 mg: INTRAVENOUS | @ 15:00:00 | NDC 00555112105

## 2008-08-13 MED ADMIN — ondansetron (ZOFRAN) injection 4 mg: INTRAVENOUS | @ 15:00:00 | NDC 00781301072

## 2008-08-13 MED ADMIN — acetaminophen (TYLENOL) tablet 1,000 mg: ORAL | @ 17:00:00 | NDC 51645070610

## 2008-08-13 MED FILL — ONDANSETRON (PF) 4 MG/2 ML INJECTION: 4 mg/2 mL | INTRAMUSCULAR | Qty: 2

## 2008-08-13 MED FILL — NALBUPHINE 10 MG/ML INJECTION: 10 mg/mL | INTRAMUSCULAR | Qty: 1

## 2008-08-13 MED FILL — ACETAMINOPHEN 500 MG TAB: 500 mg | ORAL | Qty: 2

## 2008-08-13 NOTE — ED Notes (Signed)
Pt verbalized understanding of discharge instructions and sedative effects of meds given and perscribed.  Discharge ambulatory with family

## 2008-08-13 NOTE — ED Notes (Signed)
WBC 12,500 with 80 polys.  Urine reveals 20-50 WBCs and 10-20 RBCs with 2+ bacteria.

## 2008-08-13 NOTE — ED Notes (Signed)
Dr Durham at bedside

## 2008-08-13 NOTE — ED Notes (Signed)
Pt has history of kidney stones

## 2008-08-13 NOTE — ED Notes (Signed)
Dr. Jeanice Lim

## 2008-08-13 NOTE — ED Provider Notes (Signed)
HPI Comments: 28 yo black female with history of kidney stones and lithotripsy presents with history of bilateral back pain , fever, and nausea that began last pm.  No emesis.  Patient denies chills or sweats.    Patient is a 28 y.o. female presenting with kidney stones. The history is provided by the patient.   Kidney Stone  This is a new problem. The current episode started yesterday. The problem occurs constantly. The problem has been gradually worsening. Associated symptoms include abdominal pain. Pertinent negatives include no headaches and no shortness of breath. Nothing relieves the symptoms. She has tried nothing for the symptoms.        Past Medical History   Diagnosis Date   ??? Other Ill-Defined Conditions      kidney stones   ??? Chronic Kidney Disease      kidney stones   ??? Postpartum Fever 05/09/2008   ??? Anemia of Pregnancy 05/09/2008   ??? Previous Cesarean Delivery, Delivered 05/09/2008          Past Surgical History   Procedure Date   ??? Hx cesarean section      x2   ??? Hx heent      tonsil   ??? Cesarean delivery only    ??? Hx other surgical      lithotripsy           No family history on file.     History   Social History   ??? Marital Status: Single     Spouse Name: N/A     Number of Children: N/A   ??? Years of Education: N/A   Occupational History   ??? Not on file.   Social History Main Topics   ??? Tobacco Use: Never   ??? Alcohol Use: No   ??? Drug Use: No   ??? Sexually Active: Yes -- Female partner(s)     Birth Control/ Protection: None   Other Topics Concern   ??? Not on file   Social History Narrative   ??? No narrative on file           ALLERGIES: Meperidine      Review of Systems   Constitutional: Positive for fever, activity change and appetite change. Negative for chills, diaphoresis and fatigue.   HENT: Negative for neck pain and neck stiffness.    Respiratory: Negative for chest tightness, shortness of breath and wheezing.    Cardiovascular: Negative for leg swelling.    Gastrointestinal: Positive for nausea and abdominal pain. Negative for vomiting, diarrhea, constipation, blood in stool, abdominal distention, anal bleeding and rectal pain.   Genitourinary: Negative for dysuria, urgency, frequency, hematuria, flank pain, decreased urine volume, difficulty urinating and pelvic pain.   Musculoskeletal: Negative.    Skin: Negative.    Neurological: Negative for dizziness, tremors, weakness, light-headedness and headaches.       Filed Vitals:    08/13/2008  9:17 AM   BP: 139/64   Pulse: 115   Temp: 101.8 ??F (38.8 ??C)   Resp: 18   Height: 6' (1.829 m)   Weight: 150 lb (68.04 kg)   SpO2: 98%              Physical Exam   Nursing note and vitals reviewed.  Constitutional: She is oriented. She appears well-developed and well-nourished. She appears distressed.   HENT:   Head: Normocephalic and atraumatic.   Eyes: Conjunctivae and extraocular motions are normal. Right eye exhibits no discharge. Left eye exhibits no discharge.  No scleral icterus.   Neck: Normal range of motion. Neck supple.   Cardiovascular: Regular rhythm, S1 normal, S2 normal and normal heart sounds.   No extrasystoles are present. Tachycardia present.  Exam reveals no gallop, no S3, no S4, no distant heart sounds and no friction rub.    No murmur heard.  Pulmonary/Chest: Effort normal and breath sounds normal. No respiratory distress. She has no wheezes. She has no rales. She exhibits no tenderness.   Abdominal: Soft. Normal appearance and bowel sounds are normal. She exhibits no distension, no abdominal bruit, ascites and no mass. There is no organomegaly, splenomegaly or hepatomegaly. No tenderness. She has CVA tenderness. She has no rigidity, no rebound, no pain at McBurney's point and no Murphy's sign.   Musculoskeletal: Normal range of motion. She exhibits no edema and no tenderness.   Neurological: She is alert and oriented. She exhibits normal muscle tone. Coordination normal.    Skin: Skin is warm and dry. No rash noted. She is not diaphoretic. No erythema. No pallor.   Psychiatric: She has a normal mood and affect.            MDM Coding   Reviewed: previous chart  Reviewed previous: CT scan  Interpretation: labs          Procedures

## 2008-10-03 LAB — URINE MICROSCOPIC
Casts: 0 /LPF
Crystals, urine: 0 /LPF
Mucus: 0 /LPF

## 2008-10-03 LAB — HCG URINE, QL. - POC: Pregnancy test,urine (POC): NEGATIVE

## 2008-10-03 MED ORDER — CIPROFLOXACIN 500 MG TAB
500 mg | ORAL_TABLET | Freq: Two times a day (BID) | ORAL | Status: AC
Start: 2008-10-03 — End: 2008-10-10

## 2008-10-03 MED ORDER — PHENAZOPYRIDINE 200 MG TAB
200 mg | ORAL_TABLET | Freq: Three times a day (TID) | ORAL | Status: AC
Start: 2008-10-03 — End: 2008-10-05

## 2008-10-03 MED ADMIN — ketorolac tromethamine (TORADOL) 60 mg/2 mL injection 60 mg: INTRAMUSCULAR | @ 15:00:00 | NDC 10019003017

## 2008-10-03 MED ADMIN — phenazopyridine (PYRIDIUM) tablet 200 mg: ORAL | @ 15:00:00 | NDC 68084029311

## 2008-10-03 MED ADMIN — ondansetron (ZOFRAN ODT) tablet 4 mg: ORAL | @ 15:00:00 | NDC 62756035664

## 2008-10-03 MED ADMIN — ciprofloxacin (CIPRO) tablet 500 mg: ORAL | @ 15:00:00 | NDC 68084007011

## 2008-10-03 MED FILL — KETOROLAC TROMETHAMINE 60 MG/2 ML IM: 60 mg/2 mL | INTRAMUSCULAR | Qty: 2

## 2008-10-03 MED FILL — ONDANSETRON 8 MG TAB, RAPID DISSOLVE: 8 mg | ORAL | Qty: 1

## 2008-10-03 MED FILL — CIPROFLOXACIN 500 MG TAB: 500 mg | ORAL | Qty: 1

## 2008-10-03 MED FILL — PHENAZOPYRIDINE 200 MG TAB: 200 mg | ORAL | Qty: 1

## 2008-10-03 NOTE — ED Notes (Signed)
Pt given dc instructions- verbalized understanding

## 2008-10-03 NOTE — ED Provider Notes (Signed)
Patient is a 28 y.o. female presenting with back pain. The history is provided by the patient. No language interpreter was used.   Back Pain   This is a new problem. The current episode started 6 to 12 hours ago. The problem has been gradually worsening. The problem occurs constantly. Patient reports no work related injury.The pain is associated with no known injury. The pain is present in the lower back. The quality of the pain is described as aching. The pain does not radiate. The pain is at a severity of 10/10. The pain is severe. Exacerbated by: nothing. Associated symptoms include dysuria. Pertinent negatives include no chest pain, no fever, no numbness, no weight loss, no headaches, no abdominal pain, no abdominal swelling, no bowel incontinence, no perianal numbness, no bladder incontinence, no pelvic pain, no leg pain, no paresthesias, no paresis, no tingling and no weakness. She has tried nothing for the symptoms. Risk factors include history of kidney stones.        Past Medical History   Diagnosis Date   ??? Other Ill-Defined Conditions      kidney stones   ??? Chronic Kidney Disease      kidney stones   ??? Postpartum Fever 05/09/2008   ??? Anemia of Pregnancy 05/09/2008   ??? Previous Cesarean Delivery, Delivered 05/09/2008          Past Surgical History   Procedure Date   ??? Hx heent      tonsil   ??? Cesarean delivery only    ??? Hx other surgical      lithotripsy   ??? Hx cesarean section      x3           No family history on file.     History   Social History   ??? Marital Status: Single     Spouse Name: N/A     Number of Children: N/A   ??? Years of Education: N/A   Occupational History   ??? Not on file.   Social History Main Topics   ??? Tobacco Use: Yes -- 0.2 packs/day   ??? Alcohol Use: No   ??? Drug Use: No   ??? Sexually Active: Yes -- Female partner(s)     Birth Control/ Protection: None   Other Topics Concern   ??? Not on file   Social History Narrative   ??? No narrative on file           ALLERGIES: Meperidine       Review of Systems   Constitutional: Negative for fever and weight loss.   Cardiovascular: Negative for chest pain.   Gastrointestinal: Negative for abdominal pain.   Genitourinary: Positive for dysuria. Negative for bladder incontinence and pelvic pain.   Musculoskeletal: Positive for back pain.   Neurological: Negative for tingling, weakness, numbness and headaches.   All other systems reviewed and are negative.        Filed Vitals:    10/03/2008  9:46 AM   BP: 124/71   Pulse: 96   Temp: 97.9 ??F (36.6 ??C)   Resp: 22   Height: 6' (1.829 m)   Weight: 161 lb (73.029 kg)   SpO2: 99%              Physical Exam   Nursing note and vitals reviewed.  Constitutional: She is oriented. She appears well-developed and well-nourished. No distress.   HENT:   Head: Normocephalic and atraumatic.   Right Ear: External ear normal.  Left Ear: External ear normal.   Nose: Nose normal.   Mouth/Throat: Oropharynx is clear and moist. No oropharyngeal exudate.   Eyes: Conjunctivae and extraocular motions are normal. Pupils are equal, round, and reactive to light. Right eye exhibits no discharge. Left eye exhibits no discharge. No scleral icterus.   Neck: Normal range of motion. Neck supple. No JVD present. No tracheal deviation present. No thyromegaly present.   Cardiovascular: Normal rate, regular rhythm and normal heart sounds.  Exam reveals no gallop and no friction rub.    No murmur heard.  Pulmonary/Chest: Effort normal and breath sounds normal. No stridor. No respiratory distress. She has no wheezes. She has no rales. She exhibits no tenderness.   Abdominal: Soft. Bowel sounds are normal. She exhibits no distension and no mass. No tenderness. She has no rebound and no guarding.   Musculoskeletal: Normal range of motion. She exhibits no edema and no tenderness.        Lumbar back: She exhibits pain.        Back:    Lymphadenopathy:     She has no cervical adenopathy.    Neurological: She is alert and oriented. No cranial nerve deficit. She exhibits normal muscle tone. Coordination normal.   Skin: Skin is warm and dry. No rash noted. She is not diaphoretic. No erythema. No pallor.   Psychiatric: She has a normal mood and affect. Her behavior is normal.            Coding      Procedures    The patient was observed in the ED.    Results Reviewed:      Recent Results (from the past 24 hour(s))   HCG, UR, QL - POC    Collection Time 10/03/08  9:50 AM   Component Value Range   ??? Pregnancy test,urine (POC) Negative   > Negative    ??? Pregnancy test,QC Valid     URINE MICROSCOPIC    Collection Time 10/03/08  9:50 AM   Component Value Range   ??? WBC 10-20  0 (/HPF)   ??? RBC 3-5  0 (/HPF)   ??? Epithelial cells 3-5  0 (/HPF)   ??? Bacteria 2+ (*) 0 (/HPF)   ??? Casts 0  0 (/LPF)   ??? Crystals 0  0 (/LPF)   ??? Mucus 0  0 (/LPF)         I discussed the results of all labs and treatments with the patient and available family.  Treatment plan is agreed upon and the patient is ready for discharge.  All voiced understanding of the discharge plan and medication instructions or changes as appropriate.  Questions about treatment in the ED were answered.  All were encouraged to return should symptoms worsen or new problems develop.

## 2008-10-03 NOTE — ED Notes (Signed)
Report to Terry Verdin, RN.

## 2008-10-03 NOTE — ED Notes (Signed)
PO & IM meds given

## 2008-11-24 LAB — HCG URINE, QL. - POC: Pregnancy test,urine (POC): NEGATIVE

## 2008-11-24 MED ORDER — ALBUTEROL 90 MCG/ACTUATION AEROSOL INHALER
90 mcg/actuation | RESPIRATORY_TRACT | Status: DC | PRN
Start: 2008-11-24 — End: 2009-03-28

## 2008-11-24 MED ORDER — CODEINE-GUAIFENESIN 10 MG-100 MG/5 ML SYRUP
10-100 mg/5 mL | Freq: Three times a day (TID) | ORAL | Status: DC | PRN
Start: 2008-11-24 — End: 2009-02-22

## 2008-11-24 MED ADMIN — ketorolac tromethamine (TORADOL) 60 mg/2 mL injection 60 mg: INTRAMUSCULAR | @ 18:00:00 | NDC 10019003017

## 2008-11-24 MED ADMIN — chlorpheniramine-hydrocodone (TUSSIONEX) oral suspension 5 mL: ORAL | @ 19:00:00 | NDC 53014054867

## 2008-11-24 MED FILL — KETOROLAC TROMETHAMINE 60 MG/2 ML IM: 60 mg/2 mL | INTRAMUSCULAR | Qty: 2

## 2008-11-24 MED FILL — HYDROCODONE 10 MG-CHLORPHENIRAMINE 8 MG/5 ML ORAL SUSP EXTEND.REL 12HR: 10-8 mg/5 mL | ORAL | Qty: 5

## 2008-11-24 NOTE — ED Notes (Signed)
Hcg negative.

## 2008-11-24 NOTE — ED Notes (Signed)
CXR - NAD

## 2008-11-24 NOTE — ED Provider Notes (Signed)
Patient is a 28 y.o. female presenting with cough. The history is provided by the patient.   Cough  The current episode started more than 1 week ago. The problem occurs constantly. The problem has not changed since onset. The cough is productive of sputum. Patient reports a subjective fever - was not measured.Associated symptoms include chest pain (when coughing), chills, myalgias and shortness of breath. Pertinent negatives include no sweats, no weight loss, no eye redness, no ear congestion, no ear pain, no headaches, no rhinorrhea, no sore throat, no wheezing, no nausea, no vomiting and no confusion. She is a smoker. Her past medical history is significant for pneumonia. Her past medical history does not include bronchitis, bronchiectasis, COPD, emphysema, asthma, cancer, heart failure or CHF.        Past Medical History   Diagnosis Date   ??? Other ill-defined conditions      kidney stones   ??? Chronic kidney disease      kidney stones   ??? Postpartum fever 05/09/2008   ??? Anemia of pregnancy 05/09/2008   ??? Previous cesarean delivery, delivered 05/09/2008          Past Surgical History   Procedure Date   ??? Hx heent      tonsil   ??? Cesarean delivery only    ??? Hx other surgical      lithotripsy   ??? Hx cesarean section      x3           No family history on file.     History   Social History   ??? Marital Status: Single     Spouse Name: N/A     Number of Children: N/A   ??? Years of Education: N/A   Occupational History   ??? Not on file.   Social History Main Topics   ??? Tobacco Use: Yes -- 0.2 packs/day   ??? Alcohol Use: No   ??? Drug Use: No   ??? Sexually Active: Yes -- Female partner(s)     Birth Control/ Protection: None   Other Topics Concern   ??? Not on file   Social History Narrative   ??? No narrative on file           ALLERGIES: Meperidine      Review of Systems   Constitutional: Positive for chills. Negative for weight loss and diaphoresis.    HENT: Positive for neck pain (lateral muscles - when coughing). Negative for ear pain, congestion, sore throat, rhinorrhea, neck stiffness and sinus pressure.    Eyes: Negative.  Negative for redness.   Respiratory: Positive for cough, chest tightness and shortness of breath. Negative for choking, wheezing and stridor.    Cardiovascular: Positive for chest pain (when coughing). Negative for palpitations and leg swelling.   Gastrointestinal: Negative.  Negative for nausea and vomiting.   Genitourinary: Negative.    Musculoskeletal: Positive for myalgias. Negative for back pain and arthralgias.   Neurological: Negative.  Negative for headaches.   Psychiatric/Behavioral: Negative.  Negative for confusion.   All other systems reviewed and are negative.        Filed Vitals:    11/24/2008  1:16 PM   BP: 98/56   Pulse: 98   Temp: 98.5 ??F (36.9 ??C)   Resp: 20   Height: 6' (1.829 m)   Weight: 161 lb (73.029 kg)   SpO2: 99%              Physical Exam   Nursing  note and vitals reviewed.  Constitutional: She appears well-developed and well-nourished.   HENT:   Head: Normocephalic and atraumatic.   Eyes: Extraocular motions are normal. Pupils are equal, round, and reactive to light.   Neck: Normal range of motion. Neck supple.   Cardiovascular: Normal rate, regular rhythm and normal heart sounds.    Pulmonary/Chest: Effort normal and breath sounds normal. No respiratory distress. She has no wheezes. She has no rales. She exhibits tenderness (tender to palpation at costal margin).   Abdominal: Soft. Bowel sounds are normal.   Musculoskeletal: Normal range of motion.   Neurological: She is alert.   Skin: Skin is warm and dry.   Psychiatric: She has a normal mood and affect. Her behavior is normal. Judgment and thought content normal.            Coding      Procedures

## 2008-11-26 LAB — URINE MICROSCOPIC
Casts: 0 /LPF
Crystals, urine: 0 /LPF
Mucus: 0 /LPF

## 2008-11-26 MED ORDER — CIPROFLOXACIN 500 MG TAB
500 mg | ORAL_TABLET | Freq: Two times a day (BID) | ORAL | Status: AC
Start: 2008-11-26 — End: 2008-12-06

## 2008-11-26 MED ADMIN — oxycodone-acetaminophen (PERCOCET) 5-325 mg per tablet 1 Tab: ORAL | @ 14:00:00 | NDC 00406051262

## 2008-11-26 MED ADMIN — levofloxacin (LEVAQUIN) tablet 750 mg: ORAL | @ 14:00:00 | NDC 50458092510

## 2008-11-26 MED FILL — LEVAQUIN 500 MG TABLET: 500 mg | ORAL | Qty: 1

## 2008-11-26 MED FILL — OXYCODONE-ACETAMINOPHEN 5 MG-325 MG TAB: 5-325 mg | ORAL | Qty: 1

## 2008-11-26 NOTE — ED Notes (Signed)
Care taken, bedside report received from Vickie Slemp, RN

## 2008-11-26 NOTE — ED Notes (Signed)
Report to E Klimbal RN

## 2008-11-26 NOTE — ED Notes (Signed)
I have reviewed discharge instructions with the patient.  The patient verbalized understanding.

## 2008-11-26 NOTE — ED Notes (Signed)
Pt was seen on 11/24/2008 for same.

## 2008-11-26 NOTE — ED Provider Notes (Signed)
HPI Comments: Here with improved cough but some ongoing general malaise. Still at times has sputum that is thick. Has some possible urinary sx. No vomiting or diarrhea. Basically is here for continued problem she was seen here a few days ago. No other changes. Sore to touch across her shoulders and her anterior chest.    Patient is a 28 y.o. female presenting with fatigue. The history is provided by the patient.   Fatigue  Chronicity: ongoing. The problem occurs constantly. The problem has not changed since onset. Pertinent negatives include no chest pain. Nothing aggravates the symptoms. Nothing relieves the symptoms.        Past Medical History   Diagnosis Date   ??? Other ill-defined conditions      kidney stones   ??? Chronic kidney disease      kidney stones   ??? Postpartum fever 05/09/2008   ??? Anemia of pregnancy 05/09/2008   ??? Previous cesarean delivery, delivered 05/09/2008          Past Surgical History   Procedure Date   ??? Hx heent      tonsil   ??? Cesarean delivery only    ??? Hx other surgical      lithotripsy   ??? Hx cesarean section      x3           No family history on file.     History   Social History   ??? Marital Status: Single     Spouse Name: N/A     Number of Children: N/A   ??? Years of Education: N/A   Occupational History   ??? Not on file.   Social History Main Topics   ??? Tobacco Use: Yes -- 0.2 packs/day   ??? Alcohol Use: No   ??? Drug Use: No   ??? Sexually Active: Yes -- Female partner(s)     Birth Control/ Protection: None   Other Topics Concern   ??? Not on file   Social History Narrative   ??? No narrative on file           ALLERGIES: Meperidine      Review of Systems   Constitutional: Positive for fatigue. Negative for fever and chills.   HENT: Positive for ear pain and congestion. Negative for hearing loss, nosebleeds, neck pain, neck stiffness and ear discharge.    Respiratory:        Chest sore to touch   Cardiovascular: Negative.  Negative for chest pain.   Gastrointestinal: Negative.     Genitourinary: Positive for dysuria. Negative for flank pain.   Musculoskeletal: Positive for myalgias.   Neurological: Negative.    Psychiatric/Behavioral: Negative.    All other systems reviewed and are negative.        Filed Vitals:    11/26/2008  7:12 AM   BP: 101/61   Pulse: 90   Temp: 99 ??F (37.2 ??C)   Resp: 18   Height: 6' (1.829 m)   Weight: 160 lb (72.576 kg)   SpO2: 97%              Physical Exam   Constitutional: She is oriented. She appears well-developed and well-nourished.   HENT:   Head: Atraumatic.   Eyes: Conjunctivae are normal. No scleral icterus.   Neck: Neck supple.   Cardiovascular: Normal rate and normal heart sounds.    Pulmonary/Chest: Effort normal. No respiratory distress. She has no wheezes.     She exhibits tenderness.  Abdominal: Soft. She exhibits no distension. No tenderness. She has no rebound and no CVA tenderness.   Musculoskeletal: She exhibits no edema.   Neurological: She is alert and oriented.   Skin: Skin is warm and dry. No erythema.   Psychiatric: She has a normal mood and affect. Her behavior is normal. Thought content normal.            MDM Coding   Reviewed: previous chart, nursing note and vitals  Interpretation: labs          Procedures

## 2009-02-22 LAB — URINE MICROSCOPIC
Casts: 0 /LPF
Crystals, urine: 0 /LPF
RBC: >100 /HPF

## 2009-02-22 LAB — HCG URINE, QL. - POC: Pregnancy test,urine (POC): NEGATIVE

## 2009-02-22 MED ORDER — TRAMADOL 50 MG TAB
50 mg | ORAL_TABLET | Freq: Four times a day (QID) | ORAL | Status: AC | PRN
Start: 2009-02-22 — End: 2009-03-04

## 2009-02-22 MED ORDER — TRIMETHOPRIM-SULFAMETHOXAZOLE 160 MG-800 MG TAB
160-800 mg | ORAL_TABLET | Freq: Two times a day (BID) | ORAL | Status: AC
Start: 2009-02-22 — End: 2009-03-01

## 2009-02-22 NOTE — ED Notes (Signed)
To room 7 ambulatory  C/o back pain   Urine collected

## 2009-02-22 NOTE — ED Provider Notes (Signed)
Patient is a 28 y.o. female presenting with back pain. The history is provided by the patient.   Back Pain   This is a recurrent problem. The problem occurs constantly. Patient reports work related injury.The quality of the pain is described as aching. The pain does not radiate. The pain is at a severity of 4/10. frequency. She has tried nothing for the symptoms.        Past Medical History   Diagnosis Date   ??? Postpartum fever 05/09/2008   ??? Anemia of pregnancy 05/09/2008   ??? Previous cesarean delivery, delivered 05/09/2008   ??? Chronic kidney disease      kidney stones   ??? Other ill-defined conditions      kidney stones          Past Surgical History   Procedure Date   ??? Cesarean delivery only    ??? Hx urological      lithotripsy   ??? Hx cesarean section      x3   ??? Hx other surgical      lithotripsy   ??? Hx heent      tonsil           No family history on file.     History   Social History   ??? Marital Status: Single     Spouse Name: N/A     Number of Children: N/A   ??? Years of Education: N/A   Occupational History   ??? Not on file.   Social History Main Topics   ??? Tobacco Use: Quit -- 0.2 packs/day   ??? Alcohol Use: No   ??? Drug Use: No   ??? Sexually Active: Yes -- Female partner(s)     Birth Control/ Protection: None   Other Topics Concern   ??? Not on file   Social History Narrative   ??? No narrative on file           ALLERGIES: Meperidine      Review of Systems   Musculoskeletal: Positive for back pain.   All other systems reviewed and are negative.        Filed Vitals:    02/22/2009  9:32 AM   BP: 129/74   Pulse: 81   Temp: 98.2 ??F (36.8 ??C)   Resp: 18   Height: 6' (1.829 m)   Weight: 150 lb (68.04 kg)   SpO2: 98%              Physical Exam   Nursing note and vitals reviewed.  Constitutional: She is oriented to person, place, and time. She appears well-developed and well-nourished.   Eyes: Pupils are equal, round, and reactive to light.   Neck: Normal range of motion. Neck supple.    Cardiovascular: Normal rate and regular rhythm.    Pulmonary/Chest: Effort normal and breath sounds normal.   Abdominal: Soft. Bowel sounds are normal.   Musculoskeletal:        Diffuse bl lower back     Neurological: She is alert and oriented to person, place, and time.   Skin: Skin is warm and dry.        Coding    Procedures

## 2009-02-22 NOTE — ED Notes (Signed)
Instructions to patient  2 prescriptions,  UTI sheet  Pt. Understands instructions

## 2009-03-28 LAB — URINE MICROSCOPIC
Casts: 0 /LPF
Crystals, urine: 0 /LPF
Mucus: 0 /LPF

## 2009-03-28 LAB — STREP AG SCREEN, GROUP A: Group A Strep Ag ID: POSITIVE — AB

## 2009-03-28 LAB — HCG URINE, QL. - POC: Pregnancy test,urine (POC): POSITIVE — AB

## 2009-03-28 MED ORDER — AMOXICILLIN 500 MG TABLET
500 mg | ORAL_TABLET | Freq: Three times a day (TID) | ORAL | Status: AC
Start: 2009-03-28 — End: 2009-04-07

## 2009-03-28 MED ORDER — LIDOCAINE 2 % MUCOSAL SOLN
2 % | Freq: Four times a day (QID) | Status: DC | PRN
Start: 2009-03-28 — End: 2009-04-05

## 2009-03-28 MED ADMIN — lidocaine (XYLOCAINE) 2 % viscous solution 20 mL: OROMUCOSAL | @ 15:00:00 | NDC 00054850016

## 2009-03-28 MED ADMIN — acetaminophen (TYLENOL) tablet 1,000 mg: ORAL | @ 15:00:00 | NDC 51645070610

## 2009-03-28 MED ADMIN — amoxicillin (AMOXIL) capsule 500 mg: ORAL | @ 15:00:00 | NDC 00093310919

## 2009-03-28 MED FILL — AMOXICILLIN 500 MG CAP: 500 mg | ORAL | Qty: 1

## 2009-03-28 MED FILL — LIDOCAINE VISCOUS 2 % MUCOSAL SOLUTION: 2 % | Qty: 20

## 2009-03-28 MED FILL — ACETAMINOPHEN 500 MG TAB: 500 mg | ORAL | Qty: 2

## 2009-03-28 NOTE — ED Notes (Signed)
Pt unable to urinate at this time. Given water.

## 2009-03-28 NOTE — ED Provider Notes (Signed)
Patient is a 28 y.o. female presenting with sore throat and urinary tract infection. The history is provided by the patient. No language interpreter was used.   Sore Throat   This is a new problem. The current episode started yesterday. The problem has not changed since onset. There has been no fever. Pertinent negatives include no diarrhea, no vomiting, no congestion, no drooling, no ear discharge, no ear pain, no headaches, no plugged ear sensation, no shortness of breath, no stridor, no swollen glands, no trouble swallowing, no stiff neck and no cough. She has had no exposure to strep and no exposure to mono. She has tried nothing for the symptoms.   Bladder Infection   This is a new problem. The current episode started yesterday. The problem occurs every urination. The problem has not changed since onset. The pain is at a severity of 10/10. There has been no fever. She is sexually active. There is no history of pyelonephritis. Associated symptoms include nausea, frequency, abdominal pain and back pain. Pertinent negatives include no chills, no sweats, no vomiting, no discharge, no hematuria, no hesitancy, no urgency, no flank pain and no vaginal discharge. The patient is not pregnant.She has tried nothing for the symptoms. Her past medical history is significant for kidney stones. Her past medical history does not include single kidney, urological procedure, recurrent UTIs, urinary stasis, catheterization or urinary catheter problem.        Past Medical History   Diagnosis Date   ??? Postpartum fever 05/09/2008   ??? Anemia of pregnancy 05/09/2008   ??? Previous cesarean delivery, delivered 05/09/2008   ??? Other ill-defined conditions      kidney stones   ??? Chronic kidney disease      kidney stones          Past Surgical History   Procedure Date   ??? Cesarean delivery only    ??? Hx urological      lithotripsy   ??? Hx cesarean section      x3   ??? Hx other surgical      lithotripsy   ??? Hx heent      tonsil            No family history on file.     History   Social History   ??? Marital Status: Single     Spouse Name: N/A     Number of Children: N/A   ??? Years of Education: N/A   Occupational History   ??? Not on file.   Social History Main Topics   ??? Tobacco Use: Quit -- 0.2 packs/day   ??? Alcohol Use: No   ??? Drug Use: No   ??? Sexually Active: Yes -- Female partner(s)     Birth Control/ Protection: None   Other Topics Concern   ??? Not on file   Social History Narrative   ??? No narrative on file           ALLERGIES: Meperidine      Review of Systems   Constitutional: Negative for chills.   HENT: Positive for sore throat. Negative for ear pain, congestion, drooling, trouble swallowing and ear discharge.    Respiratory: Negative for cough, shortness of breath and stridor.    Gastrointestinal: Positive for nausea and abdominal pain. Negative for vomiting and diarrhea.   Genitourinary: Positive for frequency. Negative for hesitancy, urgency, hematuria, flank pain and vaginal discharge.   Musculoskeletal: Positive for back pain.   Neurological: Negative for headaches.  All other systems reviewed and are negative.        Filed Vitals:    03/28/2009  8:37 AM 03/28/2009  8:47 AM   BP: 127/63 126/56   Pulse: 112    Temp: 98.7 ??F (37.1 ??C)    Resp: 18    Height: 6' (1.829 m)    Weight: 145 lb (65.772 kg)    SpO2: 100% 99%              Physical Exam   Nursing note and vitals reviewed.  Constitutional: She is oriented to person, place, and time. She appears well-developed and well-nourished. No distress.   HENT:   Head: Normocephalic and atraumatic.   Right Ear: External ear normal.   Left Ear: External ear normal.   Nose: Nose normal.   Mouth/Throat: Oropharynx is clear and moist. No oropharyngeal exudate.   Eyes: Conjunctivae and extraocular motions are normal. Pupils are equal, round, and reactive to light. Right eye exhibits no discharge. Left eye exhibits no discharge. No scleral icterus.    Neck: Normal range of motion. Neck supple. No JVD present. No tracheal deviation present. No thyromegaly present.   Cardiovascular: Normal rate, regular rhythm and normal heart sounds.  Exam reveals no gallop and no friction rub.    No murmur heard.  Pulmonary/Chest: Effort normal and breath sounds normal. No stridor. No respiratory distress. She has no wheezes. She has no rales. She exhibits no tenderness.   Abdominal: Soft. Bowel sounds are normal. She exhibits no distension and no mass. Tenderness is present in the suprapubic area. She has no rebound and no guarding.         Musculoskeletal: Normal range of motion. She exhibits no edema and no tenderness.        Lumbar back: She exhibits pain.        Back:      Lymphadenopathy:     She has no cervical adenopathy.   Neurological: She is alert and oriented to person, place, and time. No cranial nerve deficit. She exhibits normal muscle tone. Coordination normal.   Skin: Skin is warm and dry. No rash noted. She is not diaphoretic. No erythema. No pallor.   Psychiatric: She has a normal mood and affect. Her behavior is normal.        MDM Coding   Interpretation: labs        Procedures    The patient was observed in the ED.    Results Reviewed:      Recent Results (from the past 24 hour(s))   URINE MICROSCOPIC    Collection Time    03/28/09  8:55 AM   Component Value Range   ??? WBC 5-10  0 (/HPF)   ??? RBC 3-5  0 (/HPF)   ??? Epithelial cells 10-20  0 (/HPF)   ??? Bacteria TRACE  0 (/HPF)   ??? Casts 0  0 (/LPF)   ??? Crystals 0  0 (/LPF)   ??? Mucus 0  0 (/LPF)   HCG URINE, QL. - POC    Collection Time    03/28/09  9:00 AM   Component Value Range   ??? Pregnancy test,urine (POC) POSITIVE (*) NEGATIVE    STREP AG SCREEN, GROUP A    Collection Time    03/28/09  9:35 AM   Component Value Range   ??? Group A Strep Ag ID POSITIVE (*) NEGATIVE           I discussed the results of  all labs, procedures, and treatments with the patient.  Treatment plan is agreed upon and the patient is ready for discharge.  All voiced understanding of the discharge plan and medication instructions or changes as appropriate.  Questions about treatment in the ED were answered.  All were encouraged to return should symptoms worsen or new problems develop.

## 2009-03-28 NOTE — ED Notes (Signed)
Pt result positive for pregnancy. Result in glucometer, but isn't transmitting to connect care results.

## 2009-03-28 NOTE — ED Notes (Signed)
I have reviewed discharge instructions with the patient.  The patient verbalized understanding.

## 2009-04-01 NOTE — ED Notes (Signed)
I have retrospectively accessed this chart for auditing purposes

## 2009-04-05 LAB — BETA HCG, QT
Beta HCG, QT: 73015 m[IU]/mL — ABNORMAL HIGH (ref 0.0–6.0)
hCG Quant: 73015 m[IU]/mL — ABNORMAL HIGH (ref 0.0–6.0)

## 2009-04-05 LAB — CBC WITH AUTOMATED DIFF
ABS. BASOPHILS: 0 10*3/uL (ref 0.0–0.2)
ABS. EOSINOPHILS: 0 10*3/uL (ref 0.0–0.8)
ABS. IMM. GRANS.: 0 10*3/uL (ref 0.0–2.0)
ABS. LYMPHOCYTES: 2.9 10*3/uL (ref 0.5–4.6)
ABS. MONOCYTES: 0.6 10*3/uL (ref 0.1–1.3)
ABS. NEUTROPHILS: 4.3 10*3/uL (ref 1.7–8.2)
BASOPHILS: 0 % (ref 0.0–2.0)
EOSINOPHILS: 0 % — ABNORMAL LOW (ref 0.5–7.8)
HCT: 40.2 % (ref 37.6–48.3)
HGB: 13.3 g/dL (ref 11.7–15.0)
IMMATURE GRANULOCYTES: 0.3 % (ref 0.0–2.0)
LYMPHOCYTES: 37 % (ref 13–44)
MCH: 30.6 PG (ref 26.1–32.9)
MCHC: 33.1 g/dL (ref 31.4–35.0)
MCV: 92.6 FL (ref 79.6–97.8)
MONOCYTES: 7 % (ref 4.0–12.0)
MPV: 9.7 FL — ABNORMAL LOW (ref 10.8–14.1)
NEUTROPHILS: 56 % (ref 43–78)
PLATELET: 264 10*3/uL (ref 140–440)
RBC: 4.34 M/uL (ref 3.86–5.18)
RDW: 12.8 % (ref 11.9–14.6)
WBC: 7.8 10*3/uL (ref 4.0–10.5)

## 2009-04-05 LAB — URINE MICROSCOPIC
Casts: 0 /LPF
Crystals, urine: 0 /LPF
Mucus: 0 /LPF

## 2009-04-05 LAB — METABOLIC PANEL, COMPREHENSIVE
A-G Ratio: 1 — ABNORMAL LOW (ref 1.2–3.5)
ALT (SGPT): 30 U/L — ABNORMAL LOW (ref 39–65)
AST (SGOT): 12 U/L — ABNORMAL LOW (ref 15–37)
Albumin: 3.8 g/dL (ref 3.5–5.0)
Alk. phosphatase: 58 U/L (ref 50–136)
Anion gap: 6 mmol/L — ABNORMAL LOW (ref 7–16)
BUN: 9 MG/DL (ref 7–18)
Bilirubin, total: 0.3 MG/DL (ref 0.2–1.1)
CO2: 29 MMOL/L (ref 21–32)
Calcium: 9.1 MG/DL (ref 8.4–10.4)
Chloride: 102 MMOL/L (ref 98–107)
Creatinine: 0.9 MG/DL (ref 0.6–1.0)
GFR est AA: >60 mL/min/{1.73_m2} (ref >60)
GFR est non-AA: >60 mL/min/{1.73_m2} (ref >60)
Globulin: 3.8 g/dL — ABNORMAL HIGH (ref 2.3–3.5)
Glucose: 87 MG/DL (ref 74–106)
Potassium: 4.2 MMOL/L (ref 3.5–5.1)
Protein, total: 7.6 g/dL (ref 6.3–8.2)
Sodium: 137 MMOL/L (ref 136–145)

## 2009-04-05 LAB — HCG URINE, QL. - POC: Pregnancy test,urine (POC): POSITIVE — AB

## 2009-04-05 MED ADMIN — ondansetron (ZOFRAN) injection 4 mg: INTRAVENOUS | @ 22:00:00 | NDC 00781301072

## 2009-04-05 MED ADMIN — sodium chloride 0.9 % bolus infusion 1,000 mL: INTRAVENOUS | @ 22:00:00 | NDC 00409798309

## 2009-04-05 MED FILL — ONDANSETRON (PF) 4 MG/2 ML INJECTION: 4 mg/2 mL | INTRAMUSCULAR | Qty: 2

## 2009-04-05 NOTE — ED Notes (Signed)
I have reviewed discharge instructions with the patient.  The patient verbalized understanding.

## 2009-04-05 NOTE — ED Notes (Signed)
Pt to ultrasound via w/c.

## 2009-04-05 NOTE — ED Notes (Signed)
I had no direct involvement in the care of this patient.  This signature is to meet administrative requirements only.

## 2009-04-05 NOTE — ED Notes (Signed)
Pt states that she feels better, awaiting ultrasound.

## 2009-04-06 MED ORDER — PHENAZOPYRIDINE 200 MG TAB
200 mg | ORAL_TABLET | Freq: Three times a day (TID) | ORAL | Status: AC
Start: 2009-04-06 — End: 2009-04-07

## 2009-04-06 MED ORDER — PROMETHAZINE 25 MG TAB
25 mg | ORAL_TABLET | Freq: Four times a day (QID) | ORAL | Status: AC | PRN
Start: 2009-04-06 — End: 2009-04-12

## 2009-04-06 MED ORDER — NITROFURANTOIN (25% MACROCRYSTAL FORM) 100 MG CAP
100 mg | ORAL_CAPSULE | Freq: Two times a day (BID) | ORAL | Status: AC
Start: 2009-04-06 — End: 2009-04-08

## 2009-04-06 NOTE — ED Provider Notes (Signed)
HPI Comments: Pt states that she began to exp sudden onset of n/v/d this a.m.  Pt has never exp morning sickness in prior preg.  She denies f/c.  Although, has been experiencing reported symptoms she has been eating and drinking reg food items which incl greasy food items.  Pt w/prior hx of R ovarian cyst.  Emesis X2; diarrhea X1.      Patient is a 28 y.o. female presenting with diarrhea. The history is provided by the patient.   Diarrhea   This is a new problem. The current episode started 6 to 12 hours ago. The problem occurs 2 to 4 times per day. The problem has been gradually improving. There has been no fever. The stool consistency is described as loose. Associated symptoms include abdominal pain and vomiting. Pertinent negatives include no chills, no sweats, no headaches, no arthralgias, no myalgias, no URI, no cough, no anal bleeding and no back pain. Risk factors: preg. She has tried nothing for the symptoms. Her past medical history does not include irritable bowel syndrome, inflammatory bowel disease, short gut syndrome, bowel resection, recent abdominal surgery, malabsorption, gastric bypass, nursing home resident or small bowel obstruction.        Past Medical History   Diagnosis Date   ??? Postpartum fever 05/09/2008   ??? Anemia of pregnancy 05/09/2008   ??? Previous cesarean delivery, delivered 05/09/2008   ??? Chronic kidney disease      kidney stones   ??? Other ill-defined conditions      kidney stones          Past Surgical History   Procedure Date   ??? Cesarean delivery only    ??? Hx urological      lithotripsy   ??? Hx cesarean section      x3   ??? Hx other surgical      lithotripsy   ??? Hx heent      tonsils           No family history on file.     History   Social History   ??? Marital Status: Single     Spouse Name: N/A     Number of Children: N/A   ??? Years of Education: N/A   Occupational History   ??? Not on file.   Social History Main Topics   ??? Tobacco Use: Quit -- 0.2 packs/day   ??? Alcohol Use: No    ??? Drug Use: No   ??? Sexually Active: Yes -- Female partner(s)     Birth Control/ Protection: None   Other Topics Concern   ??? Not on file   Social History Narrative   ??? No narrative on file           ALLERGIES: Meperidine      Review of Systems   Constitutional: Positive for activity change and fatigue. Negative for fever, chills, diaphoresis and appetite change.   HENT: Negative for neck pain and neck stiffness.    Respiratory: Negative for cough, chest tightness and shortness of breath.    Cardiovascular: Negative for chest pain.   Gastrointestinal: Positive for vomiting, abdominal pain and diarrhea. Negative for nausea and anal bleeding.   Genitourinary: Negative for dysuria, urgency, frequency, hematuria, flank pain, decreased urine volume, vaginal bleeding, vaginal discharge, difficulty urinating, vaginal pain, menstrual problem and pelvic pain.   Musculoskeletal: Negative for myalgias, back pain, joint swelling and arthralgias.   Skin: Negative for rash, color change and wound.   Neurological: Negative for  headaches.   All other systems reviewed and are negative.        Filed Vitals:    04/05/2009  2:49 PM 04/05/2009  7:00 PM 04/05/2009  8:54 PM   BP: 101/71 98/57 101/53   Pulse: 86 88 78   Temp: 99 ??F (37.2 ??C)     Resp: 18 16 18    Height: 6' (1.829 m)     Weight: 146 lb (66.225 kg)     SpO2: 98%                Physical Exam   Nursing note and vitals reviewed.  Constitutional: She is oriented to person, place, and time. She appears well-developed and well-nourished. No distress.   HENT:   Head: Normocephalic and atraumatic.   Eyes: Extraocular motions are normal. Pupils are equal, round, and reactive to light.   Neck: Normal range of motion. Neck supple.   Cardiovascular: Normal rate, regular rhythm, normal heart sounds and intact distal pulses.  Exam reveals no gallop and no friction rub.    No murmur heard.   Pulmonary/Chest: Effort normal and breath sounds normal. No respiratory distress. She has no wheezes. She has no rales.   Abdominal: Soft. Bowel sounds are normal. She exhibits no distension. Tenderness (Mild pain in LLQ > LUQ) is present. She has no rebound and no guarding.   Musculoskeletal: Normal range of motion. She exhibits no edema and no tenderness.   Neurological: She is alert and oriented to person, place, and time. She has normal reflexes. No cranial nerve deficit. She exhibits normal muscle tone. Coordination normal.   Skin: Skin is warm and dry. No rash noted. She is not diaphoretic. No erythema.        MDM Coding   Reviewed: nursing note and vitals  Interpretation: labs and ultrasound    OB US = 1. Single live intrauterine pregnancy with an estimated gestational age of [redacted]   weeks and 2 days.  2. The yolk sac appears to contain a thickened wall. This is of uncertain   etiology. This could be followed up on subsequent imaging.   3. Mildly complex left ovarian cyst measuring 1.8 cm.  4. Trace amount of free fluid in the left adnexa. This is likely within   physiologic limits. ??    Labs unremarkable; UA - UTI    Procedures    The patient was observed in the ED.    Results Reviewed:      No results found for this or any previous visit (from the past 24 hour(s)).    I discussed the results of all labs, procedures, radiographs, and treatments with the patient.  Treatment plan is agreed upon and the patient is ready for discharge.  All voiced understanding of the discharge plan and medication instructions or changes as appropriate.  Questions about treatment in the ED were answered.  All were encouraged to return should symptoms worsen or new problems develop.

## 2009-04-13 LAB — CBC WITH AUTOMATED DIFF
ABS. BASOPHILS: 0 10*3/uL (ref 0.0–0.2)
ABS. EOSINOPHILS: 0 10*3/uL (ref 0.0–0.8)
ABS. IMM. GRANS.: 0 10*3/uL (ref 0.0–2.0)
ABS. LYMPHOCYTES: 2.7 10*3/uL (ref 0.5–4.6)
ABS. MONOCYTES: 0.8 10*3/uL (ref 0.1–1.3)
ABS. NEUTROPHILS: 5.8 10*3/uL (ref 1.7–8.2)
BASOPHILS: 0 % (ref 0.0–2.0)
EOSINOPHILS: 0 % — ABNORMAL LOW (ref 0.5–7.8)
HCT: 37.1 % — ABNORMAL LOW (ref 37.6–48.3)
HGB: 12.6 g/dL (ref 11.7–15.0)
IMMATURE GRANULOCYTES: 0.1 % (ref 0.0–2.0)
LYMPHOCYTES: 29 % (ref 13–44)
MCH: 30.9 PG (ref 26.1–32.9)
MCHC: 34 g/dL (ref 31.4–35.0)
MCV: 90.9 FL (ref 79.6–97.8)
MONOCYTES: 8 % (ref 4.0–12.0)
MPV: 9.6 FL — ABNORMAL LOW (ref 10.8–14.1)
NEUTROPHILS: 63 % (ref 43–78)
PLATELET: 236 10*3/uL (ref 140–440)
RBC: 4.08 M/uL (ref 3.86–5.18)
RDW: 12.5 % (ref 11.9–14.6)
WBC: 9.3 10*3/uL (ref 4.0–10.5)

## 2009-04-13 LAB — STREP AG SCREEN, GROUP A: Group A Strep Ag ID: NEGATIVE

## 2009-04-13 MED ADMIN — sodium chloride 0.9 % bolus infusion 1,000 mL: INTRAVENOUS | NDC 00409798309

## 2009-04-13 MED ADMIN — ondansetron (ZOFRAN) injection 4 mg: INTRAVENOUS | NDC 00781301072

## 2009-04-13 MED FILL — ONDANSETRON (PF) 4 MG/2 ML INJECTION: 4 mg/2 mL | INTRAMUSCULAR | Qty: 2

## 2009-04-13 NOTE — ED Notes (Signed)
To room 10 for pelvic exam.

## 2009-04-13 NOTE — ED Notes (Signed)
I have reviewed discharge instructions with the patient.  The patient verbalized understanding.

## 2009-04-13 NOTE — ED Notes (Signed)
Patient given graham crackers and ginger ale.

## 2009-04-13 NOTE — ED Notes (Signed)
Patient without complaints of nausea.

## 2009-04-13 NOTE — ED Notes (Signed)
HPI and results d/w Dr who agrees to dispatch available hospitalist to evaluate patient for admission.

## 2009-04-13 NOTE — ED Provider Notes (Signed)
Patient is a 28 y.o. female presenting with vomiting. The history is provided by the patient.   Vomiting   This is a new problem. Episode onset: 2-3 weeks. Episode frequency: 1-2 times in A.M usually between 6-9. The problem has not changed since onset. There has been no fever. Pertinent negatives include no chills, no fever, no sweats, no abdominal pain, no diarrhea, no headaches, no arthralgias, no myalgias, no cough and no URI. Yes (8 weeks), the patient is pregnant.Risk factors include recent antibiotics (seen here 1 wek ago dx with UTI and given macrobid and phnenergan).        Past Medical History   Diagnosis Date   ??? Postpartum fever 05/09/2008   ??? Anemia of pregnancy 05/09/2008   ??? Previous cesarean delivery, delivered 05/09/2008   ??? Chronic kidney disease      kidney stones   ??? Other ill-defined conditions      kidney stones          Past Surgical History   Procedure Date   ??? Cesarean delivery only    ??? Hx urological      lithotripsy   ??? Hx cesarean section      x3   ??? Hx other surgical      lithotripsy   ??? Hx heent      tonsils           No family history on file.     History   Social History   ??? Marital Status: Single     Spouse Name: N/A     Number of Children: N/A   ??? Years of Education: N/A   Occupational History   ??? Not on file.   Social History Main Topics   ??? Tobacco Use: Quit -- 0.2 packs/day   ??? Alcohol Use: No   ??? Drug Use: No   ??? Sexually Active: Yes -- Female partner(s)     Birth Control/ Protection: None   Other Topics Concern   ??? Not on file   Social History Narrative   ??? No narrative on file           ALLERGIES: Meperidine      Review of Systems   Constitutional: Negative.  Negative for fever and chills.   HENT: Negative.  Negative for ear pain, congestion, sore throat, rhinorrhea, sneezing, drooling, mouth sores, trouble swallowing, neck pain, neck stiffness, voice change, postnasal drip and sinus pressure.    Eyes: Negative.  Negative for pain and redness.    Respiratory: Negative.  Negative for cough, chest tightness and shortness of breath.    Cardiovascular: Negative.  Negative for chest pain, palpitations and leg swelling.   Gastrointestinal: Positive for nausea and vomiting. Negative for abdominal pain, diarrhea and constipation.   Genitourinary: Positive for vaginal discharge (clear). Negative for dysuria, urgency, frequency, hematuria, flank pain, decreased urine volume, vaginal bleeding, enuresis, difficulty urinating, genital sore, vaginal pain, menstrual problem, pelvic pain and dyspareunia.   Musculoskeletal: Negative.  Negative for myalgias and arthralgias.   Skin: Negative.  Negative for rash.   Neurological: Negative.  Negative for dizziness, light-headedness and headaches.   Psychiatric/Behavioral: Negative.    All other systems reviewed and are negative.        Filed Vitals:    04/13/2009  6:55 PM 04/13/2009  9:05 PM   BP: 116/61    Pulse: 84    Temp: 98.4 ??F (36.9 ??C) 97.4 ??F (36.3 ??C)   Resp: 20    Height:  6' (1.829 m)    Weight: 153 lb (69.4 kg)    SpO2: 99%               Physical Exam   Nursing note and vitals reviewed.  Constitutional: She is oriented to person, place, and time. She appears well-developed and well-nourished. No distress.   HENT:   Head: Normocephalic and atraumatic.   Eyes: Conjunctivae are normal.   Neck: Normal range of motion. Neck supple.   Cardiovascular: Normal rate, regular rhythm, normal heart sounds and intact distal pulses.    Pulmonary/Chest: Effort normal and breath sounds normal. No respiratory distress.   Abdominal: Soft. Bowel sounds are normal. No tenderness.   Genitourinary: Pelvic exam was performed with patient supine. Cervix exhibits discharge (clear-yellow). Discharge found.   Musculoskeletal: Normal range of motion.   Neurological: She is alert and oriented to person, place, and time.   Skin: Skin is warm and dry. She is not diaphoretic.   Psychiatric: She has a normal mood and affect. Her behavior is normal.         MDM Coding   Reviewed: nursing note and vitals  Interpretation: labs        Procedures    +trich in UA

## 2009-04-13 NOTE — ED Notes (Signed)
Patient sitting in room with visitor laughing and talking. States she has been very sick and has had a fever.

## 2009-04-14 LAB — WET PREP

## 2009-04-14 LAB — METABOLIC PANEL, BASIC
Anion gap: 11 mmol/L (ref 7–16)
BUN: 9 MG/DL (ref 7–18)
CO2: 24 MMOL/L (ref 21–32)
Calcium: 9 MG/DL (ref 8.4–10.4)
Chloride: 101 MMOL/L (ref 98–107)
Creatinine: 0.7 MG/DL (ref 0.6–1.0)
GFR est AA: >60 mL/min/{1.73_m2} (ref >60)
GFR est non-AA: >60 mL/min/{1.73_m2} (ref >60)
Glucose: 92 MG/DL (ref 74–106)
Potassium: 3.6 MMOL/L (ref 3.5–5.1)
Sodium: 136 MMOL/L (ref 136–145)

## 2009-04-14 LAB — URINE MICROSCOPIC
Casts: 0 /LPF
Crystals, urine: 0 /LPF
Mucus: 0 /LPF

## 2009-04-14 LAB — INFLUENZA A & B AG (RAPID TEST)
Influenza A Ag: NEGATIVE
Influenza B Ag: NEGATIVE

## 2009-04-14 MED ADMIN — azithromycin (ZITHROMAX) powder 1 g: ORAL | @ 03:00:00 | NDC 59762305101

## 2009-04-14 MED ADMIN — cefpodoxime (VANTIN) tablet 400 mg: ORAL | @ 03:00:00 | NDC 00781543920

## 2009-04-14 MED FILL — AZITHROMYCIN 1 GRAM ORAL PACKET: 1 gram | ORAL | Qty: 1

## 2009-04-14 MED FILL — CEFPODOXIME 200 MG TAB: 200 mg | ORAL | Qty: 2

## 2009-04-15 LAB — CULTURE, URINE
Culture result:: >100000
Culture: >100000

## 2009-04-15 LAB — CHLAMYDIA/GC DNA PROBE
Chlamydia: NEGATIVE
N. gonorrhoeae: NEGATIVE

## 2009-04-16 LAB — CULTURE, STREP THROAT

## 2009-05-02 LAB — URINE MICROSCOPIC
Casts: 0 /LPF
Crystals, urine: 0 /LPF

## 2009-05-02 LAB — WET PREP

## 2009-05-02 LAB — PTH INTACT
Calcium: 8.6 MG/DL (ref 8.4–10.4)
PTH, Intact: 14.6 pg/mL (ref 14.0–72.0)

## 2009-05-02 LAB — HCG URINE, QL. - POC: Pregnancy test,urine (POC): POSITIVE — AB

## 2009-05-02 MED ORDER — NITROFURANTOIN (25% MACROCRYSTAL FORM) 100 MG CAP
100 mg | ORAL_CAPSULE | Freq: Two times a day (BID) | ORAL | Status: AC
Start: 2009-05-02 — End: 2009-05-12

## 2009-05-02 MED ORDER — CLOTRIMAZOLE 1 % VAGINAL CREAM
1 % | Freq: Every evening | VAGINAL | Status: AC
Start: 2009-05-02 — End: 2009-05-09

## 2009-05-02 NOTE — ED Notes (Signed)
I have reviewed discharge instructions with the patient.  The patient verbalized understanding.

## 2009-05-02 NOTE — ED Provider Notes (Signed)
HPI Comments: 28 bf complains of seeing some blood after wiping the vaginal area. She has been having increased urination and some burning. She has bacterial vaginitis recently and tirchimoniasis as well. She denies vaginal cramping or heavy bleeding. She is a G4 P3 A0 with 11 week IUP.     Patient is a 28 y.o. female presenting with Pregnant Now.   Pregnancy Problem  Primary symptoms include discharge, dysuria and vaginal bleeding.  Primary symptoms include no pelvic pain, no dyspareunia, no genital lesions, no genital pain, no genital rash, no genital itching and no genital odor. slight amount of blood. There has been no fever. This is a new problem. The current episode started 12 to 24 hours ago. The problem occurs constantly. The problem has not changed since onset. She is pregnant. She has missed her period. Vaginal discharge characteristics: blood tinged. Associated symptoms include frequency. Pertinent negatives include no anorexia, no diaphoresis, no abdominal swelling, no abdominal pain, no constipation, no diarrhea, no nausea, no vomiting, no light-headedness, no dizziness, no flank pain and no fever. She has tried acetaminophen for the symptoms. The treatment provided mild relief. Associated medical issues include STD and vaginosis. Associated medical issues do not include PID.        Past Medical History   Diagnosis Date   ??? Postpartum fever 05/09/2008   ??? Anemia of pregnancy 05/09/2008   ??? Previous cesarean delivery, delivered 05/09/2008   ??? Chronic kidney disease      kidney stones   ??? Other ill-defined conditions      kidney stones          Past Surgical History   Procedure Date   ??? Cesarean delivery only    ??? Hx urological      lithotripsy   ??? Hx cesarean section      x3   ??? Hx other surgical      lithotripsy   ??? Hx heent      tonsils           No family history on file.     History   Social History   ??? Marital Status: Single     Spouse Name: N/A     Number of Children: N/A    ??? Years of Education: N/A   Occupational History   ??? Not on file.   Social History Main Topics   ??? Tobacco Use: Quit -- 0.2 packs/day   ??? Alcohol Use: No   ??? Drug Use: No   ??? Sexually Active: Yes -- Female partner(s)     Birth Control/ Protection: None   Other Topics Concern   ??? Not on file   Social History Narrative   ??? No narrative on file           ALLERGIES: Meperidine      Review of Systems   Constitutional: Negative for fever and diaphoresis.   Gastrointestinal: Negative for nausea, vomiting, abdominal pain, diarrhea and constipation.   Genitourinary: Positive for dysuria, frequency and vaginal bleeding. Negative for flank pain, pelvic pain and dyspareunia.   Neurological: Negative for dizziness and light-headedness.   All other systems reviewed and are negative.        Filed Vitals:    05/02/2009  7:37 AM   BP: 115/66   Pulse: 90   Temp: 98.2 ??F (36.8 ??C)   Resp: 20   Height: 6' (1.829 m)   Weight: 153 lb (69.4 kg)   SpO2: 100%  Physical Exam   Nursing note and vitals reviewed.  Constitutional: She is oriented to person, place, and time. She appears well-developed and well-nourished.   HENT:   Head: Normocephalic and atraumatic.   Right Ear: External ear normal.   Left Ear: External ear normal.   Nose: Nose normal.   Mouth/Throat: Oropharynx is clear and moist.   Eyes: Conjunctivae and extraocular motions are normal. Pupils are equal, round, and reactive to light.   Neck: Normal range of motion. Neck supple.   Cardiovascular: Normal rate, regular rhythm, normal heart sounds and intact distal pulses.    Pulmonary/Chest: Effort normal and breath sounds normal.   Abdominal: Soft.   Genitourinary: Discharge found.        White dc, ua + wbc and small blood. No flank pain or tenderness.    Musculoskeletal: Normal range of motion. She exhibits no edema and no tenderness.   Neurological: She is alert and oriented to person, place, and time.   Skin: Skin is warm and dry. No erythema.        Coding     Procedures

## 2009-05-02 NOTE — ED Notes (Signed)
OB-Dr Ebony Hail. Pt c/o lower back pain and light bleeding after using the bathroom. Has a history of surgically removed kidney stones. Onset 0130 this morning.

## 2009-05-05 LAB — CHLAMYDIA/GC DNA PROBE
Chlamydia: NEGATIVE
N. gonorrhoeae: NEGATIVE

## 2009-06-01 LAB — CBC WITH AUTOMATED DIFF
ABS. BASOPHILS: 0 10*3/uL (ref 0.0–0.2)
ABS. EOSINOPHILS: 0 10*3/uL (ref 0.0–0.8)
ABS. IMM. GRANS.: 0 10*3/uL (ref 0.0–2.0)
ABS. LYMPHOCYTES: 1.1 10*3/uL (ref 0.5–4.6)
ABS. MONOCYTES: 0.2 10*3/uL (ref 0.1–1.3)
ABS. NEUTROPHILS: 4.5 10*3/uL (ref 1.7–8.2)
BASOPHILS: 0 % (ref 0.0–2.0)
EOSINOPHILS: 1 % (ref 0.5–7.8)
HCT: 33.4 % — ABNORMAL LOW (ref 37.6–48.3)
HGB: 11 g/dL — ABNORMAL LOW (ref 11.7–15.0)
IMMATURE GRANULOCYTES: 0.2 % (ref 0.0–2.0)
LYMPHOCYTES: 19 % (ref 13–44)
MCH: 30.5 PG (ref 26.1–32.9)
MCHC: 32.9 g/dL (ref 31.4–35.0)
MCV: 92.5 FL (ref 79.6–97.8)
MONOCYTES: 4 % (ref 4.0–12.0)
MPV: 9.6 FL — ABNORMAL LOW (ref 10.8–14.1)
NEUTROPHILS: 76 % (ref 43–78)
PLATELET: 195 10*3/uL (ref 140–440)
RBC: 3.61 M/uL — ABNORMAL LOW (ref 3.86–5.18)
RDW: 13.1 % (ref 11.9–14.6)
WBC: 5.9 10*3/uL (ref 4.0–10.5)

## 2009-06-01 LAB — URINE MICROSCOPIC
Casts: 0 /LPF
Crystals, urine: 0 /LPF
Mucus: 0 /LPF

## 2009-06-01 LAB — METABOLIC PANEL, COMPREHENSIVE
A-G Ratio: 0.9 — ABNORMAL LOW (ref 1.2–3.5)
ALT (SGPT): 24 U/L — ABNORMAL LOW (ref 39–65)
AST (SGOT): 9 U/L — ABNORMAL LOW (ref 15–37)
Albumin: 3 g/dL — ABNORMAL LOW (ref 3.5–5.0)
Alk. phosphatase: 53 U/L (ref 50–136)
Anion gap: 10 mmol/L (ref 7–16)
BUN: 9 MG/DL (ref 7–18)
Bilirubin, total: 0.3 MG/DL (ref 0.2–1.1)
CO2: 25 MMOL/L (ref 21–32)
Calcium: 8.5 MG/DL (ref 8.4–10.4)
Chloride: 103 MMOL/L (ref 98–107)
Creatinine: 0.7 MG/DL (ref 0.6–1.0)
GFR est AA: >60 mL/min/{1.73_m2} (ref >60)
GFR est non-AA: >60 mL/min/{1.73_m2} (ref >60)
Globulin: 3.5 g/dL (ref 2.3–3.5)
Glucose: 93 MG/DL (ref 74–106)
Potassium: 3.8 MMOL/L (ref 3.5–5.1)
Protein, total: 6.5 g/dL (ref 6.3–8.2)
Sodium: 138 MMOL/L (ref 136–145)

## 2009-06-01 LAB — HCG URINE, QL. - POC: Pregnancy test,urine (POC): POSITIVE — AB

## 2009-06-01 LAB — LIPASE: Lipase: 204 U/L (ref 73–393)

## 2009-06-01 MED ORDER — METOCLOPRAMIDE 10 MG TAB
10 mg | ORAL_TABLET | Freq: Four times a day (QID) | ORAL | Status: AC | PRN
Start: 2009-06-01 — End: 2009-06-11

## 2009-06-01 MED ORDER — PHENAZOPYRIDINE 200 MG TAB
200 mg | ORAL_TABLET | Freq: Three times a day (TID) | ORAL | Status: AC
Start: 2009-06-01 — End: 2009-06-03

## 2009-06-01 MED ORDER — NITROFURANTOIN (25% MACROCRYSTAL FORM) 100 MG CAP
100 mg | ORAL_CAPSULE | Freq: Two times a day (BID) | ORAL | Status: AC
Start: 2009-06-01 — End: 2009-06-15

## 2009-06-01 MED ADMIN — ondansetron (ZOFRAN) injection 4 mg: INTRAVENOUS | @ 11:00:00 | NDC 00781301072

## 2009-06-01 MED ADMIN — cefTRIAXone (ROCEPHIN) 1 g in 0.9% sodium chloride (MBP/ADV) 50 mL MBP: INTRAVENOUS | @ 12:00:00 | NDC 00781320885

## 2009-06-01 MED ADMIN — sodium chloride 0.9 % bolus infusion 1,000 mL: INTRAVENOUS | @ 11:00:00 | NDC 00409798309

## 2009-06-01 MED FILL — SODIUM CHLORIDE 0.9 % IV PIGGY BACK: INTRAVENOUS | Qty: 50

## 2009-06-01 MED FILL — ONDANSETRON (PF) 4 MG/2 ML INJECTION: 4 mg/2 mL | INTRAMUSCULAR | Qty: 2

## 2009-06-01 NOTE — ED Notes (Signed)
[redacted] weeks pregnant. Due May 10

## 2009-06-01 NOTE — ED Notes (Signed)
I have reviewed discharge instructions with the patient.  The patient verbalized understanding.

## 2009-06-01 NOTE — ED Provider Notes (Signed)
Patient is a 28 y.o. female presenting with vomiting. The history is provided by the patient. No language interpreter was used.   Vomiting   This is a new problem. The current episode started 6 to 12 hours ago. The problem occurs 2 to 4 times per day. The problem has not changed since onset. The emesis has an appearance of stomach contents. There has been no fever. Associated symptoms include abdominal pain. Pertinent negatives include no chills, no fever, no diarrhea, no cough and no URI. Yes, the patient is pregnant.       Past Medical History   Diagnosis Date   ??? Postpartum fever 05/09/2008   ??? Anemia of pregnancy 05/09/2008   ??? Previous cesarean delivery, delivered 05/09/2008   ??? Chronic kidney disease      kidney stones   ??? Other ill-defined conditions      kidney stones          Past Surgical History   Procedure Date   ??? Cesarean delivery only    ??? Hx urological      lithotripsy   ??? Hx cesarean section      x3   ??? Hx other surgical      lithotripsy   ??? Hx heent      tonsils           No family history on file.     History   Social History   ??? Marital Status: Single     Spouse Name: N/A     Number of Children: N/A   ??? Years of Education: N/A   Occupational History   ??? Not on file.   Social History Main Topics   ??? Tobacco Use: Quit -- 0.2 packs/day   ??? Alcohol Use: No   ??? Drug Use: No   ??? Sexually Active: Yes -- Female partner(s)     Birth Control/ Protection: None   Other Topics Concern   ??? Not on file   Social History Narrative   ??? No narrative on file           ALLERGIES: Meperidine      Review of Systems   Constitutional: Negative.  Negative for fever and chills.   HENT: Negative.    Eyes: Negative.    Respiratory: Negative.  Negative for cough.    Cardiovascular: Negative.    Gastrointestinal: Positive for nausea, vomiting and abdominal pain. Negative for diarrhea, constipation, blood in stool and abdominal distention.   Genitourinary: Negative.    Musculoskeletal: Negative.    Skin: Negative.     Neurological: Negative.    All other systems reviewed and are negative.        Filed Vitals:    06/01/2009  5:54 AM   BP: 120/52   Pulse: 88   Temp: 97.7 ??F (36.5 ??C)   Resp: 20   Height: 6' (1.829 m)   Weight: 170 lb (77.111 kg)              Physical Exam   Nursing note and vitals reviewed.  Constitutional: She is oriented to person, place, and time. She appears well-developed and well-nourished. No distress.   HENT:   Head: Normocephalic and atraumatic.   Mouth/Throat: Oropharynx is clear and moist.   Neck: Normal range of motion. No tracheal deviation present.   Cardiovascular: Normal rate, regular rhythm and intact distal pulses.    Pulmonary/Chest: Effort normal and breath sounds normal. No stridor. No respiratory distress.   Abdominal: Soft.  She exhibits no distension and no mass. Bowel sounds are increased. There is no organomegaly. Tenderness is present in the epigastric area. She has no rebound and no guarding. No hernia.        Abdomen is appropriately gravid for G.A.   Neurological: She is alert and oriented to person, place, and time. No cranial nerve deficit. Coordination normal.   Skin: Skin is warm and dry. No rash noted. She is not diaphoretic. No erythema.        Coding    Procedures

## 2009-06-07 NOTE — ED Notes (Signed)
Pt hx of kidney stones, passed a stone before she arrived at home, states that she is continuing to have pain to right flank.  States that she feels "dizzy" and nauseated right now.

## 2009-06-08 LAB — METABOLIC PANEL, BASIC
Anion gap: 14 mmol/L (ref 7–16)
BUN: 13 MG/DL (ref 7–18)
CO2: 24 MMOL/L (ref 21–32)
Calcium: 9.8 MG/DL (ref 8.4–10.4)
Chloride: 101 MMOL/L (ref 98–107)
Creatinine: 1.1 MG/DL — ABNORMAL HIGH (ref 0.6–1.0)
GFR est AA: >60 mL/min/{1.73_m2} (ref >60)
GFR est non-AA: >60 mL/min/{1.73_m2} (ref >60)
Glucose: 105 MG/DL — ABNORMAL HIGH (ref 65–100)
Potassium: 3.8 MMOL/L (ref 3.5–5.1)
Sodium: 139 MMOL/L (ref 136–145)

## 2009-06-08 LAB — CBC WITH AUTOMATED DIFF
ABS. BASOPHILS: 0 10*3/uL (ref 0.0–0.2)
ABS. EOSINOPHILS: 0 10*3/uL (ref 0.0–0.8)
ABS. IMM. GRANS.: 0 10*3/uL (ref 0.0–2.0)
ABS. LYMPHOCYTES: 1.5 10*3/uL (ref 0.5–4.6)
ABS. MONOCYTES: 0.5 10*3/uL (ref 0.1–1.3)
ABS. NEUTROPHILS: 7.3 10*3/uL (ref 1.7–8.2)
BASOPHILS: 0 % (ref 0.0–2.0)
EOSINOPHILS: 0 % — ABNORMAL LOW (ref 0.5–7.8)
HCT: 33.9 % — ABNORMAL LOW (ref 37.6–48.3)
HGB: 11.4 g/dL — ABNORMAL LOW (ref 11.7–15.0)
IMMATURE GRANULOCYTES: 0.2 % (ref 0.0–2.0)
LYMPHOCYTES: 16 % (ref 13–44)
MCH: 31.1 PG (ref 26.1–32.9)
MCHC: 33.6 g/dL (ref 31.4–35.0)
MCV: 92.6 FL (ref 79.6–97.8)
MONOCYTES: 5 % (ref 4.0–12.0)
MPV: 9.4 FL — ABNORMAL LOW (ref 10.8–14.1)
NEUTROPHILS: 79 % — ABNORMAL HIGH (ref 43–78)
PLATELET: 242 10*3/uL (ref 140–440)
RBC: 3.66 M/uL — ABNORMAL LOW (ref 3.86–5.18)
RDW: 12.9 % (ref 11.9–14.6)
WBC: 9.3 10*3/uL (ref 4.0–10.5)

## 2009-06-08 LAB — URINE MICROSCOPIC
Casts: 0 /LPF
Crystals, urine: 0 /LPF
Epithelial cells: 0 /HPF
Mucus: 0 /LPF

## 2009-06-08 MED ORDER — HYDROCODONE-ACETAMINOPHEN 5 MG-500 MG TAB
5-500 mg | ORAL_TABLET | Freq: Four times a day (QID) | ORAL | Status: AC | PRN
Start: 2009-06-08 — End: 2009-06-15

## 2009-06-08 MED ADMIN — nalbuphine (NUBAIN) injection 10 mg: INTRAVENOUS | @ 06:00:00 | NDC 00409146301

## 2009-06-08 MED ADMIN — sodium chloride 0.9 % bolus infusion 1,000 mL: INTRAVENOUS | @ 06:00:00 | NDC 00409798309

## 2009-06-08 MED FILL — NALBUPHINE 10 MG/ML INJECTION: 10 mg/mL | INTRAMUSCULAR | Qty: 1

## 2009-06-08 NOTE — ED Notes (Signed)
Back from ultrasound, iv clotted when pt got up to go to bathroom. Removed.

## 2009-06-08 NOTE — ED Notes (Signed)
Pt states she is [redacted] weeks pregnant and has kidney stones with every pregnancy x 3. No problems with pregnancy so far d none with other pregnancies

## 2009-06-08 NOTE — ED Notes (Signed)
Patient got 200 ml before iv clotted in ultrasound.I have reviewed discharge instructions with the patient.  The patient verbalized understanding.

## 2009-06-08 NOTE — ED Notes (Signed)
Patient feeling much better and pain level is continuing to go down.

## 2009-06-08 NOTE — ED Notes (Signed)
Patient feeling some better but pain still at 6 but she is able to lay quietly at present.

## 2009-06-08 NOTE — ED Notes (Signed)
To ultrasound

## 2009-06-08 NOTE — ED Provider Notes (Cosign Needed)
HPI Comments: Pt c/o of left flank pain since 5 pm. Pt states nauseated and vomited x3 tonight. Pt is pregnant, has hx of kidney stones during pregnancy. Pt been seen by urologist in the past, doesn't have appt until 06/14/09. Pt denies fever or chills. Pt was seen here on 06/01/09 for uti, states been taking her antibiotics as prescribed, denies any urinary sx at this time; Pt denies vaginal bleeding , denies vaginal discharge;    Patient is a 28 y.o. female presenting with flank pain and nausea. The history is provided by the patient.   Flank Pain   This is a recurrent problem. The current episode started 3 to 5 hours ago. The problem has not changed since onset. The problem occurs constantly. Patient reports no work related injury.The pain is associated with no known injury. The pain is present in the right side. The quality of the pain is described as stabbing. The pain does not radiate. The pain is at a severity of 10/10. The pain is moderate. The pain is the same all the time. Pertinent negatives include no chest pain, no fever, no numbness, no weight loss, no headaches, no abdominal pain, no abdominal swelling, no bowel incontinence, no perianal numbness, no bladder incontinence, no dysuria, no pelvic pain, no paresthesias, no paresis, no tingling and no weakness. She has tried nothing for the symptoms. The treatment provided no relief. Risk factors include history of kidney stones and pyelonephritis. The patient's surgical history non-contributory   Nausea    This is a recurrent problem. The current episode started 3 to 5 hours ago. The problem occurs 2 to 4 times per day. The problem has not changed since onset. The emesis has an appearance of stomach contents (vomited x3). There has been no fever. Pertinent negatives include no chills, no fever, no sweats, no abdominal pain, no diarrhea, no headaches, no arthralgias, no myalgias, no cough and no URI. Yes, the patient is pregnant.Risk factors: pregnancy. Her pertinent negatives include no irritable bowel syndrome, no inflammatory bowel disease, no short gut syndrome, no bowel resection, no recent abdominal surgery, no malabsorption, no gastric bypass and no DM.        Past Medical History   Diagnosis Date   ??? Postpartum fever 05/09/2008   ??? Anemia of pregnancy 05/09/2008   ??? Previous cesarean delivery, delivered 05/09/2008   ??? Chronic kidney disease      kidney stones   ??? Other ill-defined conditions      kidney stones          Past Surgical History   Procedure Date   ??? Cesarean delivery only    ??? Hx urological      lithotripsy   ??? Hx heent      tonsils   ??? Hx other surgical      lithotripsy   ??? Hx cesarean section      x3           No family history on file.     History   Social History   ??? Marital Status: Single     Spouse Name: N/A     Number of Children: N/A   ??? Years of Education: N/A   Occupational History   ??? Not on file.   Social History Main Topics   ??? Tobacco Use: Quit -- 0.2 packs/day   ??? Alcohol Use: No   ??? Drug Use: No   ??? Sexually Active: No   Other Topics Concern   ???  Not on file   Social History Narrative   ??? No narrative on file           ALLERGIES: Meperidine      Review of Systems   Constitutional: Negative.  Negative for fever, chills, weight loss, diaphoresis, activity change, appetite change, fatigue and unexpected weight change.   HENT: Negative.    Eyes: Negative.    Respiratory: Negative.  Negative for apnea, cough, choking, chest tightness, shortness of breath, wheezing and stridor.     Cardiovascular: Negative.  Negative for chest pain, palpitations and leg swelling.   Gastrointestinal: Positive for nausea and vomiting. Negative for abdominal pain, diarrhea, constipation, blood in stool, abdominal distention, anal bleeding and rectal pain.   Genitourinary: Positive for flank pain. Negative for bladder incontinence, dysuria, urgency, frequency, hematuria, decreased urine volume, vaginal bleeding, vaginal discharge, enuresis, difficulty urinating, genital sore, vaginal pain, menstrual problem, pelvic pain and dyspareunia.   Musculoskeletal: Negative.  Negative for myalgias and arthralgias.   Skin: Negative.    Neurological: Negative.  Negative for dizziness, tingling, tremors, seizures, syncope, facial asymmetry, speech difficulty, weakness, light-headedness, numbness and headaches.   Hematological: Negative.    Psychiatric/Behavioral: Negative.    All other systems reviewed and are negative.        Filed Vitals:    06/07/2009 11:27 PM 06/07/2009 11:29 PM   BP: 132/62    Pulse: 86    Temp: 97.1 ??F (36.2 ??C)    Resp: 20    Height:  6' (1.829 m)   Weight:  168 lb (76.204 kg)   SpO2: 100%               Physical Exam   Nursing note and vitals reviewed.  Constitutional: She is oriented to person, place, and time. She appears well-developed and well-nourished. She appears distressed.   HENT:   Head: Normocephalic.   Eyes: Pupils are equal, round, and reactive to light.   Neck: Normal range of motion. Neck supple.   Cardiovascular: Normal rate, regular rhythm, normal heart sounds and intact distal pulses.    Pulmonary/Chest: Effort normal and breath sounds normal. No respiratory distress. She has no wheezes. She has no rales. She exhibits no tenderness.    Abdominal: Soft. Normal appearance and bowel sounds are normal. She exhibits no distension and no mass. There is no organomegaly, splenomegaly or hepatomegaly. No tenderness. She has CVA tenderness. She has no rebound, no guarding, no pain at McBurney's point and no Murphy's sign. No hernia. Hernia confirmed negative in the ventral area, confirmed negative in the right inguinal area and confirmed negative in the left inguinal area.          Musculoskeletal: Normal range of motion.   Neurological: She is alert and oriented to person, place, and time. She has normal reflexes.   Skin: Skin is warm and dry. She is not diaphoretic.   Psychiatric: She has a normal mood and affect. Her behavior is normal. Judgment and thought content normal.        MDM Coding   Reviewed: previous chart, nursing note and vitals  Interpretation: labs and ultrasound        Procedures    Results Include:    Recent Results (from the past 24 hour(s))   URINE MICROSCOPIC    Collection Time    06/07/09 11:34 PM   Component Value Range   ??? WBC 0-3  0 (/HPF)   ??? RBC 5-10  0 (/HPF)   ??? Epithelial cells  0  0 (/HPF)   ??? Bacteria 1+ (*) 0 (/HPF)   ??? Casts 0  0 (/LPF)   ??? Crystals 0  0 (/LPF)   ??? Amorphous Crystals 1+ (*) 0 ( )   ??? Mucus 0  0 (/LPF)   CBC WITH AUTOMATED DIFF    Collection Time    06/08/09 12:05 AM   Component Value Range   ??? WBC 9.3  4.0 - 10.5 (K/uL)   ??? RBC 3.66 (*) 3.86 - 5.18 (M/uL)   ??? HGB 11.4 (*) 11.7 - 15.0 (g/dL)   ??? HCT 33.9 (*) 37.6 - 48.3 (%)   ??? MCV 92.6  79.6 - 97.8 (FL)   ??? MCH 31.1  26.1 - 32.9 (PG)   ??? MCHC 33.6  31.4 - 35.0 (g/dL)   ??? RDW 12.9  11.9 - 14.6 (%)   ??? PLATELET 242  140 - 440 (K/uL)   ??? MPV 9.4 (*) 10.8 - 14.1 (FL)   ??? DF AUTOMATED     ??? NEUTROPHILS 79 (*) 43 - 78 (%)   ??? LYMPHOCYTES 16  13 - 44 (%)   ??? MONOCYTES 5  4.0 - 12.0 (%)   ??? EOSINOPHILS 0 (*) 0.5 - 7.8 (%)   ??? BASOPHILS 0  0.0 - 2.0 (%)   ??? ABSOLUTE NEUTS 7.3  1.7 - 8.2 (K/UL)   ??? ABSOLUTE LYMPHS 1.5  0.5 - 4.6 (K/UL)    ??? ABSOLUTE MONOS 0.5  0.1 - 1.3 (K/UL)   ??? ABSOLUTE EOSINS 0.0  0.0 - 0.8 (K/UL)   ??? ABSOLUTE BASOS 0.0  0.0 - 0.2 (K/UL)   ??? IMM. GRANS. 0.2  0.0 - 2.0 (%)   ??? ABS. IMM. GRANS. 0.0  0.0 - 2.0 (K/UL)   METABOLIC PANEL, BASIC    Collection Time    06/08/09 12:05 AM   Component Value Range   ??? Sodium 139  136 - 145 (MMOL/L)   ??? Potassium 3.8  3.5 - 5.1 (MMOL/L)   ??? Chloride 101  98 - 107 (MMOL/L)   ??? CO2 24  21 - 32 (MMOL/L)   ??? Anion gap 14  7 - 16 (mmol/L)   ??? Glucose 105 (*) 65 - 100 (MG/DL)   ??? BUN 13  7 - 18 (MG/DL)   ??? Creatinine 1.1 (*) 0.6 - 1.0 (MG/DL)   ??? GFR est AA >60  >60 (ml/min/1.18m2)   ??? GFR est non-AA >60  >60 (ml/min/1.11m2)   ??? Calcium 9.8  8.4 - 10.4 (MG/DL)       Nubain given with good effect;  Renal US show mod bilat hydronephrosis, which may be related to pt's pregnancy, bilateral ureteral jets noted without definite ureteral stones identified, bilat nephrolithiasis, r greater than left;    Pt to call her urologist Dr. Stacie Acres tomorrow am for f/u;    I have discussed all today's findings,  procedures, radiographs, treatments as well as any previous results found within the [facility]. [employer] with the patient and available family.?? A treatment plan was developed in conjunction with the patient and was agreed upon. The patient is ready for discharge at this time.?? All voiced understanding of the discharge plan and medication instructions or changes as appropriate.?? Questions about treatment in the ED were answered.?? The patient was encouraged to return should symptoms worsen or new problems develop. A follow up physician was provided to the patient on the discharge papers.

## 2009-07-17 NOTE — L&D Delivery Note (Signed)
CESAREAN SECTION OPERATIVE REPORT    OPERATIVE REPORT : This is a report of operation performed on Denise Turner on 11/17/2009 at [facility].     DATE OF SURGERY: 11/17/2009    MEDICAL RECORD NUMBER: [id number]   PREOPERATIVE DIAGNOSIS:  previous uterine incision, IUP at term, Previous C-Section, Patient desires repeat C-Section, Patient desires permanent method of sterilization  POSTOPERATIVE DIAGNOSIS: Same.    OPERATIVE PROCEDURE: Procedure(s):  CESAREAN SECTION + Bilateral Tubal Sterilization via Pomeroy's Technique  Surgeon(s) and Role:     * Lakeesha Fontanilla Anson Fret, MD - Primary  ASSISTANT(S): Eustace Moore, RN - Circ-1  HERBERTAURUS Norris Cross - Scrub Tech-1  Jamse Arn - Scrub Tech-2  Gwinda Passe, RN - Circ-2        ANESTHESIA TYPE: Spinal   ESTIMATED BLOOD LOSS: Estimated blood loss was about 900 cc.    SPECIMENS:   ID Type Source Tests Collected by Time Destination   1 : RIGHT FALLOPIAN TUBE SEGMENT Fresh Fallopian Tube  Camryn Quesinberry D 11/17/2009 1415 Pathology   2 : LEFT FALLOPIAN TUBE SEGMENT Fresh Fallopian Tube  Grettell Ransdell D 11/17/2009 1416 Pathology       COMPLICATIONS: None  FINDINGS: Delivery of a viable female baby on 11/17/2009  at  . Placenta delivered at  .   Information for the patient's newborn:   Vihana, Kydd [[id number]]   One Minute Apgar: 8  (Filed from Delivery Summary)  Five  Minute Apgar: 9  (Filed from Delivery Summary)    The operative report reads as under.   The patient was taken to the operating room and was placed in supine position. Preoperative labs and consent were verified.  An indwelling urethral catheter was placed in bladder for continuous bladder drainage.    Patient was prepped and draped in sterile fashion.   The operation was begun by using a a Pfannenstiel type of incision. The incision was low transverse, slightly curvilinear at the level of pubic hair line, that extended somewhat beyond the lateral border of the rectus muscles. The fascia was nicked in midline and then incised transversely for full length of incision with curved Mayo scissors. The superior and inferior aspects of fascial incision were grasped with kocher clamps, elevated and underlying rectus muscles were dissected off bluntly. The peritoneum was tented up and opened sharply with Metzenbaum scissors. The peritoneal cavity was entered by extending peritoneal incision.  Meticulous hemostasis was observed.    After entering abdominal cavity, uterus was quickly but carefully palpated to identify the size and presenting part of the fetus and any uterine rotation was corrected at this time. The loose reflection of the peritoneum above the upper margin of the bladder and overlying the anterior lower uterine segment was grasped in the midline with the forceps and incised with the Metzenbaum scissors. This incision was extended laterally and the bladder flap was created digitally. The bladder flap was held downwards using a bladder retractor.   The uterus was opened through the lower uterine segment, about 2 cms. above the detached bladder. The uterine incision was made with scalpel through the exposed lower uterine segment transversely. This was done carefully, so as to cut completely through the uterine wall but not deeply enough to wound the underlying fetus. Suctioning of the operative field was done by an Geophysicist/field seismologist. The uterine incision was then extended by cutting laterally and slightly upwards with bandage scissors.    The retractor was removed and a hand  was slipped in to the uterine cavity between symphysis pubis and fetal head. The head was gently elevated with the fingers and palm. Through the incision, the exposed nares and mouth were aspirated with a bulb syringe before thorax was delivered. The shoulders were then delivered using gentle traction plus fundal pressure. The rest of the body followed spontaneously. Baby cried immediately.   The cord was clamped between two clamps and cut, with the infant held at the level of the abdominal wall. The infant was given to [facility] team for further resuscitative efforts. A sample for cord blood evaluation and cord blood gas were obtained from the placental end of the cord. The placenta was expressed and delivered. Fundal massage was done and pitocin was infused via dilute infusion to aid decreased bleeding.   Two cut ends of the uterine wall were secured with Allies clamps. Uterus was closed in two layers with #0 chromic catgut on round needle in continuous running - lock fashion. The visceral peritoneum was approximated using #00 chromic catgut in continuous running - lock fashion.     The right fallopian tube was identified. The fimbrial end was identified. A loop of tube was made using Babcock Forcep about 3-4 cms. Away from uterotubal junction. It was double tied using #0 plain catgut. A small segment of tube was removed above li gated loop. Hemostasis was secured. Similar procedure was done on opposite tube. Cut segments of right and left fallopian tubes were sent for pathologic evaluation.    Abdominal cavity was cleaned with two packs and all the blood clots were removed. The cavity was irrigated. The parietal peritoneum was closed using #00 chromic catgut on a round needle in continuous running fashion. The fascia was sutured using 0 Vicryl in continuous fashion. Skin was approximated with staples. Sterile dressing was applied at the site of surgery.    Sponges, laps, instruments, and needle count was correct times two.  Urine was clear at the end of the surgery in foley bag.   Patient seemed to tolerate surgery and anesthesia very well without any complications.  The patient was transferred to recovery room in stable condition.     Signed By: Kaleen Odea, M.D.             April 04, 2010

## 2009-07-20 LAB — POC URINE DIPSTICK MANUAL
Glucose, urine (POC): NEGATIVE
Ketones (POC): NEGATIVE
Protein (POC): NEGATIVE

## 2009-07-20 MED ADMIN — promethazine (PHENERGAN) injection 25 mg: INTRAMUSCULAR | @ 14:00:00 | NDC 00641092821

## 2009-07-20 MED ADMIN — butorphanol (STADOL) injection 1 mg: INTRAMUSCULAR | @ 14:00:00 | NDC 55390018301

## 2009-07-20 MED FILL — BUTORPHANOL TARTRATE 1 MG/ML INJECTION: 1 mg/mL | INTRAMUSCULAR | Qty: 1

## 2009-07-20 MED FILL — PROMETHAZINE 25 MG/ML INJECTION: 25 mg/mL | INTRAMUSCULAR | Qty: 1

## 2009-07-20 NOTE — Progress Notes (Signed)
Pt to LDR triage with complaint of kidney stone, pain 10/10

## 2009-07-20 NOTE — Progress Notes (Signed)
Dr. Samuella Cota given report. Orders received for po hydration and pain medication.

## 2009-07-20 NOTE — Progress Notes (Signed)
Urine dip:  Protein negative  Glucose negative  Ketones negative  Blood negative    Urine clear yellow urine.

## 2009-07-20 NOTE — Progress Notes (Signed)
Dr. Pandya paged.

## 2009-07-20 NOTE — Progress Notes (Signed)
Discharge teaching complete. Pt verbalizes understanding. Pt reports pain relief of 2 out of 10 on a pain scale at the present time. Patients mother on the way to pick her up patient to call when ride here.

## 2009-07-20 NOTE — Progress Notes (Signed)
External fetal heart tones in the 160's appropriate for gestational age.

## 2009-07-20 NOTE — Progress Notes (Signed)
Patient states that she has been hurting x three days and that she has Lortab 5mg  at home to take. She last took pain medication yesterday morning at 0700. She hasn't tried lortab or tylenol this morning.

## 2009-07-20 NOTE — Progress Notes (Signed)
Pain medication administered per order. Pitcher of water provided for po hydration. Pt instructed to call prn needs. Call light with in reach, side rails up x 2

## 2009-07-20 NOTE — Progress Notes (Signed)
Discharge teaching signed and a copy placed in medical records, e-sign down.

## 2009-08-21 ENCOUNTER — Ambulatory Visit
Admit: 2009-08-21 | Discharge: 2009-08-21 | Disposition: A | Discharge status: Home / Self Care | Source: Ambulatory Visit

## 2009-08-21 LAB — INFLUENZA A & B AG (RAPID TEST)
Influenza A Ag: NEGATIVE
Influenza B Ag: NEGATIVE

## 2009-08-21 MED ADMIN — acetaminophen (TYLENOL) tablet 1,000 mg: ORAL | @ 21:00:00 | NDC 51645070610

## 2009-08-21 MED FILL — ACETAMINOPHEN 500 MG TAB: 500 mg | ORAL | Qty: 2

## 2009-08-21 NOTE — Progress Notes (Signed)
Discharge instructions given.  Pt states understanding.

## 2009-08-21 NOTE — Progress Notes (Signed)
Reactive NST obtained.  Monitoring off.  Pt states she feels better.  Has been able to keep down an arbys sandwich and water and juice.  Has already called her ride.  No results yet for the Rapid flu swab.

## 2009-08-21 NOTE — Progress Notes (Signed)
Dr Mikey Bussing on phone.  Status reported.  This pt called Dr Mikey Bussing twice this AM and was instructed to take tylenol and rest.  Pt stated that she had no tylenol at home and just feels bad.  Orders received for flu swab and tylenol po.

## 2009-08-21 NOTE — Progress Notes (Signed)
Admit to triage for evaluation of contractions, HA, N/V, and back pains.

## 2009-08-21 NOTE — Progress Notes (Signed)
Flu swab obtained from nares.  Pt given water and juice po to drink.  Has own food to eat.  Encouraged to eat and hydrate self.  Tylenol 1000 mg po given.

## 2009-08-21 NOTE — Progress Notes (Signed)
Discharge to home with friend.  Pt ambulate to personal auto.

## 2009-08-21 NOTE — Progress Notes (Signed)
Dr Mikey Bussing called.

## 2009-08-21 NOTE — Progress Notes (Signed)
Urine dip with trace amt of blood and trace amt protein.  SVE FT externally.

## 2009-08-21 NOTE — Progress Notes (Signed)
Flu swab is negative.

## 2009-09-14 NOTE — Progress Notes (Signed)
Pt to L&D triage via wheelchair.  Pt's chief complaints consists of kidney stones .  Pt appears uncomfortable on admission

## 2009-09-14 NOTE — Progress Notes (Signed)
stadol1 mg and phenergan 25 mg im given per order dc instructions given pt left resting side rails up x2 aware to call if needs change

## 2009-09-14 NOTE — Progress Notes (Signed)
Pt states feels better family here discharged to home via W/C in stable condition

## 2009-09-14 NOTE — Progress Notes (Signed)
Dr pandya called and notifyed of pt arrival and condition orders received for pain medication tan dc to home if pt feels better.

## 2009-09-15 ENCOUNTER — Ambulatory Visit
Admit: 2009-09-15 | Discharge: 2009-09-15 | Disposition: A | Discharge status: Home / Self Care | Source: Ambulatory Visit

## 2009-09-15 MED ADMIN — promethazine (PHENERGAN) injection 25 mg: INTRAMUSCULAR | @ 02:00:00 | NDC 00641092821

## 2009-09-15 MED ADMIN — butorphanol (STADOL) injection 1 mg: INTRAMUSCULAR | @ 02:00:00 | NDC 55390018301

## 2009-09-15 MED FILL — PROMETHAZINE 25 MG/ML INJECTION: 25 mg/mL | INTRAMUSCULAR | Qty: 1

## 2009-09-15 MED FILL — BUTORPHANOL TARTRATE 1 MG/ML INJECTION: 1 mg/mL | INTRAMUSCULAR | Qty: 1

## 2009-09-17 ENCOUNTER — Ambulatory Visit
Admit: 2009-09-17 | Discharge: 2009-09-17 | Disposition: A | Discharge status: Home / Self Care | Source: Ambulatory Visit

## 2009-09-17 LAB — POC URINE DIPSTICK MANUAL
Glucose, urine (POC): NEGATIVE
Ketones (POC): NEGATIVE
Protein (POC): NEGATIVE

## 2009-09-17 MED ADMIN — butorphanol (STADOL) injection 1 mg: INTRAMUSCULAR | @ 15:00:00 | NDC 55390018301

## 2009-09-17 MED ADMIN — promethazine (PHENERGAN) injection 25 mg: INTRAMUSCULAR | @ 15:00:00 | NDC 00641092821

## 2009-09-17 MED FILL — PROMETHAZINE 25 MG/ML INJECTION: 25 mg/mL | INTRAMUSCULAR | Qty: 1

## 2009-09-17 MED FILL — BUTORPHANOL TARTRATE 1 MG/ML INJECTION: 1 mg/mL | INTRAMUSCULAR | Qty: 1

## 2009-09-17 NOTE — Progress Notes (Signed)
Report given to Dr. Samuella Cota. Orders received to discharge patient home with instructions to follow up with her urologist for management of kidney stone throughout pregnancy and after.

## 2009-09-17 NOTE — Progress Notes (Signed)
Discharge instructions given. Patient denies questions at this time. Patient discharged home

## 2009-09-17 NOTE — Progress Notes (Signed)
Patient states pain decreased to 6/10. Patient sleeping upon RN entering room. Patient states she doesn't think she needs any more pain medication. Dr. Samuella Cota paged

## 2009-09-17 NOTE — Progress Notes (Signed)
Report given to Dr. Samuella Cota. Orders received for stadol and phenergan.  If pain not resolved, place IV and give 1 liter LR bolus and call MD with results. Call MD with results if pain resolved with stadol and phenergan.

## 2009-09-17 NOTE — Progress Notes (Signed)
Dr Pandya paged

## 2009-09-17 NOTE — Progress Notes (Signed)
Patient presents to LD for right sided flank pain. Patient states she has had kidney stones in the past and it feels the same. Patient stated pain started at 0515 this am. Pain 10/10

## 2009-09-17 NOTE — Progress Notes (Signed)
Patient states pain has gone from 10/10 to 7/10. Report given to Dr. Samuella Cota. Orders received to give patient IV LR 1liter bolus.

## 2009-09-17 NOTE — Progress Notes (Signed)
Plan of care discussed. Patient denies questions at this time.

## 2009-10-16 NOTE — Progress Notes (Signed)
Pt presents to triage with complaints of increased blood in urine since her stent placement on Monday. EFM placed. +FM noted. Pt does complain of some contractions every 10 minutes, but she states she has been contracting since her discharge on Monday. Her last cervical exam was 1 cm. Pt denies any vaginal discharge or any vaginal bleeding. Pt denies any increases in pain since surgery. Pt states the urine bleeding is dark brown. Urine specimen left was pink tinged urine. Triage assessment completed. VSS. Pt afebrile.

## 2009-10-16 NOTE — Progress Notes (Signed)
Dr. Tacey Ruiz notified orders received to give pain medication if she so desires, to encourage water intake, to return for any increase in urethral bleeding or contractions, offer reassurance, and discharge to home.

## 2009-10-16 NOTE — Progress Notes (Signed)
Pt given discharge instructions including to increase water intake. Pt verbalized understanding. Pt given IM injection of stadol 2 mg and phenergan 25 mg in right gluteus maximus. Pt allergic to Demerol, but states she can take Stadol with a problem. Pt discharged via wheelchair with mother. Pt in no apparent distress at discharge.

## 2009-10-17 ENCOUNTER — Ambulatory Visit
Admit: 2009-10-17 | Discharge: 2009-10-17 | Disposition: A | Discharge status: Home / Self Care | Source: Ambulatory Visit

## 2009-10-17 LAB — POC URINE DIPSTICK MANUAL
Glucose, urine (POC): NEGATIVE
Ketones (POC): NEGATIVE
Protein (POC): 30

## 2009-10-17 MED ADMIN — promethazine (PHENERGAN) injection 25 mg: INTRAMUSCULAR | @ 02:00:00 | NDC 00641092821

## 2009-10-17 MED ADMIN — butorphanol (STADOL) injection 2 mg: INTRAMUSCULAR | @ 02:00:00 | NDC 00409162601

## 2009-10-17 MED FILL — BUTORPHANOL TARTRATE 2 MG/ML IJ SOLN: 2 mg/mL | INTRAMUSCULAR | Qty: 1

## 2009-10-17 MED FILL — PROMETHAZINE 25 MG/ML INJECTION: 25 mg/mL | INTRAMUSCULAR | Qty: 1

## 2009-11-02 NOTE — Progress Notes (Signed)
Pt given 1mg  Stadol and 25mg  Phenergan IM in left hip for c/o pain 10/10.

## 2009-11-02 NOTE — Progress Notes (Signed)
Pt states pain relief 7/10.  Pt states she still hurts some, but would like to go home.  Discharge instructions given and pt verbalized and signed for understanding.  Questions encouraged.  Pt discharged to home with brother via wheelchair.  Stable at discharge.

## 2009-11-02 NOTE — Progress Notes (Signed)
Pt to triage for c/o contractions and diarrhea x3 days.  Pt connected to EFM and TOCO.  FHR 135 and reactive.    UC's noted every 2.5-3 minutes.  SVE 1/70/-3.  Pt states she has had diarrhea 6 times today.  Pt notes that she is urinating frequently, but states she had a stent placed 10/11/09.  Dr. Samuella Cota paged and informed of pt status.  Orders received.

## 2009-11-03 ENCOUNTER — Ambulatory Visit
Admit: 2009-11-03 | Discharge: 2009-11-03 | Disposition: A | Discharge status: Home / Self Care | Source: Ambulatory Visit

## 2009-11-03 LAB — POC URINE DIPSTICK MANUAL
Glucose, urine (POC): NEGATIVE
Ketones (POC): NEGATIVE
Protein (POC): 30

## 2009-11-03 MED ADMIN — butorphanol (STADOL) injection 1 mg: INTRAMUSCULAR | @ 03:00:00 | NDC 55390018301

## 2009-11-03 MED ADMIN — promethazine (PHENERGAN) injection 25 mg: INTRAMUSCULAR | @ 03:00:00 | NDC 00641092821

## 2009-11-03 MED FILL — BUTORPHANOL TARTRATE 1 MG/ML INJECTION: 1 mg/mL | INTRAMUSCULAR | Qty: 1

## 2009-11-03 MED FILL — PROMETHAZINE 25 MG/ML INJECTION: 25 mg/mL | INTRAMUSCULAR | Qty: 1

## 2009-11-09 ENCOUNTER — Observation Stay
Admit: 2009-11-09 | Discharge: 2009-11-10 | Disposition: A | Discharge status: Home / Self Care | Source: Ambulatory Visit

## 2009-11-09 LAB — POC URINE DIPSTICK MANUAL
Glucose, urine (POC): NEGATIVE
Ketones (POC): NEGATIVE
Protein (POC): 100

## 2009-11-09 MED FILL — BUTORPHANOL TARTRATE 1 MG/ML INJECTION: 1 mg/mL | INTRAMUSCULAR | Qty: 1

## 2009-11-09 MED FILL — PROMETHAZINE 25 MG/ML INJECTION: 25 mg/mL | INTRAMUSCULAR | Qty: 1

## 2009-11-09 NOTE — Progress Notes (Signed)
Pt refused pain medication, wants to go home.  Dr Samuella Cota.

## 2009-11-09 NOTE — Progress Notes (Signed)
Pt moved to room 435 for 23 hour observation admission.  Pt instructed to call with needs or complaints.

## 2009-11-09 NOTE — Progress Notes (Signed)
Dr Samuella Cota on phone, orders received to start IV and run LR at 150 cc/hr.

## 2009-11-09 NOTE — Progress Notes (Signed)
Dr Samuella Cota on phone report given on contractions and pain level, orders received to admit for 23 hour observation.

## 2009-11-09 NOTE — Progress Notes (Signed)
Dr Samuella Cota on phone, report given of nitrazene results, SVE, contractions, and urine dip stick results.  Orders received for stadol and phenergan and to call MD back 20 minutes after pain medication given.

## 2009-11-09 NOTE — Progress Notes (Signed)
Dr Samuella Cota on phone, report given of pt refusal of pt pain medication.  Order receive to explain to pt again reason for pain medication, cannot perform cesarean section without rupture of membranes before 39 weeks.

## 2009-11-09 NOTE — Progress Notes (Signed)
Report received from Brigid Re, RN. Care assumed of pt.

## 2009-11-09 NOTE — Progress Notes (Signed)
Dr Samuella Cota on phone, MD will come to hospital within an hour to see pt.

## 2009-11-09 NOTE — Progress Notes (Signed)
Dr Samuella Cota on phone orders received to let pt eat.  MD will see pt in AM.

## 2009-11-09 NOTE — Progress Notes (Signed)
Admission assessment completed as charted.  SVE done 1/50/-3.  Nitrazene negative.  EFM and TOCO applied.  Dr Samuella Cota paged.

## 2009-11-09 NOTE — Progress Notes (Signed)
Dr Pandya paged.

## 2009-11-10 MED ADMIN — lactated ringers infusion: INTRAVENOUS | @ 01:00:00 | NDC 00409795309

## 2009-11-10 MED ADMIN — lactated ringers infusion: INTRAVENOUS | NDC 00409795309

## 2009-11-10 NOTE — Progress Notes (Signed)
Pt resting quietly. Respirations even and unlabored.  Pt resting quietly. Respirations even and unlabored.

## 2009-11-10 NOTE — Progress Notes (Signed)
Pt given discharge instructions and given an opportunity to ask questions. Pt has none at this time.

## 2009-11-10 NOTE — Progress Notes (Signed)
Report received from [facility] rn Pt request shower will cal Dr

## 2009-11-10 NOTE — Progress Notes (Signed)
Pt up to bathroom. Denies needs. Pt does complain of occasional contraction 10/10. Denies need for any medication. Pt smiling and talking through contraction. Pt encouraged to call out with any needs.

## 2009-11-10 NOTE — Progress Notes (Signed)
Dr Samuella Cota on phone report of ctx q 9 min with some irritability between fht appropriate pt request a shower Orders to Discharge pt and reassure her come back when ctx are closer together.

## 2009-11-10 NOTE — Progress Notes (Signed)
Pt assisted to bathroom. Denies complaints or needs at this time.

## 2009-11-14 ENCOUNTER — Ambulatory Visit
Admit: 2009-11-14 | Discharge: 2009-11-14 | Disposition: A | Discharge status: Home / Self Care | Source: Ambulatory Visit

## 2009-11-14 MED ADMIN — diphenoxylate-atropine (LOMOTIL) tablet 2 Tab: ORAL | @ 20:00:00 | NDC 51079006701

## 2009-11-14 MED FILL — DIPHENOXYLATE-ATROPINE 2.5 MG-0.025 MG TAB: ORAL | Qty: 2

## 2009-11-14 NOTE — Progress Notes (Signed)
Pt Presented with rectal pressure as had 5 loose stools 4/30 and 2 today.  Dr Farrell Ours called orders received.

## 2009-11-17 ENCOUNTER — Inpatient Hospital Stay
Admit: 2009-11-17 | Discharge: 2009-11-19 | Disposition: A | Discharge status: Home / Self Care | Source: Ambulatory Visit

## 2009-11-17 DIAGNOSIS — IMO0001 Reserved for inherently not codable concepts without codable children: Secondary | ICD-10-CM

## 2009-11-17 LAB — TYPE AND SCREEN
ABO/Rh: A POS
Antibody Screen: NEGATIVE

## 2009-11-17 LAB — CBC WITH AUTOMATED DIFF
ABS. BASOPHILS: 0 10*3/uL (ref 0.0–0.2)
ABS. EOSINOPHILS: 0.1 10*3/uL (ref 0.0–0.8)
ABS. IMM. GRANS.: 0 10*3/uL (ref 0.0–2.0)
ABS. LYMPHOCYTES: 1.4 10*3/uL (ref 0.5–4.6)
ABS. MONOCYTES: 0.6 10*3/uL (ref 0.1–1.3)
ABS. NEUTROPHILS: 4.2 10*3/uL (ref 1.7–8.2)
BASOPHILS: 0 % (ref 0.0–2.0)
EOSINOPHILS: 1 % (ref 0.5–7.8)
HCT: 37.2 % — ABNORMAL LOW (ref 37.6–48.3)
HGB: 12.2 g/dL (ref 11.7–15.0)
IMMATURE GRANULOCYTES: 0.6 % (ref 0.0–2.0)
LYMPHOCYTES: 22 % (ref 13–44)
MCH: 30.9 PG (ref 26.1–32.9)
MCHC: 32.8 g/dL (ref 31.4–35.0)
MCV: 94.2 FL (ref 79.6–97.8)
MONOCYTES: 9 % (ref 4.0–12.0)
MPV: 10.3 FL — ABNORMAL LOW (ref 10.8–14.1)
NEUTROPHILS: 67 % (ref 43–78)
PLATELET: 186 10*3/uL (ref 140–440)
RBC: 3.95 M/uL (ref 3.86–5.18)
RDW: 14.1 % (ref 11.9–14.6)
WBC: 6.4 10*3/uL (ref 4.0–10.5)

## 2009-11-17 LAB — BLOOD GAS, ARTERIAL CORD
BASE DEFICIT,CBA: 0.9 mmol/L (ref 0.0–2.0)
HCO3,CORD BLD ARTERIAL: 26 mmol/L (ref 22–26)
PCO2,CORD BLD ARTERIAL: 51 mmHg — ABNORMAL HIGH (ref 33–49)
PH,CORD BLD ARTERIAL: 7.323 — ABNORMAL HIGH (ref 7.21–7.31)
PO2,CORD BLD ARTERIAL: <36 mmHg — ABNORMAL HIGH (ref 9–19)

## 2009-11-17 LAB — TYPE & SCREEN
ABO/Rh(D): A POS
Antibody screen: NEGATIVE

## 2009-11-17 MED ORDER — SODIUM CHLORIDE 0.9 % IV PIGGY BACK
1 gram | Freq: Three times a day (TID) | INTRAVENOUS | Status: AC
Start: 2009-11-17 — End: 2009-11-18
  Administered 2009-11-17 – 2009-11-18 (×2): via INTRAVENOUS

## 2009-11-17 MED ORDER — LACTATED RINGERS IV
INTRAVENOUS | Status: DC
Start: 2009-11-17 — End: 2009-11-17
  Administered 2009-11-17 (×2): via INTRAVENOUS

## 2009-11-17 MED ORDER — DEXTROSE 5%-LACTATED RINGERS IV
INTRAVENOUS | Status: DC
Start: 2009-11-17 — End: 2009-11-19
  Administered 2009-11-17: via INTRAVENOUS

## 2009-11-17 MED ADMIN — ketorolac (TORADOL) injection 30 mg: INTRAVENOUS | @ 21:00:00 | NDC 64679075801

## 2009-11-17 MED ADMIN — ceFAZolin (ANCEF) 2g IVPB: INTRAVENOUS | @ 16:00:00 | NDC 99990004319

## 2009-11-17 MED ADMIN — citric acid-sodium citrate (BICITRA) 500-334 mg/5 mL solution 30 mL: ORAL | @ 16:00:00

## 2009-11-17 MED ADMIN — famotidine (PF) (PEPCID) injection 20 mg: INTRAVENOUS | @ 16:00:00 | NDC 10019004517

## 2009-11-17 MED ADMIN — metoclopramide (REGLAN) injection 10 mg: INTRAVENOUS | @ 16:00:00 | NDC 00409341401

## 2009-11-17 MED ADMIN — oxycodone (ROXICODONE) tablet 5 mg: ORAL | @ 21:00:00 | NDC 00406055262

## 2009-11-17 MED FILL — CEFAZOLIN 2 G IN 100 ML 0.9% NS: INTRAVENOUS | Qty: 100

## 2009-11-17 MED FILL — CEFAZOLIN 1 GRAM SOLUTION FOR INJECTION: 1 gram | INTRAMUSCULAR | Qty: 2000

## 2009-11-17 MED FILL — KETOROLAC TROMETHAMINE 30 MG/ML INJECTION: 30 mg/mL (1 mL) | INTRAMUSCULAR | Qty: 1

## 2009-11-17 MED FILL — SODIUM CHLORIDE 0.9 % IV PIGGY BACK: INTRAVENOUS | Qty: 50

## 2009-11-17 MED FILL — OXYTOCIN 10 UNIT/ML INJECTION: 10 unit/mL | INTRAMUSCULAR | Qty: 3

## 2009-11-17 MED FILL — LACTATED RINGERS IV: INTRAVENOUS | Qty: 1000

## 2009-11-17 MED FILL — ONDANSETRON (PF) 4 MG/2 ML INJECTION: 4 mg/2 mL | INTRAMUSCULAR | Qty: 2

## 2009-11-17 MED FILL — MARCAINE SPINAL (PF) 0.75 % (7.5 MG/ML) INJECTION SOLUTION: 0.75 % (7.5 mg/mL) | INTRAMUSCULAR | Qty: 2

## 2009-11-17 MED FILL — FAMOTIDINE (PF) 20 MG/2 ML IV: 20 mg/2 mL | INTRAVENOUS | Qty: 2

## 2009-11-17 MED FILL — OXYCODONE 5 MG TAB: 5 mg | ORAL | Qty: 1

## 2009-11-17 MED FILL — FENTANYL CITRATE (PF) 50 MCG/ML IJ SOLN: 50 mcg/mL | INTRAMUSCULAR | Qty: 2

## 2009-11-17 MED FILL — METOCLOPRAMIDE 5 MG/ML IJ SOLN: 5 mg/mL | INTRAMUSCULAR | Qty: 2

## 2009-11-17 MED FILL — SODIUM CITRATE-CITRIC ACID 500 MG-334 MG/5 ML ORAL SOLN: 500-334 mg/5 mL | ORAL | Qty: 30

## 2009-11-17 MED FILL — MORPHINE (PF) 0.5 MG/ML IJ SOLN: 0.5 mg/mL | INTRAMUSCULAR | Qty: 10

## 2009-11-17 NOTE — Progress Notes (Signed)
Report received from Hurley Cisco RN. Plan of care discussed. Care assumed. Pt resting in bed. Denies pain at present.

## 2009-11-17 NOTE — Progress Notes (Signed)
Admission assessment completed as noted, patient denies needs at this time

## 2009-11-17 NOTE — Progress Notes (Signed)
SBAR OUT Report: Mother    Verbal report given to Hurley Cisco RN  (full name & credentials) on this patient, who is now being transferred to MIU (unit) for routine progression of care.   The patient is wearing a green "Anesthesia-Duramorph" band.    Report consisted of patient's Situation, Background, Assessment and Recommendations (SBAR).       Newborn ID bands were compared with the identification form, and verified with the patient and receiving nurse.    Information from the SBAR and the [facility] Report was reviewed with the receiving nurse; opportunity for questions and clarification provided.

## 2009-11-17 NOTE — Progress Notes (Signed)
Pt admitted to 424 for scheduled repeat c/s.  Consents witnessed, IV started on 2nd attempt by B. Buikema RN, blood drawn and sent to lab. LR infusing without difficulty.

## 2009-11-17 NOTE — H&P (Signed)
Obstetrical History and Physical Note    Patient is 29 y.o. female [phone] admitted for and Bilateral Tubal Sterilization.     Since admission patient has had IV started and consent obtained.    IUP at 39 1/7 weeks, Prenatal Records were reviewed and no changes were noted.    Hx of previous c-section x 2, admitted for elective repeat c-section and bilateral tubal sterilization.    P/A:  Uterine size is 39 weeks of gestation, positive fetal heart tones 120-160, reactive fetal tracing, uterine contractions are irregular.    Cervical Exam: deferred    Assessment and Plan: As above    Permanent nature of sterilization was explained and emphasized. Failure rate and alternatives were discussed with patient.    Patient to be taken back for c-section.    Signed By: Kaleen Odea, MD     Nov 17, 2009

## 2009-11-17 NOTE — Progress Notes (Signed)
Verbally verified allergies , instructed patient on pain management, administered toradol and roxicodone

## 2009-11-17 NOTE — Progress Notes (Signed)
PRIOR TO SPINAL FHR 152. AFTER SPINAL PLACEMENT FHR 120

## 2009-11-17 NOTE — Progress Notes (Signed)
Vitals completed as noted, fundus firm, small amount of rubra, pad changed,patient eating, 3 apple juices taken to room, patient denies any other needs

## 2009-11-17 NOTE — Progress Notes (Signed)
Vitals completed as noted, fundus firm, pad changed, small amount of rubra, patient denies needs at this time

## 2009-11-17 NOTE — Progress Notes (Signed)
SBAR IN Report: Mother    Verbal report received from Serena Croissant Rn  on this patient, who is now being transferred from L&D  for routine progression of care.  The patient is wearing a green "Anesthesia-Duramorph" band.    Report consisted of patient's Situation, Background, Assessment and Recommendations (SBAR).       Newborn ID bands were compared with the identification form, and verified with the patient and transferring nurse.    Information from the SBAR and the [facility] Report was reviewed with the transferring nurse; opportunity for questions and clarification provided.

## 2009-11-18 LAB — CBC WITH AUTOMATED DIFF
ABS. BASOPHILS: 0 10*3/uL (ref 0.0–0.2)
ABS. EOSINOPHILS: 0 10*3/uL (ref 0.0–0.8)
ABS. IMM. GRANS.: 0 10*3/uL (ref 0.0–2.0)
ABS. LYMPHOCYTES: 1.3 10*3/uL (ref 0.5–4.6)
ABS. MONOCYTES: 0.7 10*3/uL (ref 0.1–1.3)
ABS. NEUTROPHILS: 5.8 10*3/uL (ref 1.7–8.2)
BASOPHILS: 0 % (ref 0.0–2.0)
EOSINOPHILS: 1 % (ref 0.5–7.8)
HCT: 30.6 % — ABNORMAL LOW (ref 37.6–48.3)
HGB: 10.1 g/dL — ABNORMAL LOW (ref 11.7–15.0)
IMMATURE GRANULOCYTES: 0.4 % (ref 0.0–2.0)
LYMPHOCYTES: 16 % (ref 13–44)
MCH: 30.9 PG (ref 26.1–32.9)
MCHC: 33 g/dL (ref 31.4–35.0)
MCV: 93.6 FL (ref 79.6–97.8)
MONOCYTES: 8 % (ref 4.0–12.0)
MPV: 10.3 FL — ABNORMAL LOW (ref 10.8–14.1)
NEUTROPHILS: 75 % (ref 43–78)
PLATELET: 151 10*3/uL (ref 140–440)
RBC: 3.27 M/uL — ABNORMAL LOW (ref 3.86–5.18)
RDW: 14 % (ref 11.9–14.6)
WBC: 7.8 10*3/uL (ref 4.0–10.5)

## 2009-11-18 MED ADMIN — ketorolac (TORADOL) injection 30 mg: INTRAVENOUS | @ 14:00:00 | NDC 00409379501

## 2009-11-18 MED ADMIN — oxycodone (ROXICODONE) tablet 5 mg: ORAL | @ 18:00:00 | NDC 00406055262

## 2009-11-18 MED ADMIN — oxycodone (ROXICODONE) tablet 5 mg: ORAL | @ 08:00:00 | NDC 00406055262

## 2009-11-18 MED ADMIN — oxycodone (ROXICODONE) tablet 5 mg: ORAL | @ 14:00:00 | NDC 00406055262

## 2009-11-18 MED ADMIN — ketorolac (TORADOL) injection 30 mg: INTRAVENOUS | @ 08:00:00 | NDC 00409379501

## 2009-11-18 MED ADMIN — calcium carbonate (TITRALAC) chewable tablet 420 mg: ORAL | @ 20:00:00 | NDC 21200076591

## 2009-11-18 MED ADMIN — ibuprofen (MOTRIN) tablet 400 mg: ORAL | @ 20:00:00 | NDC 62584074611

## 2009-11-18 MED ADMIN — hydrocodone-acetaminophen (LORTAB) 7.5-500 mg per tablet 1 Tab: ORAL | @ 22:00:00 | NDC 00406035862

## 2009-11-18 MED FILL — OXYCODONE 5 MG TAB: 5 mg | ORAL | Qty: 1

## 2009-11-18 MED FILL — KETOROLAC TROMETHAMINE 30 MG/ML INJECTION: 30 mg/mL (1 mL) | INTRAMUSCULAR | Qty: 1

## 2009-11-18 MED FILL — EPHEDRINE SULFATE 50 MG/ML IJ SOLN: 50 mg/mL | INTRAMUSCULAR | Qty: 1

## 2009-11-18 MED FILL — HYDROCODONE-ACETAMINOPHEN 7.5 MG-500 MG TAB: ORAL | Qty: 1

## 2009-11-18 MED FILL — IBUPROFEN 400 MG TAB: 400 mg | ORAL | Qty: 1

## 2009-11-18 MED FILL — SODIUM CHLORIDE 0.9 % IV PIGGY BACK: INTRAVENOUS | Qty: 50

## 2009-11-18 MED FILL — TITRALAC 168 MG CALCIUM (420 MG) CHEWABLE TABLET: 168 mg calcium (420 mg) | ORAL | Qty: 1

## 2009-11-18 NOTE — Progress Notes (Signed)
IV catheter removed with tip intact. Pt tolerated well.

## 2009-11-18 NOTE — Progress Notes (Signed)
Post-Operative Day Number 1 Progress Note    S: Patient doing well post-op day 50from cesarean delivery without significant complaints.  Pain controlled on current medication.  No Nausea / No Vomiting / No diarrheas / No fever with chills / No Shortness of breath / No chest pain. Voiding without difficulty, normal lochia.    O: Vitals:  Patient Vitals in the past 8 hrs:   BP 116/58   Pulse 87   Temp 98.4 ??F (36.9 ??C)   Resp 18   Ht 6' (1.829 m)   Wt 206 lb (93.441 kg)   BMI 27.94 kg/m2   SpO2 99%     Vital signs stable, afebrile.     Exam:  HEENT: WNL, Patient without distress.    Breast not engorged.              Abdomen soft, fundus firm at level of umbilicus, nontender.  Incision dry and                      clean without erythema.              Lower extremities are negative for swelling, cords or tenderness.     Labs:   Recent Results (from the past 24 hour(s))   CBC WITH AUTOMATED DIFF    Collection Time    11/18/09  6:47 AM   Component Value Range   ??? WBC 7.8  4.0 - 10.5 (K/uL)   ??? RBC 3.27 (*) 3.86 - 5.18 (M/uL)   ??? HGB 10.1 (*) 11.7 - 15.0 (g/dL)   ??? HCT 30.6 (*) 37.6 - 48.3 (%)   ??? MCV 93.6  79.6 - 97.8 (FL)   ??? MCH 30.9  26.1 - 32.9 (PG)   ??? MCHC 33.0  31.4 - 35.0 (g/dL)   ??? RDW 14.0  11.9 - 14.6 (%)   ??? PLATELET 151  140 - 440 (K/uL)   ??? MPV 10.3 (*) 10.8 - 14.1 (FL)   ??? DF AUTOMATED     ??? NEUTROPHILS 75  43 - 78 (%)   ??? LYMPHOCYTES 16  13 - 44 (%)   ??? MONOCYTES 8  4.0 - 12.0 (%)   ??? EOSINOPHILS 1  0.5 - 7.8 (%)   ??? BASOPHILS 0  0.0 - 2.0 (%)   ??? ABSOLUTE NEUTS 5.8  1.7 - 8.2 (K/UL)   ??? ABSOLUTE LYMPHS 1.3  0.5 - 4.6 (K/UL)   ??? ABSOLUTE MONOS 0.7  0.1 - 1.3 (K/UL)   ??? ABSOLUTE EOSINS 0.0  0.0 - 0.8 (K/UL)   ??? ABSOLUTE BASOS 0.0  0.0 - 0.2 (K/UL)   ??? IMM. GRANS. 0.4  0.0 - 2.0 (%)   ??? ABS. IMM. GRANS. 0.0  0.0 - 2.0 (K/UL)            Assessment and Plan:  As above. D/C Foley, Switch to PO pain meds, OOB with assistance.Patient appears to be having uncomplicated post-cesarean course.  Continue routine post-op care and maternal education.

## 2009-11-18 NOTE — Progress Notes (Deleted)
Nest cleaned.  Report given to Cheryl Bogatay RN

## 2009-11-18 NOTE — Progress Notes (Signed)
SCD hose removed.  IV to INT/ hep well.  Foley removed. 700 cc pink tinged urine in bag. Pt has stent in Kidney due to kidney stones. Dressing to abdomen removed. Old drainage noted on dressing.  No fresh bleeding noted. Pt up to bathroom with assistance. Linens changed. Peri care per pt. Pt back to bed with assistance.

## 2009-11-18 NOTE — Progress Notes (Signed)
Pt sleeping.  Respirations even and unlabored.  No signs of pain or distress.

## 2009-11-18 NOTE — Progress Notes (Signed)
Report recieved from Karie Chimera, RN

## 2009-11-18 NOTE — Progress Notes (Signed)
Bedside report received from Rebekah Worley RN. Care of pt assumed.

## 2009-11-18 NOTE — Progress Notes (Signed)
IVs removed from connect care.  No IVs present on assessment

## 2009-11-18 NOTE — Progress Notes (Signed)
Dr Samuella Cota made aware that pt states that she feels "feverish".

## 2009-11-18 NOTE — Progress Notes (Signed)
Pt status, temp and blood count reviewed with Dr Samuella Cota. No new orders received.

## 2009-11-18 NOTE — Progress Notes (Signed)
Patient is POD 1 s/p C-section and received neuraxial morphine for post-op pain control. She reports mild incisional pain.  Patient reports moderate pruritis.  Her lower extremities have returned to baseline neurologically.  She was satisfied with the anesthetic and reports no complications.  Continue current orders.  Will sign off case for now.  May start conventional pain treatment as per surgeon.

## 2009-11-18 NOTE — Progress Notes (Signed)
Pt requesting pain medication. Pt given 1 roxicodone and Toradol 30 mg IV.  Pain level 8/10.

## 2009-11-19 MED ADMIN — simethicone (MYLICON) tablet 80 mg: ORAL | @ 12:00:00 | NDC 63739022510

## 2009-11-19 MED ADMIN — ibuprofen (MOTRIN) tablet 400 mg: ORAL | @ 12:00:00 | NDC 62584074611

## 2009-11-19 MED ADMIN — ibuprofen (MOTRIN) tablet 400 mg: ORAL | @ 05:00:00 | NDC 62584074611

## 2009-11-19 MED ADMIN — ibuprofen (MOTRIN) tablet 400 mg: ORAL | @ 16:00:00 | NDC 62584074611

## 2009-11-19 MED ADMIN — ibuprofen (MOTRIN) tablet 400 mg: ORAL | @ 08:00:00 | NDC 62584074611

## 2009-11-19 MED ADMIN — hydrocodone-acetaminophen (LORTAB) 7.5-500 mg per tablet 1 Tab: ORAL | @ 12:00:00 | NDC 00406035862

## 2009-11-19 MED ADMIN — hydrocodone-acetaminophen (LORTAB) 7.5-500 mg per tablet 1 Tab: ORAL | @ 08:00:00 | NDC 00406035862

## 2009-11-19 MED ADMIN — ibuprofen (MOTRIN) tablet 400 mg: ORAL | NDC 62584074611

## 2009-11-19 MED ADMIN — Diph,Pertuss(Acell),Tet Vac-PF (ADACEL) susp 0.5 mL: INTRAMUSCULAR | @ 16:00:00 | NDC 49281040010

## 2009-11-19 MED ADMIN — hydrocodone-acetaminophen (LORTAB) 7.5-500 mg per tablet 1 Tab: ORAL | @ 16:00:00 | NDC 00406035862

## 2009-11-19 MED FILL — IBUPROFEN 400 MG TAB: 400 mg | ORAL | Qty: 1

## 2009-11-19 MED FILL — HYDROCODONE-ACETAMINOPHEN 7.5 MG-500 MG TAB: ORAL | Qty: 1

## 2009-11-19 MED FILL — ADACEL (TDAP ADOLESN/ADULT)(PF)2LF-(2.5-5-3-5MCG)-5 LF/0.5 ML IM SUSP: 2 Lf-(.5-5-3-5 mcg)-5Lf/0.5 mL | INTRAMUSCULAR | Qty: 0.5

## 2009-11-19 MED FILL — SIMETHICONE 80 MG CHEWABLE TAB: 80 mg | ORAL | Qty: 1

## 2009-11-19 NOTE — Progress Notes (Signed)
Patient refused massage therapy service, stated "too sore to touch wants to go home". Encouraged  Pt. To call as needs arise. Tish Frederickson, LMT

## 2009-11-19 NOTE — Progress Notes (Signed)
Discharge teaching discussed with pt. Verbalizes understanding of all teaching. Questions answered and prescriptions given to pt.

## 2009-11-19 NOTE — Progress Notes (Signed)
Pt discharged to home in good condition. Ambulated to car without difficulty.

## 2009-11-19 NOTE — Progress Notes (Signed)
Post-Operative Day Number 2 Progress Note    Patient doing well post-op day 2 from cesarean delivery without significant complaints.  Pain controlled on current medication.  Voiding without difficulty, normal lochia.    Vitals:  Patient Vitals in the past 8 hrs:   BP Temp Pulse Resp   11/19/09 0805 110/63 mmHg 98.1 ??F (36.7 ??C) 94  16        Temp (24hrs), Avg:99.1 ??F (37.3 ??C), Min:98.1 ??F (36.7 ??C), Max:99.9 ??F (37.7 ??C)      Vital signs stable, afebrile.    Exam:  Patient without distress.               Abdomen soft, fundus firm at level of umbilicus, nontender.                Incision dry and clean without erythema.               Lower extremities are negative for swelling, cords or tenderness.    Labs: No results found for this or any previous visit (from the past 24 hour(s)).    Assessment and Plan:  Patient appears to be having uncomplicated post-cesarean course.  Continue routine post-op care and maternal education.

## 2009-11-19 NOTE — Progress Notes (Signed)
Bedside report received from Joaquin Courts RN. Care of pt assumed.

## 2010-01-02 MED ORDER — DOXYCYCLINE 100 MG CAP
100 mg | ORAL_CAPSULE | Freq: Two times a day (BID) | ORAL | Status: AC
Start: 2010-01-02 — End: 2010-01-12

## 2010-01-02 MED ORDER — PREDNISONE 20 MG TAB
20 mg | ORAL_TABLET | Freq: Every day | ORAL | Status: AC
Start: 2010-01-02 — End: 2010-01-09

## 2010-01-02 MED ORDER — TRAMADOL 50 MG TAB
50 mg | ORAL_TABLET | Freq: Four times a day (QID) | ORAL | Status: AC | PRN
Start: 2010-01-02 — End: 2010-01-07

## 2010-01-02 NOTE — ED Notes (Signed)
I have reviewed discharge instructions with the patient.  The patient verbalized understanding. The patient with no questions or concerns. Patient given a prescription for a medication with potential sedative effects.  Patient advised not to drink or drive while taking this medication.

## 2010-01-02 NOTE — ED Provider Notes (Signed)
Patient is a 29 y.o. female presenting with sore throat. The history is provided by the patient.   Sore Throat   This is a new problem. The current episode started more than 2 days ago. The problem has been gradually worsening. There has been no fever. Associated symptoms include ear pain and cough. She has had no exposure to strep. Treatments tried: sinus meds. The treatment provided mild relief.        Past Medical History   Diagnosis Date   ??? Postpartum fever 05/09/2008   ??? Anemia of pregnancy 05/09/2008   ??? Previous cesarean delivery, delivered 05/09/2008   ??? Other ill-defined conditions      kidney stones   ??? General conditions of the kidney and ureter      kidney stones   ??? General conditions of the kidney and ureter      hydronephrosis   ??? Chronic kidney disease      kidney stones, stent in ureter currently.   ??? GERD (gastroesophageal reflux disease)      mild          Past Surgical History   Procedure Date   ??? Hx urological      lithotripsy   ??? Hx heent      tonsils   ??? Hx cesarean section      x3   ??? Cesarean delivery only 2001, 2006, 2009   ??? Hx other surgical 201, 2006     lithotripsy x2   ??? Hx other surgical 10/11/09     kidney-stent   ??? Hx other surgical      c-section           Family History   Problem Relation Age of Onset   ??? Alcohol abuse Neg Hx    ??? Arthritis-rheumatoid Neg Hx    ??? Asthma Neg Hx    ??? Bleeding Prob Neg Hx    ??? Diabetes Neg Hx    ??? Heart Disease Neg Hx    ??? Migraines Neg Hx    ??? Psychiatric Disorder Neg Hx    ??? Stroke Neg Hx    ??? Elevated Lipids Neg Hx    ??? Headache Neg Hx    ??? Hypertension Neg Hx    ??? Lung Disease Neg Hx    ??? Mental Retardation Neg Hx    ??? Cancer Maternal Aunt           History   Social History   ??? Marital Status: Single     Spouse Name: N/A     Number of Children: N/A   ??? Years of Education: N/A   Occupational History   ??? Not on file.   Social History Main Topics   ??? Smoking status: Former Smoker -- 0.2 packs/day   ??? Smokeless tobacco: Never Used    ??? Alcohol Use: No   ??? Drug Use: No   ??? Sexually Active: No   Other Topics Concern   ??? Not on file   Social History Narrative   ??? No narrative on file                    ALLERGIES: Meperidine      Review of Systems   HENT: Positive for ear pain and sore throat.    Respiratory: Positive for cough.    All other systems reviewed and are negative.        Filed Vitals:    01/02/10 1639 01/02/10 1641  BP: 133/73    Pulse: 63    Temp: 98.7 ??F (37.1 ??C)    Resp: 16    Height:  6' (1.829 m)   Weight:  206 lb (93.441 kg)   SpO2: 98%               Physical Exam   Nursing note and vitals reviewed.  Constitutional: She is oriented to person, place, and time. She appears well-developed and well-nourished. No distress.   HENT:   Head: Normocephalic and atraumatic.   Right Ear: External ear normal.   Left Ear: External ear normal.   Mouth/Throat: Oropharynx is clear and moist.        Tonsils removed    Eyes: Conjunctivae and EOM are normal. Pupils are equal, round, and reactive to light.   Neck: Normal range of motion. Neck supple.   Cardiovascular: Normal rate and regular rhythm.    Pulmonary/Chest: Effort normal and breath sounds normal. No respiratory distress. She has no wheezes.   Abdominal: Soft. Bowel sounds are normal.   Musculoskeletal: Normal range of motion. She exhibits no edema.   Neurological: She is alert and oriented to person, place, and time.   Skin: Skin is warm.        MDM     Differential Diagnosis; Clinical Impression; Plan:     Sinusitis   Amount and/or Complexity of Data Reviewed:    Review and summarize past medical records:  Yes  Risk of Significant Complications, Morbidity, and/or Mortality:   Presenting problems:  Low  Diagnostic procedures:  Low  Management options:  Low  Progress:   Patient progress:  Improved and stable      Procedures

## 2010-01-23 LAB — CBC WITH AUTOMATED DIFF
ABS. BASOPHILS: 0 10*3/uL (ref 0.0–0.2)
ABS. EOSINOPHILS: 0 10*3/uL (ref 0.0–0.8)
ABS. IMM. GRANS.: 0 10*3/uL (ref 0.0–2.0)
ABS. LYMPHOCYTES: 1.1 10*3/uL (ref 0.5–4.6)
ABS. MONOCYTES: 0.2 10*3/uL (ref 0.1–1.3)
ABS. NEUTROPHILS: 3 10*3/uL (ref 1.7–8.2)
BASOPHILS: 0 % (ref 0.0–2.0)
EOSINOPHILS: 0 % — ABNORMAL LOW (ref 0.5–7.8)
HCT: 38.8 % (ref 35.8–46.3)
HGB: 12.7 g/dL (ref 11.7–15.4)
IMMATURE GRANULOCYTES: 0.2 % (ref 0.0–2.0)
LYMPHOCYTES: 25 % (ref 13–44)
MCH: 30.3 PG (ref 26.1–32.9)
MCHC: 32.7 g/dL (ref 31.4–35.0)
MCV: 92.6 FL (ref 79.6–97.8)
MONOCYTES: 4 % (ref 4.0–12.0)
MPV: 10.4 FL — ABNORMAL LOW (ref 10.8–14.1)
NEUTROPHILS: 71 % (ref 43–78)
PLATELET: 247 10*3/uL (ref 150–450)
RBC: 4.19 M/uL (ref 4.05–5.25)
RDW: 14.9 % — ABNORMAL HIGH (ref 11.9–14.6)
WBC: 4.2 10*3/uL — ABNORMAL LOW (ref 4.3–11.1)

## 2010-01-23 LAB — DRUG SCREEN, URINE
ACETAMINOPHEN: NEGATIVE
AMPHETAMINES: NEGATIVE
BARBITURATES: NEGATIVE
BENZODIAZEPINES: NEGATIVE
COCAINE: NEGATIVE
METHADONE: NEGATIVE
Methamphetamines: NEGATIVE
OPIATES: NEGATIVE
PCP(PHENCYCLIDINE): POSITIVE
THC (TH-CANNABINOL): POSITIVE
TRICYCLICS: NEGATIVE

## 2010-01-23 LAB — URINALYSIS W/ REFLEX CULTURE
Bilirubin: NEGATIVE
Casts: 0 /LPF
Crystals, urine: 0 /LPF
Epithelial cells: 0 /HPF
Glucose: NEGATIVE MG/DL
Ketone: NEGATIVE MG/DL
Mucus: 0 /LPF
Nitrites: NEGATIVE
Protein: NEGATIVE MG/DL
Specific gravity: 1.015 (ref 1.001–1.023)
Urobilinogen: 0.2 EU/DL (ref 0.2–1.0)
pH (UA): 7 (ref 5.0–9.0)

## 2010-01-23 LAB — METABOLIC PANEL, COMPREHENSIVE
A-G Ratio: 1.1 — ABNORMAL LOW (ref 1.2–3.5)
ALT (SGPT): 31 U/L — ABNORMAL LOW (ref 39–65)
AST (SGOT): 16 U/L (ref 15–37)
Albumin: 4.1 g/dL (ref 3.5–5.0)
Alk. phosphatase: 73 U/L (ref 50–136)
Anion gap: 9 mmol/L (ref 7–16)
BUN: 6 MG/DL (ref 6–23)
Bilirubin, total: 0.6 MG/DL (ref 0.2–1.1)
CO2: 26 MMOL/L (ref 23–32)
Calcium: 9.3 MG/DL (ref 8.3–10.4)
Chloride: 105 MMOL/L (ref 98–107)
Creatinine: 1.2 MG/DL (ref 0.6–1.5)
GFR est AA: >60 mL/min/{1.73_m2} (ref >60)
GFR est non-AA: 56 mL/min/{1.73_m2} — ABNORMAL LOW (ref >60)
Globulin: 3.7 g/dL — ABNORMAL HIGH (ref 2.3–3.5)
Glucose: 152 MG/DL — ABNORMAL HIGH (ref 65–100)
Potassium: 3.5 MMOL/L (ref 3.5–5.1)
Protein, total: 7.8 g/dL (ref 6.3–8.2)
Sodium: 140 MMOL/L (ref 136–145)

## 2010-01-23 LAB — HCG URINE, QL. - POC: Pregnancy test,urine (POC): NEGATIVE

## 2010-01-23 MED ORDER — KETOROLAC TROMETHAMINE 10 MG TAB
10 mg | ORAL_TABLET | Freq: Three times a day (TID) | ORAL | Status: AC
Start: 2010-01-23 — End: 2010-01-28

## 2010-01-23 MED ORDER — HYDROCODONE-ACETAMINOPHEN 5 MG-500 MG TAB
5-500 mg | ORAL_TABLET | ORAL | Status: DC | PRN
Start: 2010-01-23 — End: 2010-03-03

## 2010-01-23 MED ORDER — PROMETHAZINE 25 MG TAB
25 mg | ORAL_TABLET | Freq: Four times a day (QID) | ORAL | Status: AC | PRN
Start: 2010-01-23 — End: 2010-01-30

## 2010-01-23 MED ADMIN — sodium chloride 0.9 % bolus infusion 1,000 mL: INTRAVENOUS | @ 10:00:00 | NDC 00409798309

## 2010-01-23 MED ADMIN — HYDROmorphone (PF) (DILAUDID) injection 1 mg: INTRAVENOUS | @ 10:00:00 | NDC 00409255201

## 2010-01-23 MED ADMIN — promethazine (PHENERGAN) injection 25 mg: INTRAVENOUS | @ 10:00:00 | NDC 00641092821

## 2010-01-23 MED ADMIN — ketorolac (TORADOL) injection 30 mg: INTRAVENOUS | @ 10:00:00 | NDC 00409379501

## 2010-01-23 MED FILL — HYDROMORPHONE (PF) 1 MG/ML IJ SOLN: 1 mg/mL | INTRAMUSCULAR | Qty: 1

## 2010-01-23 MED FILL — KETOROLAC TROMETHAMINE 30 MG/ML INJECTION: 30 mg/mL (1 mL) | INTRAMUSCULAR | Qty: 1

## 2010-01-23 MED FILL — PROMETHAZINE 25 MG/ML INJECTION: 25 mg/mL | INTRAMUSCULAR | Qty: 1

## 2010-01-23 NOTE — ED Notes (Signed)
Pt sts contacted someone for ride home.  not given time frame for arrival.

## 2010-01-23 NOTE — ED Notes (Signed)
Pt very uncooperative and thrashing in bed. Pt moaning and very limited in providing verbal information. Pt c/o L flank pain and difficulty passing urine. Pt unable to urinate in ER. Foley placed and 50 ml of yellow urine extracted. Pt was supposed to have a stone removed, but did not follow up for procedure.

## 2010-01-23 NOTE — ED Notes (Signed)
Report received from [place], RN Arouses to verbal stimuli. resp equal and unlabored. No noted distress. Pt given phone to call for transport home.

## 2010-01-23 NOTE — ED Notes (Signed)
Pt in paper scrubs and own personal bathrobe, black backpack/purse with pt. Rx x 3 and discharge papers given, follow up with urology reemphasized. Pt verbalized understanding, denies questions.  Pt assisted out to lobby in wheelchair. Talking on personal cell phone.

## 2010-01-23 NOTE — ED Notes (Signed)
Ct urogram cancelled after consulting MD, pt has known kidney stone and was to follow up with urology, pt did not follow up. Emphasized to pt importance of following up as directed.

## 2010-01-23 NOTE — ED Notes (Signed)
PT NOT COOPERATIVE UNABLE TO OBTAIN DRUG LIST MEDS

## 2010-01-23 NOTE — ED Notes (Signed)
After pt medicated pt immediately stopped thrashing and moaning. Pt states she is more comfortable.

## 2010-01-23 NOTE — ED Notes (Signed)
Attempted to call for a ride home (called both numbers listed on the profile number changed/not available) no success

## 2010-01-23 NOTE — ED Notes (Signed)
C/O LEFT FLANK PAIN. HX KIDNEY STONES

## 2010-01-23 NOTE — ED Provider Notes (Signed)
Patient is a 29 y.o. female presenting with flank pain. History provided by: pt co l flank pain similar to past kidney stones.   Flank Pain          Past Medical History   Diagnosis Date   ??? Postpartum fever 05/09/2008   ??? Anemia of pregnancy 05/09/2008   ??? Previous cesarean delivery, delivered 05/09/2008   ??? Other ill-defined conditions      kidney stones   ??? General conditions of the kidney and ureter      kidney stones   ??? General conditions of the kidney and ureter      hydronephrosis   ??? Chronic kidney disease      kidney stones, stent in ureter currently.   ??? GERD (gastroesophageal reflux disease)      mild          Past Surgical History   Procedure Date   ??? Hx urological      lithotripsy   ??? Hx heent      tonsils   ??? Hx cesarean section      x3   ??? Cesarean delivery only 2001, 2006, 2009   ??? Hx other surgical 201, 2006     lithotripsy x2   ??? Hx other surgical 10/11/09     kidney-stent   ??? Hx other surgical      c-section           Family History   Problem Relation Age of Onset   ??? Alcohol abuse Neg Hx    ??? Arthritis-rheumatoid Neg Hx    ??? Asthma Neg Hx    ??? Bleeding Prob Neg Hx    ??? Diabetes Neg Hx    ??? Heart Disease Neg Hx    ??? Migraines Neg Hx    ??? Psychiatric Disorder Neg Hx    ??? Stroke Neg Hx    ??? Elevated Lipids Neg Hx    ??? Headache Neg Hx    ??? Hypertension Neg Hx    ??? Lung Disease Neg Hx    ??? Mental Retardation Neg Hx    ??? Cancer Maternal Aunt           History   Social History   ??? Marital Status: Single     Spouse Name: N/A     Number of Children: N/A   ??? Years of Education: N/A   Occupational History   ??? Not on file.   Social History Main Topics   ??? Smoking status: Former Smoker -- 0.2 packs/day   ??? Smokeless tobacco: Never Used   ??? Alcohol Use: No   ??? Drug Use: No   ??? Sexually Active: No   Other Topics Concern   ??? Not on file   Social History Narrative   ??? No narrative on file                    ALLERGIES: Meperidine      Review of Systems   Genitourinary: Positive for flank pain.    All other systems reviewed and are negative.        Filed Vitals:    01/23/10 0517   BP: 154/91   Pulse: 64   Temp: 97.1 ??F (36.2 ??C)   Resp: 18   Height: 5\' 8"  (1.727 m)   Weight: 150 lb (68.04 kg)   SpO2: 100%              Physical Exam   Constitutional: She  is oriented to person, place, and time. She appears well-developed and well-nourished. No distress.   HENT:   Head: Normocephalic and atraumatic.   Right Ear: External ear normal.   Left Ear: External ear normal.   Nose: Nose normal.   Mouth/Throat: Oropharynx is clear and moist. No oropharyngeal exudate.   Eyes: Conjunctivae and EOM are normal. Pupils are equal, round, and reactive to light. Right eye exhibits no discharge. Left eye exhibits no discharge. No scleral icterus.   Neck: Normal range of motion. Neck supple. No JVD present. No tracheal deviation present. No thyromegaly present.   Cardiovascular: Normal rate, regular rhythm, normal heart sounds and intact distal pulses.  Exam reveals no gallop and no friction rub.    No murmur heard.  Pulmonary/Chest: Effort normal and breath sounds normal. No stridor. No respiratory distress. She has no wheezes. She has no rales. She exhibits no tenderness.   Abdominal: Soft. Bowel sounds are normal. She exhibits no distension and no mass. No tenderness. She has no rebound and no guarding.   Musculoskeletal: Normal range of motion. She exhibits no edema and no tenderness.   Lymphadenopathy:     She has no cervical adenopathy.   Neurological: She is alert and oriented to person, place, and time. She has normal reflexes. No cranial nerve deficit. She exhibits normal muscle tone. Coordination normal.   Skin: Skin is warm and dry. No rash noted. She is not diaphoretic. No erythema. No pallor.   Psychiatric: She has a normal mood and affect. Her behavior is normal. Judgment and thought content normal.        MDM    Procedures

## 2010-01-23 NOTE — ED Notes (Signed)
Pt sts had no clothes on arrival, paper scrubs given. Pt changing.

## 2010-01-23 NOTE — ED Notes (Deleted)
Dc home with family instructions given verbalized and understood

## 2010-01-25 LAB — CULTURE, URINE
Culture result:: NO GROWTH
Culture: NO GROWTH

## 2010-03-03 MED ORDER — PENICILLIN V-K 500 MG TAB
500 mg | ORAL_TABLET | Freq: Four times a day (QID) | ORAL | Status: AC
Start: 2010-03-03 — End: 2010-03-10

## 2010-03-03 NOTE — ED Provider Notes (Signed)
HPI Comments: Denise Turner is a 29 y.o. African American female who c/o sore throat, fever and bodyaches  Since yesterday. Worse today. No cough or congestion. Hurts to swallow. Ears hurt too. Has 4 small children.  No n/v/d. Missed work and needs note too.          Patient is a 29 y.o. female presenting with sore throat. The history is provided by the patient.   Sore Throat          Past Medical History   Diagnosis Date   ??? Postpartum fever 05/09/2008   ??? Anemia of pregnancy 05/09/2008   ??? Previous cesarean delivery, delivered 05/09/2008   ??? Other ill-defined conditions      kidney stones   ??? General conditions of the kidney and ureter      kidney stones   ??? General conditions of the kidney and ureter      hydronephrosis   ??? Chronic kidney disease      kidney stones, stent in ureter currently.   ??? GERD (gastroesophageal reflux disease)      mild          Past Surgical History   Procedure Date   ??? Hx urological      lithotripsy   ??? Hx heent      tonsils   ??? Hx cesarean section      x3   ??? Cesarean delivery only 2001, 2006, 2009   ??? Hx other surgical 201, 2006     lithotripsy x2   ??? Hx other surgical 10/11/09     kidney-stent   ??? Hx other surgical      c-section           Family History   Problem Relation Age of Onset   ??? Alcohol abuse Neg Hx    ??? Arthritis-rheumatoid Neg Hx    ??? Asthma Neg Hx    ??? Bleeding Prob Neg Hx    ??? Diabetes Neg Hx    ??? Heart Disease Neg Hx    ??? Migraines Neg Hx    ??? Psychiatric Disorder Neg Hx    ??? Stroke Neg Hx    ??? Elevated Lipids Neg Hx    ??? Headache Neg Hx    ??? Hypertension Neg Hx    ??? Lung Disease Neg Hx    ??? Mental Retardation Neg Hx    ??? Cancer Maternal Aunt           History   Social History   ??? Marital Status: Single     Spouse Name: N/A     Number of Children: N/A   ??? Years of Education: N/A   Occupational History   ??? Not on file.   Social History Main Topics   ??? Smoking status: Former Smoker -- 0.2 packs/day   ??? Smokeless tobacco: Never Used   ??? Alcohol Use: No    ??? Drug Use: No   ??? Sexually Active: No   Other Topics Concern   ??? Not on file   Social History Narrative   ??? No narrative on file                    ALLERGIES: Meperidine      Review of Systems   HENT: Positive for sore throat.    All other systems reviewed and are negative.        Filed Vitals:    03/03/10 1819   BP: 128/74  Pulse: 72   Temp: 98 ??F (36.7 ??C)   Resp: 18   Height: 6' (1.829 m)   Weight: 160 lb (72.576 kg)   SpO2: 100%              Physical Exam   Nursing note and vitals reviewed.  Constitutional: She is oriented to person, place, and time. She appears well-developed and well-nourished. No distress.   HENT:   Head: Normocephalic and atraumatic.   Right Ear: External ear normal.   Left Ear: External ear normal.   Nose: Nose normal.        Tonsils red with some exudate. Uvula midline. No trismus or drooling   Eyes: Conjunctivae and EOM are normal.   Neck: Normal range of motion. Neck supple.   Cardiovascular: Normal rate, regular rhythm and normal heart sounds.    Pulmonary/Chest: Effort normal and breath sounds normal.   Lymphadenopathy:     She has cervical adenopathy.   Neurological: She is alert and oriented to person, place, and time.   Skin: Skin is warm and dry. She is not diaphoretic.   Psychiatric: She has a normal mood and affect. Her behavior is normal.        MDM    Procedures

## 2010-03-03 NOTE — ED Notes (Shared)
I have reviewed discharge instructions with the patient.  The patient verbalized understanding.

## 2010-03-03 NOTE — ED Provider Notes (Signed)
I was present in the department and available for consultation during this encounter.

## 2010-03-28 LAB — URINE MICROSCOPIC
Casts: 0 /LPF
Crystals, urine: 0 /LPF
RBC: >100 /HPF

## 2010-03-28 LAB — HCG URINE, QL. - POC: Pregnancy test,urine (POC): NEGATIVE

## 2010-03-28 MED ORDER — SALINE PERIPHERAL FLUSH PRN
INTRAMUSCULAR | Status: DC | PRN
Start: 2010-03-28 — End: 2010-03-28

## 2010-03-28 MED ORDER — SODIUM CHLORIDE 0.9 % IV PIGGY BACK
1 gram | INTRAVENOUS | Status: AC
Start: 2010-03-28 — End: 2010-03-28
  Administered 2010-03-29: via INTRAVENOUS

## 2010-03-28 MED ORDER — KETOROLAC TROMETHAMINE 30 MG/ML INJECTION
30 mg/mL (1 mL) | INTRAMUSCULAR | Status: AC
Start: 2010-03-28 — End: 2010-03-28
  Administered 2010-03-29: via INTRAVENOUS

## 2010-03-28 MED ORDER — ONDANSETRON (PF) 4 MG/2 ML INJECTION
4 mg/2 mL | Freq: Once | INTRAMUSCULAR | Status: AC
Start: 2010-03-28 — End: 2010-03-28
  Administered 2010-03-29: via INTRAVENOUS

## 2010-03-28 MED ORDER — SALINE PERIPHERAL FLUSH Q8H
Freq: Three times a day (TID) | INTRAMUSCULAR | Status: DC
Start: 2010-03-28 — End: 2010-03-28

## 2010-03-28 NOTE — ED Provider Notes (Signed)
Patient is a 29 y.o. female presenting with hematuria. The history is provided by the patient. No language interpreter was used.   Blood in Urine   This is a new problem. The current episode started more than 1 week ago. The problem occurs every urination. The problem has not changed since onset. The pain is at a severity of 10/10. There has been a fever of 102 - 102.9 F. She is sexually active. There is no history of pyelonephritis. Associated symptoms include flank pain and back pain. Pertinent negatives include no chills. The patient is not pregnant.She has tried nothing for the symptoms. Her past medical history is significant for kidney stones.        Past Medical History   Diagnosis Date   ??? Postpartum fever 05/09/2008   ??? Anemia of pregnancy 05/09/2008   ??? Previous cesarean delivery, delivered 05/09/2008   ??? Other ill-defined conditions      kidney stones   ??? General conditions of the kidney and ureter      kidney stones   ??? General conditions of the kidney and ureter      hydronephrosis   ??? Chronic kidney disease      kidney stones, stent in ureter currently.   ??? GERD (gastroesophageal reflux disease)      mild          Past Surgical History   Procedure Date   ??? Hx urological      lithotripsy   ??? Hx heent      tonsils   ??? Hx cesarean section      x3   ??? Cesarean delivery only 2001, 2006, 2009   ??? Hx other surgical 201, 2006     lithotripsy x2   ??? Hx other surgical 10/11/09     kidney-stent   ??? Hx other surgical      c-section           Family History   Problem Relation Age of Onset   ??? Alcohol abuse Neg Hx    ??? Arthritis-rheumatoid Neg Hx    ??? Asthma Neg Hx    ??? Bleeding Prob Neg Hx    ??? Diabetes Neg Hx    ??? Heart Disease Neg Hx    ??? Migraines Neg Hx    ??? Psychiatric Disorder Neg Hx    ??? Stroke Neg Hx    ??? Elevated Lipids Neg Hx    ??? Headache Neg Hx    ??? Hypertension Neg Hx    ??? Lung Disease Neg Hx    ??? Mental Retardation Neg Hx    ??? Cancer Maternal Aunt           History   Social History    ??? Marital Status: Single     Spouse Name: N/A     Number of Children: N/A   ??? Years of Education: N/A   Occupational History   ??? Not on file.   Social History Main Topics   ??? Smoking status: Former Smoker -- 0.2 packs/day   ??? Smokeless tobacco: Never Used   ??? Alcohol Use: No   ??? Drug Use: No   ??? Sexually Active: No   Other Topics Concern   ??? Not on file   Social History Narrative   ??? No narrative on file                    ALLERGIES: Meperidine      Review of Systems  Constitutional: Negative for fever and chills.   Genitourinary: Positive for flank pain.   Musculoskeletal: Positive for back pain.   Neurological: Negative for weakness and numbness.   All other systems reviewed and are negative.        Filed Vitals:    03/28/10 1836   BP: 118/55   Pulse: 68   Temp: 98.3 ??F (36.8 ??C)   Resp: 18   Height: 6' (1.829 m)   Weight: 159 lb (72.122 kg)   SpO2: 98%              Physical Exam   Nursing note and vitals reviewed.  Constitutional: She is oriented to person, place, and time. She appears well-developed and well-nourished.   HENT:   Head: Normocephalic and atraumatic.   Eyes: Conjunctivae are normal. Pupils are equal, round, and reactive to light.   Neck: Normal range of motion. Neck supple.   Cardiovascular: Normal rate and regular rhythm.    Pulmonary/Chest: Effort normal and breath sounds normal. No respiratory distress.   Abdominal: Soft. Bowel sounds are normal. Tenderness is present. She has no guarding.   Musculoskeletal: Normal range of motion. She exhibits no edema.   Neurological: She is alert and oriented to person, place, and time.   Skin: Skin is warm and dry. No rash noted.   Psychiatric: She has a normal mood and affect. Her behavior is normal.        MDM     Amount and/or Complexity of Data Reviewed:   Clinical lab tests:  Reviewed and ordered  Tests in the radiology section of CPT??:  Ordered and reviewed  Discussion of test results with the performing providers:  No    Decide to obtain previous medical records or to obtain history from someone other than the patient:  No   Obtain history from someone other than the patient:  No   Review and summarize past medical records:  Yes   Discuss the patient with another provider:  No   Independant visualization of image, tracing, or specimen:  Yes  Risk of Significant Complications, Morbidity, and/or Mortality:   Presenting problems:  Moderate  Diagnostic procedures:  Moderate  Management options:  Moderate  Progress:   Patient progress:  Improved      Procedures

## 2010-03-28 NOTE — ED Notes (Signed)
The patient was observed in the ED.    Results Reviewed:      Recent Results (from the past 24 hour(s))   URINE MICROSCOPIC    Collection Time    03/28/10  6:53 PM   Component Value Range   ??? WBC 20-50  0 (/HPF)   ??? RBC >100  0 (/HPF)   ??? Epithelial cells 3-5  0 (/HPF)   ??? Bacteria 3+ (*) 0 (/HPF)   ??? Casts 0  0 (/LPF)   ??? Crystals 0  0 (/LPF)   ??? Amorphous Crystals TRACE  0    ??? Mucus TRACE  0 (/LPF)   HCG URINE, QL. - POC    Collection Time    03/28/10  6:57 PM   Component Value Range   ??? Pregnancy test,urine (POC) NEGATIVE   NEGATIVE    METABOLIC PANEL, BASIC    Collection Time    03/28/10  8:05 PM   Component Value Range   ??? Sodium 142  136 - 145 (MMOL/L)   ??? Potassium 3.7  3.5 - 5.1 (MMOL/L)   ??? Chloride 107  98 - 107 (MMOL/L)   ??? CO2 29  23 - 32 (MMOL/L)   ??? Anion gap 6 (*) 7 - 16 (mmol/L)   ??? Glucose 87  65 - 100 (MG/DL)   ??? BUN 11  6 - 23 (MG/DL)   ??? Creatinine 1.0  0.6 - 1.5 (MG/DL)   ??? GFR est AA >60  >60 (ml/min/1.61m2)   ??? GFR est non-AA >60  >60 (ml/min/1.60m2)   ??? Calcium 8.8  8.3 - 10.4 (MG/DL)   CBC WITH AUTOMATED DIFF    Collection Time    03/28/10  8:05 PM   Component Value Range   ??? WBC 6.3  4.3 - 11.1 (K/uL)   ??? RBC 3.97 (*) 4.05 - 5.25 (M/uL)   ??? HGB 12.0  11.7 - 15.4 (g/dL)   ??? HCT 37.6  35.8 - 46.3 (%)   ??? MCV 94.7  79.6 - 97.8 (FL)   ??? MCH 30.2  26.1 - 32.9 (PG)   ??? MCHC 31.9  31.4 - 35.0 (g/dL)   ??? RDW 14.5  11.9 - 14.6 (%)   ??? PLATELET 238  150 - 450 (K/uL)   ??? MPV 10.0 (*) 10.8 - 14.1 (FL)   ??? DF AUTOMATED     ??? NEUTROPHILS 45  43 - 78 (%)   ??? LYMPHOCYTES 46 (*) 13 - 44 (%)   ??? MONOCYTES 6  4.0 - 12.0 (%)   ??? EOSINOPHILS 2  0.5 - 7.8 (%)   ??? BASOPHILS 1  0.0 - 2.0 (%)   ??? ABSOLUTE NEUTS 2.8  1.7 - 8.2 (K/UL)   ??? ABSOLUTE LYMPHS 2.9  0.5 - 4.6 (K/UL)   ??? ABSOLUTE MONOS 0.4  0.1 - 1.3 (K/UL)   ??? ABSOLUTE EOSINS 0.1  0.0 - 0.8 (K/UL)   ??? ABSOLUTE BASOS 0.0  0.0 - 0.2 (K/UL)   ??? IMM. GRANS. 0.2  0.0 - 2.0 (%)   ??? ABS. IMM. GRANS. 0.0  0.0 - 2.0 (K/UL)          I discussed the results of all labs, procedures, radiographs, and treatments with the patient and available family.  Treatment plan is agreed upon and the patient is ready for discharge.  All voiced understanding of the discharge plan and medication instructions or changes as appropriate.  Questions about treatment in the ED were answered.  All were encouraged to return should symptoms worsen or new problems develop.

## 2010-03-28 NOTE — ED Notes (Signed)
Report to Erin, RN.

## 2010-03-28 NOTE — ED Notes (Signed)
Pt doesn't remember when she was dx with the kidney stone, but stats that she has never passed it

## 2010-03-28 NOTE — ED Notes (Signed)
I have reviewed discharge instructions with the patient.  The patient verbalized understanding.

## 2010-03-29 LAB — METABOLIC PANEL, BASIC
Anion gap: 6 mmol/L — ABNORMAL LOW (ref 7–16)
BUN: 11 MG/DL (ref 6–23)
CO2: 29 MMOL/L (ref 23–32)
Calcium: 8.8 MG/DL (ref 8.3–10.4)
Chloride: 107 MMOL/L (ref 98–107)
Creatinine: 1 MG/DL (ref 0.6–1.5)
GFR est AA: >60 mL/min/{1.73_m2} (ref >60)
GFR est non-AA: >60 mL/min/{1.73_m2} (ref >60)
Glucose: 87 MG/DL (ref 65–100)
Potassium: 3.7 MMOL/L (ref 3.5–5.1)
Sodium: 142 MMOL/L (ref 136–145)

## 2010-03-29 LAB — CBC WITH AUTOMATED DIFF
ABS. BASOPHILS: 0 10*3/uL (ref 0.0–0.2)
ABS. EOSINOPHILS: 0.1 10*3/uL (ref 0.0–0.8)
ABS. IMM. GRANS.: 0 10*3/uL (ref 0.0–2.0)
ABS. LYMPHOCYTES: 2.9 10*3/uL (ref 0.5–4.6)
ABS. MONOCYTES: 0.4 10*3/uL (ref 0.1–1.3)
ABS. NEUTROPHILS: 2.8 10*3/uL (ref 1.7–8.2)
BASOPHILS: 1 % (ref 0.0–2.0)
EOSINOPHILS: 2 % (ref 0.5–7.8)
HCT: 37.6 % (ref 35.8–46.3)
HGB: 12 g/dL (ref 11.7–15.4)
IMMATURE GRANULOCYTES: 0.2 % (ref 0.0–2.0)
LYMPHOCYTES: 46 % — ABNORMAL HIGH (ref 13–44)
MCH: 30.2 PG (ref 26.1–32.9)
MCHC: 31.9 g/dL (ref 31.4–35.0)
MCV: 94.7 FL (ref 79.6–97.8)
MONOCYTES: 6 % (ref 4.0–12.0)
MPV: 10 FL — ABNORMAL LOW (ref 10.8–14.1)
NEUTROPHILS: 45 % (ref 43–78)
PLATELET: 238 10*3/uL (ref 150–450)
RBC: 3.97 M/uL — ABNORMAL LOW (ref 4.05–5.25)
RDW: 14.5 % (ref 11.9–14.6)
WBC: 6.3 10*3/uL (ref 4.3–11.1)

## 2010-03-29 MED ORDER — HYDROMORPHONE 2 MG TAB
2 mg | ORAL_TABLET | Freq: Four times a day (QID) | ORAL | Status: DC | PRN
Start: 2010-03-29 — End: 2010-04-12

## 2010-03-29 MED ORDER — TAMSULOSIN SR 0.4 MG 24 HR CAP
0.4 mg | ORAL_CAPSULE | Freq: Every day | ORAL | Status: AC
Start: 2010-03-29 — End: 2010-04-12

## 2010-03-29 MED ORDER — CEPHALEXIN 500 MG CAP
500 mg | ORAL_CAPSULE | Freq: Two times a day (BID) | ORAL | Status: AC
Start: 2010-03-29 — End: 2010-04-04

## 2010-04-07 NOTE — Other (Addendum)
Phone assessment done. Patient verbalize understanding of all preop instructions, aware of 4 digit code. Patient instructed on NPO past midnight, need to take shower with Hibiclens, have responsible adult to drive them to surgery, stay here all through surgery and drive patient home, stay with patient 24 hours after surgery. Patient instructed to leave money, jewelry, at home, all piercings must be removed. Given education on pain management, hand hygiene and smoking. Patient instructed to call surgeon office and [phone] if decide to cancel the surgery.  Patient answered history questions at his best knowledge.    PT. INSTRUCTED TO CALL THE FOLLOWING NUMBERS IF ANY SAFETY CONCERNS BEFORE, DURING, OR AFTER HOSPITAL ENCOUNTER: PT. SAFETY LINE - [phone] OR PT. RELATIONS - [phone].    Patient instructed to take following meds day of surgery with small sip of water: Demerol or lortab if needed.   Patient instructed to hold following meds before surgery: none.   Patient instructed to hold following meds Morning of surgery: none.   Patient states she do not take any blood thinners.       Type of surgery: 1b  Labs per surgeon: no orders from surgeon yet.   Labs per anestesia protocol: hgb due to history of anemia. Patient instructed to come for labs. No Hibiclens given due to non invasive surgery.

## 2010-04-12 LAB — HCG URINE, QL. - POC: Pregnancy test,urine (POC): NEGATIVE

## 2010-04-12 MED ORDER — OXYCODONE-ACETAMINOPHEN 10 MG-325 MG TAB
10-325 mg | ORAL | Status: DC | PRN
Start: 2010-04-12 — End: 2010-04-12
  Administered 2010-04-12: 14:00:00 via ORAL

## 2010-04-12 MED ORDER — FAMOTIDINE 20 MG TAB
20 mg | Freq: Once | ORAL | Status: AC
Start: 2010-04-12 — End: 2010-04-12
  Administered 2010-04-12: 12:00:00 via ORAL

## 2010-04-12 MED ORDER — MIDAZOLAM 1 MG/ML IJ SOLN
1 mg/mL | Freq: Once | INTRAMUSCULAR | Status: DC | PRN
Start: 2010-04-12 — End: 2010-04-12

## 2010-04-12 MED ORDER — CIPROFLOXACIN 500 MG TAB
500 mg | Freq: Once | ORAL | Status: AC
Start: 2010-04-12 — End: 2010-04-12
  Administered 2010-04-12: 12:00:00 via ORAL

## 2010-04-12 MED ORDER — NALBUPHINE 10 MG/ML INJECTION
10 mg/mL | Freq: Once | INTRAMUSCULAR | Status: DC | PRN
Start: 2010-04-12 — End: 2010-04-12

## 2010-04-12 MED ORDER — HYDROMORPHONE 2 MG TAB
2 mg | ORAL_TABLET | Freq: Four times a day (QID) | ORAL | Status: DC | PRN
Start: 2010-04-12 — End: 2010-05-12

## 2010-04-12 MED ORDER — SODIUM CHLORIDE 0.9 % IV
INTRAVENOUS | Status: DC
Start: 2010-04-12 — End: 2010-04-12

## 2010-04-12 MED ORDER — HYDROCODONE-ACETAMINOPHEN 7.5 MG-500 MG TAB
ORAL_TABLET | ORAL | Status: DC | PRN
Start: 2010-04-12 — End: 2010-05-12

## 2010-04-12 MED ORDER — OXYCODONE-ACETAMINOPHEN 5 MG-325 MG TAB
5-325 mg | ORAL | Status: DC | PRN
Start: 2010-04-12 — End: 2010-04-12

## 2010-04-12 MED ORDER — MIDAZOLAM 5 MG/ML IJ SOLN
5 mg/mL | INTRAMUSCULAR | Status: DC | PRN
Start: 2010-04-12 — End: 2010-04-12

## 2010-04-12 MED ORDER — DIPHENHYDRAMINE HCL 50 MG/ML IJ SOLN
50 mg/mL | INTRAMUSCULAR | Status: DC | PRN
Start: 2010-04-12 — End: 2010-04-12

## 2010-04-12 MED ORDER — HYDROMORPHONE (PF) 2 MG/ML IJ SOLN
2 mg/mL | INTRAMUSCULAR | Status: DC | PRN
Start: 2010-04-12 — End: 2010-04-12
  Administered 2010-04-12: 14:00:00 via INTRAVENOUS

## 2010-04-12 MED ORDER — LACTATED RINGERS IV
INTRAVENOUS | Status: DC
Start: 2010-04-12 — End: 2010-04-12
  Administered 2010-04-12: 12:00:00 via INTRAVENOUS

## 2010-04-12 MED ORDER — HYDROMORPHONE (PF) 1 MG/ML IJ SOLN
1 mg/mL | Freq: Once | INTRAMUSCULAR | Status: AC
Start: 2010-04-12 — End: 2010-04-12
  Administered 2010-04-12: 13:00:00 via INTRAVENOUS

## 2010-04-12 MED ORDER — TAMSULOSIN SR 0.4 MG 24 HR CAP
0.4 mg | ORAL_CAPSULE | Freq: Every day | ORAL | Status: AC
Start: 2010-04-12 — End: 2010-04-27

## 2010-04-12 MED ORDER — ONDANSETRON (PF) 4 MG/2 ML INJECTION
4 mg/2 mL | Freq: Once | INTRAMUSCULAR | Status: DC
Start: 2010-04-12 — End: 2010-04-12

## 2010-04-12 MED ORDER — LIDOCAINE HCL 1 % (10 MG/ML) IJ SOLN
10 mg/mL (1 %) | INTRAMUSCULAR | Status: DC | PRN
Start: 2010-04-12 — End: 2010-04-12

## 2010-04-12 NOTE — Op Note (Signed)
Op Notes signed by Rudean Hitt, MD at 04/15/10 1230                 Author: Rudean Hitt, MD  Service: --  Author Type: Physician       Filed: 04/15/10 1230  Date of Service: 04/15/10 0207  Status: Signed          Editor: Rudean Hitt, MD (Physician)          <!--EPICS-->                            [facility]<BR>                           [address]<BR>                          [place],  [place]. [address]<BR>                               [phone]<BR> <BR>                             OPERATIVE REPORT<BR> <BR> NAME:  Denise Turner, Denise Turner                        MR:  [id number]<WU> LOC:  SO                    SEX:  F               ACCT:  [id number]  DOB:  10-07-1980            AGE:  29              PT:  S<BR> ADMIT:  04/12/2010          DSCH:  04/12/2010     MSV:  SUR<BR> <BR> <BR> DATE OF PROCEDURE:  04/12/2010<BR> <BR> PREPROCEDURE DIAGNOSIS:  A 9-mm uteropelvic junction stone.<BR> <BR> POSTPROCEDURE  DIAGNOSIS:   A 9-mm uteropelvic junction stone<BR> <BR> NAME OF PROCEDURE:  Extracorporeal shockwave lithotripsy.  n<BR> <BR> SURGEON:  Marya Landry. Aarohi Redditt, MD<BR> <BR> ANESTHESIA:  General.<BR> <BR> DESCRIPTION OF PROCEDURE:  Number of shocks was 3350.   Maximal power 11.<BR> Postoperative images indicated excellent fragmentation of stone.<BR> <BR> <BR> <BR> <BR> <BR> <BR> <BR> Rudean Hitt, MD        A<BR> <BR>             This is an unverified document unless signed by physician.<BR> <BR> TID:   wmx                                      DT:  04/15/2010  2:07 A<BR> JOB:  [id number]        DOC#:  [id number]           DD:  04/12/2010<BR> <BR> cc:   Rudean Hitt, MD<BR> <!--EPICE-->

## 2010-04-12 NOTE — Other (Incomplete)
Preoperative flank skin condition: ***  Lead shield: YES   Patient ear protection:YES   Gel applied to patient's flank and Lithotripsy head- YES  :Starting power level3   Shock start time (first scout fluoroscopy): ***  Shock stop time (last lithotripsy shock): ***  Ending power level: 11  Total shocks: ***  Total fluoroscopy time: ***  Postoperative flank skin condition: ***

## 2010-04-12 NOTE — Progress Notes (Signed)
Vital signs stable, pain well controlled, alert and awake, follow up per surgeon, no anesthetic complications, May discharge from PACU.

## 2010-04-12 NOTE — Brief Op Note (Signed)
BRIEF OPERATIVE NOTE    Date of Procedure: 04/12/2010   Preoperative Diagnosis: LEFT STONE  Postoperative Diagnosis: LEFT STONE    Procedure:  LITHOTRIPSY EXTRACORPORAL SHOCKWAVE ESWL    Surgeon: Rudean Hitt, MD  Assistant(s): none  Anesthesia: General   Estimated Blood Loss: minimal  Specimens: none  Findings: See full operative note.  Complications: none  Implants: none

## 2010-04-12 NOTE — Other (Signed)
Preoperative flank skin condition-clear  Lead shield: yes  Patient ear protection: yes  Gel applied to patient's flank and Lithotripsy head: {yes  Starting power level: 3  Shock start time (first scout fluoroscopy): 0854  Shock stop time (last lithotripsy shock):0933  Ending power level.11  Total shocks 3350  Total fluoroscopy time:4:30  Postoperative flank skin condition:CLEAR

## 2010-04-15 NOTE — Op Note (Signed)
[facility]   [address]   [place], [place]. [address]   [phone]     OPERATIVE REPORT    NAME: Denise, Turner MR: [id number]  LOC: SO SEX: F ACCT: [id number]  DOB: April 02, 1981 AGE: 29 PT: S  ADMIT: 04/12/2010 DSCH: 04/12/2010 MSV: SUR      DATE OF PROCEDURE: 04/12/2010    PREPROCEDURE DIAGNOSIS: A 9-mm uteropelvic junction stone.    POSTPROCEDURE DIAGNOSIS: A 9-mm uteropelvic junction stone    NAME OF PROCEDURE: Extracorporeal shockwave lithotripsy. n    SURGEON: Gwynn Chalker E. Deloria Lair, MD    ANESTHESIA: General.    DESCRIPTION OF PROCEDURE: Number of shocks was 3350. Maximal power 11.  Postoperative images indicated excellent fragmentation of stone.                Rudean Hitt, MD A     This is an unverified document unless signed by physician.    TID: wmx DT: 04/15/2010 2:07 A  JOB: [id number] DOC#: [id number] DD: 04/12/2010    cc: Rudean Hitt, MD

## 2010-05-12 NOTE — Other (Signed)
Patient instructed to call the following numbers if they have any safety concerns before, during or after hospital encounter:  patient safety line:  [phone] ; patient relations: [phone].     Patient verbalizes understanding of instructions to take the following medications on the day of surgery:none  Patient verbalizes understanding of instructions to hold the following medications on the day of surgery:none  Patient verbalizes understanding of instructions to hold the following medications seven days pre op: none  Patient verbalizes understanding of instructions to continue all other medication as prescribed until the day of surgery.     PAT completed by phone with patient.  Reviewed verbally: "Patient's Guide to Surgery." .Patient verbalizes understanding of all instructions and need for ride to stay in building, drive patient home, and for responsible adult to be present 24 hours post op.       Type 1A surgery:    Labs per surgeon: no orders as of PAT  Labs per anesthesia protocol: uhcg DOS

## 2010-05-16 NOTE — Other (Signed)
Spoke with this patient to come in for [facility] before surgery

## 2010-05-19 LAB — URINALYSIS W/O MICRO
Bilirubin: NEGATIVE
Glucose: NEGATIVE MG/DL
Ketone: NEGATIVE MG/DL
Nitrites: POSITIVE — AB
Protein: NEGATIVE MG/DL
Specific gravity: 1.02 (ref 1.001–1.023)
Urobilinogen: 0.2 EU/DL (ref 0.2–1.0)
pH (UA): 7 (ref 5.0–9.0)

## 2010-05-19 LAB — METABOLIC PANEL, BASIC
Anion gap: 8 mmol/L (ref 7–16)
BUN: 9 MG/DL (ref 6–23)
CO2: 28 MMOL/L (ref 23–32)
Calcium: 9.1 MG/DL (ref 8.3–10.4)
Chloride: 105 MMOL/L (ref 98–107)
Creatinine: 0.9 MG/DL (ref 0.6–1.5)
GFR est AA: >60 mL/min/{1.73_m2} (ref >60)
GFR est non-AA: >60 mL/min/{1.73_m2} (ref >60)
Glucose: 80 MG/DL (ref 65–100)
Potassium: 3.4 MMOL/L — ABNORMAL LOW (ref 3.5–5.1)
Sodium: 141 MMOL/L (ref 136–145)

## 2010-05-19 LAB — URINE MICROSCOPIC
Casts: 0 /LPF
Crystals, urine: 0 /LPF
Mucus: 0 /LPF

## 2010-05-19 LAB — CBC W/O DIFF
HCT: 40.8 % (ref 35.8–46.3)
HGB: 13.1 g/dL (ref 11.7–15.4)
MCH: 30.5 PG (ref 26.1–32.9)
MCHC: 32.1 g/dL (ref 31.4–35.0)
MCV: 95.1 FL (ref 79.6–97.8)
MPV: 10.3 FL — ABNORMAL LOW (ref 10.8–14.1)
PLATELET: 218 10*3/uL (ref 150–450)
RBC: 4.29 M/uL (ref 4.05–5.25)
RDW: 13.6 % (ref 11.9–14.6)
WBC: 4.4 10*3/uL (ref 4.3–11.1)

## 2010-05-19 NOTE — Op Note (Signed)
Patient came in for labs

## 2010-05-20 NOTE — Other (Signed)
Phone update done. Patient verbalize understanding of all preop instructions, aware of 4 digit code. Patient instructed on NPO past midnight, need to take shower with Hibiclens, have responsible adult to drive them to surgery, stay here all through surgery and drive patient home, stay with patient 24 hours after surgery. Patient instructed to leave money, jewelry, at home, all piercings must be removed. Given education on pain management, hand hygiene and smoking. Patient instructed to call surgeon office and [phone] if decide to cancel the surgery.  Patient answered history questions at his best knowledge.    PT. INSTRUCTED TO CALL THE FOLLOWING NUMBERS IF ANY SAFETY CONCERNS BEFORE, DURING, OR AFTER HOSPITAL ENCOUNTER: PT. SAFETY LINE - [phone] OR PT. RELATIONS - [phone].    Patient instructed to take following meds day of surgery with small sip of water: none.   Patient instructed to hold following meds before surgery: none.   Patient instructed to hold following meds Morning of surgery: none.   Patient states she do not take any blood thinners.       Type of surgery: 1b  Labs per surgeon: done 11/3  Labs per anestesia protocol: none.

## 2010-05-23 MED ORDER — CEFAZOLIN 1 GRAM SOLUTION FOR INJECTION
1 gram | Freq: Once | INTRAMUSCULAR | Status: DC
Start: 2010-05-23 — End: 2010-05-24

## 2010-05-23 MED ORDER — CEFAZOLIN 1 GRAM SOLUTION FOR INJECTION
1 gram | Freq: Once | INTRAMUSCULAR | Status: DC
Start: 2010-05-23 — End: 2010-05-23

## 2010-05-23 NOTE — Other (Signed)
Faxed abnormal UA 4+ bacteria to DR Katrinka Blazing to review---spoke with Nelva Bush

## 2010-05-24 LAB — HCG URINE, QL. - POC: Pregnancy test,urine (POC): NEGATIVE

## 2010-05-24 MED ORDER — MIDAZOLAM 5 MG/ML IJ SOLN
5 mg/mL | INTRAMUSCULAR | Status: DC | PRN
Start: 2010-05-24 — End: 2010-05-24

## 2010-05-24 MED ORDER — CIPROFLOXACIN 500 MG TAB
500 mg | Freq: Once | ORAL | Status: AC
Start: 2010-05-24 — End: 2010-05-24
  Administered 2010-05-24: 16:00:00 via ORAL

## 2010-05-24 MED ORDER — LACTATED RINGERS IV
INTRAVENOUS | Status: DC
Start: 2010-05-24 — End: 2010-05-24
  Administered 2010-05-24: 15:00:00 via INTRAVENOUS

## 2010-05-24 MED ORDER — FENTANYL CITRATE (PF) 50 MCG/ML IJ SOLN
50 mcg/mL | INTRAMUSCULAR | Status: DC | PRN
Start: 2010-05-24 — End: 2010-05-24

## 2010-05-24 MED ORDER — LIDOCAINE HCL 1 % (10 MG/ML) IJ SOLN
10 mg/mL (1 %) | INTRAMUSCULAR | Status: DC | PRN
Start: 2010-05-24 — End: 2010-05-24

## 2010-05-24 MED ORDER — MIDAZOLAM 1 MG/ML IJ SOLN
1 mg/mL | Freq: Once | INTRAMUSCULAR | Status: AC | PRN
Start: 2010-05-24 — End: 2010-05-24
  Administered 2010-05-24: 16:00:00 via INTRAVENOUS

## 2010-05-24 MED ORDER — FAMOTIDINE 20 MG TAB
20 mg | Freq: Once | ORAL | Status: AC
Start: 2010-05-24 — End: 2010-05-24
  Administered 2010-05-24: 15:00:00 via ORAL

## 2010-05-24 MED ORDER — HYDROCODONE-ACETAMINOPHEN 10 MG-325 MG TAB
10-325 mg | ORAL_TABLET | Freq: Four times a day (QID) | ORAL | Status: DC | PRN
Start: 2010-05-24 — End: 2010-08-29

## 2010-05-24 NOTE — Op Note (Signed)
Preoperative flank skin condition: warm, dry and intact  Lead shield: YES  Patient ear protection: YES  Gel applied to patient's flank and Lithotripsy head: YES  Starting power level: 3  Shock start time (first scout fluoroscopy): 1134  Shock stop time (last lithotripsy shock): 1149  Ending power level: 10  Total shocks: 1000  Total fluoroscopy time: 2 min. 53 sec.  Postoperative flank skin condition: reddened 1.5cm area to right flank

## 2010-05-24 NOTE — Op Note (Signed)
Op Notes signed by Mcarthur Rossetti, MD at 05/29/10 1052                 Author: Mcarthur Rossetti, MD  Service: --  Author Type: Physician       Filed: 05/29/10 1052  Date of Service: 05/24/10 1432  Status: Signed          Editor: Mcarthur Rossetti, MD (Physician)          <!--EPICS-->                            [facility] [facility]<BR>                           [address]<BR>                          [place],  [place]. [address]<BR>                               [phone]<BR> <BR>                             OPERATIVE REPORT<BR> <BR> NAME:  Denise Turner, Denise Turner                        MR:  [id number]<NU> LOC:  SO                    SEX:  F               ACCT:  [id number]  DOB:  [birth year]            AGE:  29              PT:  S<BR> ADMIT:  05/24/2010          DSCH:  05/24/2010     MSV:  SUR<BR> <BR> <BR> DATE OF PROCEDURE: 05/24/2010<BR> <BR> PREPROCEDURE DIAGNOSIS: Right renal stone.<BR> <BR> POSTPROCEDURE DIAGNOSIS:  Right renal stone.<BR> <BR> NAME OF PROCEDURE:  Extracorporeal shock wave lithotripsy of right renal<BR> stone.<BR> <BR> SURGEON: Mcarthur Rossetti, MD<BR> <BR> ANESTHESIA: General.<BR> <BR> INDICATIONS FOR PROCEDURE:  The patient had a 6-mm right renal  stone and<BR> desired lithotripsy. Risks, benefits, and alternatives were fully<BR> discussed with the patient. She stated that she understood and wished to<BR> proceed.<BR> <BR> DESCRIPTION OF PROCEDURE: The patient was already placed on p.o.<BR> antibiotics  at home. General anesthesia was obtained. The patient's stone<BR> was localized under fluoroscopy and the patient received a total of 1000<BR> shocks at maximum power setting of 10 KV. There appeared to be excellent<BR> fragmentation of the stone and  the procedure was stopped at 1000 shocks<BR> based on the fact that I could no longer see any stone to treat. The<BR> patient was carried to recovery in good condition. There was no specimen<BR> to pathology. No blood loss.<BR> <BR> <BR>  <BR> <BR> <BR>  <BR> <BR> August Luz, MD       A<BR> <BR>             This is an unverified document unless signed by physician.<BR> <BR> TID:  wmx  DT:  05/24/2010  2:32 P<BR> JOB:  [id number]        DOC#:  [id number]            DD:  05/24/2010<BR> <BR> cc:   August Luz, MD<BR> <!--EPICE-->

## 2010-05-24 NOTE — Progress Notes (Signed)
Op [phone]

## 2010-05-24 NOTE — Other (Signed)
Given strainer . Pt had 3 other lithotripsy and knows how to use strainer.

## 2010-05-24 NOTE — Progress Notes (Signed)
Pre-op visit. Conveyed care and concern for patient. Prayed with patient and family.    Chaplain Adrian D. Wilks, M. Div.

## 2010-05-24 NOTE — Op Note (Signed)
Preoperative flank skin condition: warm, dry and intact  Lead shield: YES  Patient ear protection: YES  Gel applied to patient's flank and Lithotripsy head: YES  Starting power level: 3  Shock start time (first scout fluoroscopy): 1134  Shock stop time (last lithotripsy shock): 1149  Ending power level: 10  Total shocks: 1000  Total fluoroscopy time: 2 min. 53 sec.  Postoperative flank skin condition: reddened 1.5cm area to right flank

## 2010-05-24 NOTE — Brief Op Note (Signed)
BRIEF OPERATIVE NOTE    Date of Procedure: 05/24/2010   Preoperative Diagnosis: RIGHT RENAL STONE  Postoperative Diagnosis: RIGHT RENAL STONE    Procedure:  LITHOTRIPSY EXTRACORPORAL SHOCKWAVE ESWL    Surgeon: Mcarthur Rossetti, MD  Assistant(s): 0   Anesthesia: General   Estimated Blood Loss: 0  Specimens: * No specimens in log *   Findings: See full operative note.  Complications: 0  Implants: * No implants in log *

## 2010-05-24 NOTE — Progress Notes (Signed)
Vital signs stable, pain well controlled, alert and awake, follow up per surgeon, no anesthetic complications, May discharge from PACU.

## 2010-05-24 NOTE — Op Note (Signed)
[facility]   [address]   [place], [place]. [address]   [phone]     OPERATIVE REPORT    NAME: Denise, Turner MR: [id number]  LOC: SO SEX: F ACCT: [id number]  DOB: [birth year] AGE: 29 PT: S  ADMIT: 05/24/2010 DSCH: 05/24/2010 MSV: SUR      DATE OF PROCEDURE: 05/24/2010    PREPROCEDURE DIAGNOSIS: Right renal stone.    POSTPROCEDURE DIAGNOSIS: Right renal stone.    NAME OF PROCEDURE: Extracorporeal shock wave lithotripsy of right renal  stone.    SURGEON: Mcarthur Rossetti, MD    ANESTHESIA: General.    INDICATIONS FOR PROCEDURE: The patient had a 6-mm right renal stone and  desired lithotripsy. Risks, benefits, and alternatives were fully  discussed with the patient. She stated that she understood and wished to  proceed.    DESCRIPTION OF PROCEDURE: The patient was already placed on p.o.  antibiotics at home. General anesthesia was obtained. The patient's stone  was localized under fluoroscopy and the patient received a total of 1000  shocks at maximum power setting of 10 KV. There appeared to be excellent  fragmentation of the stone and the procedure was stopped at 1000 shocks  based on the fact that I could no longer see any stone to treat. The  patient was carried to recovery in good condition. There was no specimen  to pathology. No blood loss.                August Luz, MD A     This is an unverified document unless signed by physician.    TID: wmx DT: 05/24/2010 2:32 P  JOB: [id number] DOC#: [id number] DD: 05/24/2010    cc: August Luz, MD

## 2010-05-24 NOTE — Other (Signed)
Instructions to aunt . Voided bloody urine . Drinking fluids well. No distress.

## 2010-08-29 NOTE — ED Notes (Signed)
Pt signed for discharge on wrong chart.  I have reviewed discharge instructions with the patient.  The patient verbalized understanding.

## 2010-08-29 NOTE — ED Provider Notes (Signed)
HPI Comments: Left flank pain, h/o kidney stones and UTIs    Patient is a 30 y.o. female presenting with flank pain. The history is provided by the patient.   Flank Pain   This is a new problem. The current episode started 6 to 12 hours ago. The problem has not changed since onset.The problem occurs constantly. Patient reports no work related injury.The pain is associated with no known injury. Pain location: left flank. The pain does not radiate. The pain is moderate. The pain is the same all the time. Associated symptoms include dysuria. Pertinent negatives include no chest pain, no fever, no numbness, no weight loss, no headaches, no abdominal pain, no abdominal swelling, no bowel incontinence, no perianal numbness, no bladder incontinence, no pelvic pain, no leg pain, no paresthesias, no paresis, no tingling and no weakness. She has tried nothing for the symptoms. Risk factors include history of kidney stones.        Past Medical History   Diagnosis Date   ??? Postpartum fever 05/09/2008   ??? Anemia of pregnancy 05/09/2008   ??? Previous cesarean delivery, delivered 05/09/2008   ??? Other ill-defined conditions      kidney stones   ??? General conditions of the kidney and ureter      kidney stones   ??? General conditions of the kidney and ureter      hydronephrosis   ??? GERD (gastroesophageal reflux disease)      mild        Past Surgical History   Procedure Date   ??? Cesarean delivery only 2001, 2006, 2009   ??? Hx other surgical 201, 2006     lithotripsy x2   ??? Hx other surgical 10/11/09     kidney-stent   ??? Hx other surgical      c-section   ??? Hx lithotripsy      x3   ??? Hx cesarean section      x4   ??? Hx tonsillectomy    ??? Hx tubal ligation      Nov 17, 2008         Family History   Problem Relation Age of Onset   ??? Alcohol abuse Neg Hx    ??? Arthritis-rheumatoid Neg Hx    ??? Asthma Neg Hx    ??? Bleeding Prob Neg Hx    ??? Diabetes Neg Hx    ??? Heart Disease Neg Hx    ??? Migraines Neg Hx    ??? Psychiatric Disorder Neg Hx     ??? Stroke Neg Hx    ??? Elevated Lipids Neg Hx    ??? Headache Neg Hx    ??? Hypertension Neg Hx    ??? Lung Disease Neg Hx    ??? Mental Retardation Neg Hx    ??? Cancer Maternal Aunt         History     Social History   ??? Marital Status: Single     Spouse Name: N/A     Number of Children: N/A   ??? Years of Education: N/A     Occupational History   ??? Not on file.     Social History Main Topics   ??? Smoking status: Former Smoker -- 0.2 packs/day     Quit date: 04/08/2007   ??? Smokeless tobacco: Never Used   ??? Alcohol Use: No   ??? Drug Use: No   ??? Sexually Active: No     Other Topics Concern   ??? Not  on file     Social History Narrative   ??? No narrative on file                  ALLERGIES: Meperidine      Review of Systems   Unable to perform ROS  Constitutional: Negative for fever and weight loss.   Cardiovascular: Negative for chest pain.   Gastrointestinal: Negative for abdominal pain and bowel incontinence.   Genitourinary: Positive for dysuria and flank pain. Negative for bladder incontinence, decreased urine volume, vaginal bleeding, vaginal discharge, difficulty urinating, vaginal pain and pelvic pain.   Neurological: Negative for tingling, weakness, numbness, headaches and paresthesias.   [all other systems reviewed and are negative        Filed Vitals:    08/29/10 1955   BP: 116/54   Pulse: 99   Temp: 98.9 ??F (37.2 ??C)   Resp: 18   Height: 6' (1.829 m)   Weight: 146 lb (66.225 kg)   SpO2: 97%            Physical Exam   [nursing notereviewed.  Constitutional: She is oriented to person, place, and time. She appears well-developed and well-nourished. No distress.   HENT:   Head: Normocephalic and atraumatic.   Abdominal: Soft. Bowel sounds are normal. She exhibits no distension.        Mild flank CVA TTP   Neurological: She is alert and oriented to person, place, and time.   Skin: Skin is warm and dry. She is not diaphoretic.   Psychiatric: She has a normal mood and affect. Her behavior is normal.        MDM      Amount and/or Complexity of Data Reviewed:   Clinical lab tests:  [reviewed and ordered  Tests in the radiology section of CPT??:  [ordered and reviewed  Tests in the medicine section of the CPT??:  [ordered and reviewed   Independant visualization of image, tracing, or specimen:  Yes  Risk of Significant Complications, Morbidity, and/or Mortality:   Presenting problems:  [high  Diagnostic procedures:  [high  Management options:  [high  Progress:   Patient progress:  Denise Turner      Procedures

## 2010-08-29 NOTE — ED Notes (Signed)
Pt called to room 12, eating large dinner and drinking in waiting room

## 2010-08-29 NOTE — ED Notes (Signed)
Pt states she has a kidney stone on the left side and has a history of same

## 2010-08-30 LAB — URINE MICROSCOPIC
Casts: 0 /LPF
Crystals, urine: 0 /LPF

## 2010-08-30 LAB — HCG URINE, QL. - POC: Pregnancy test,urine (POC): NEGATIVE

## 2010-08-30 MED ORDER — HYDROCODONE-ACETAMINOPHEN 7.5 MG-500 MG TAB
ORAL_TABLET | ORAL | Status: DC | PRN
Start: 2010-08-30 — End: 2010-09-28

## 2010-08-30 MED ORDER — CIPROFLOXACIN 500 MG TAB
500 mg | ORAL_TABLET | Freq: Two times a day (BID) | ORAL | Status: AC
Start: 2010-08-30 — End: 2010-09-08

## 2010-09-01 LAB — CULTURE, URINE
Culture result:: >100000
Culture: >100000

## 2010-09-28 LAB — METABOLIC PANEL, BASIC
Anion gap: 6 mmol/L — ABNORMAL LOW (ref 7–16)
BUN: 7 MG/DL (ref 6–23)
CO2: 29 MMOL/L (ref 23–32)
Calcium: 9.5 MG/DL (ref 8.3–10.4)
Chloride: 105 MMOL/L (ref 98–107)
Creatinine: 0.9 MG/DL (ref 0.6–1.5)
GFR est AA: >60 mL/min/{1.73_m2} (ref >60)
GFR est non-AA: >60 mL/min/{1.73_m2} (ref >60)
Glucose: 88 MG/DL (ref 65–100)
Potassium: 4.2 MMOL/L (ref 3.5–5.1)
Sodium: 140 MMOL/L (ref 136–145)

## 2010-09-28 LAB — CBC WITH AUTOMATED DIFF
ABS. BASOPHILS: 0 10*3/uL (ref 0.0–0.2)
ABS. EOSINOPHILS: 0 10*3/uL (ref 0.0–0.8)
ABS. IMM. GRANS.: 0 10*3/uL (ref 0.0–0.5)
ABS. LYMPHOCYTES: 2.2 10*3/uL (ref 0.5–4.6)
ABS. MONOCYTES: 0.3 10*3/uL (ref 0.1–1.3)
ABS. NEUTROPHILS: 3.2 10*3/uL (ref 1.7–8.2)
BASOPHILS: 0 % (ref 0.0–2.0)
EOSINOPHILS: 1 % (ref 0.5–7.8)
HCT: 37.5 % (ref 35.8–46.3)
HGB: 12.3 g/dL (ref 11.7–15.4)
IMMATURE GRANULOCYTES: 0.2 % (ref 0.0–5.0)
LYMPHOCYTES: 38 % (ref 13–44)
MCH: 30.5 PG (ref 26.1–32.9)
MCHC: 32.8 g/dL (ref 31.4–35.0)
MCV: 93.1 FL (ref 79.6–97.8)
MONOCYTES: 6 % (ref 4.0–12.0)
MPV: 9.5 FL — ABNORMAL LOW (ref 10.8–14.1)
NEUTROPHILS: 55 % (ref 43–78)
PLATELET: 271 10*3/uL (ref 150–450)
RBC: 4.03 M/uL — ABNORMAL LOW (ref 4.05–5.25)
RDW: 13.6 % (ref 11.9–14.6)
WBC: 5.8 10*3/uL (ref 4.3–11.1)

## 2010-09-28 LAB — URINE MICROSCOPIC
Casts: 0 /LPF
Crystals, urine: 0 /LPF
Mucus: 0 /LPF

## 2010-09-28 LAB — HCG URINE, QL. - POC: Pregnancy test,urine (POC): NEGATIVE

## 2010-09-28 MED ORDER — SODIUM CHLORIDE 0.9% BOLUS IV
0.9 % | Freq: Once | INTRAVENOUS | Status: AC
Start: 2010-09-28 — End: 2010-09-28
  Administered 2010-09-28: 18:00:00 via INTRAVENOUS

## 2010-09-28 MED ORDER — KETOROLAC TROMETHAMINE 30 MG/ML INJECTION
30 mg/mL (1 mL) | INTRAMUSCULAR | Status: AC
Start: 2010-09-28 — End: 2010-09-28
  Administered 2010-09-28: 18:00:00 via INTRAVENOUS

## 2010-09-28 MED ORDER — ONDANSETRON 8 MG TAB, RAPID DISSOLVE
8 mg | ORAL | Status: AC
Start: 2010-09-28 — End: 2010-09-28
  Administered 2010-09-28: 18:00:00 via ORAL

## 2010-09-28 NOTE — ED Notes (Cosign Needed)
Called patient and left message to return call with pharmacy number, pt needs an abx called in. Number she left for pharmacy was disconnected, pt returned call and uses [facility] on [facility] [phone], will call keflex 500 mg one po qid x 10 days and toradol 10 mg one q 6 hr prn pain #15, NR. Called in at 18:15.

## 2010-09-28 NOTE — ED Notes (Cosign Needed)
Pt still waiting for lab results, feeling much better, states she just passed a stone in the bathroom

## 2010-09-28 NOTE — ED Provider Notes (Signed)
HPI Comments: Pt has a long history of kidney stones and started hurting again in both kidneys about 3 days ago. She has been nauseous, but, is not vomiting. She is not having a fever. She is having some dysuria, but, no vaginal discharge.    Patient is a 30 y.o. female presenting with back pain. The history is provided by the patient.   Back Pain   This is a recurrent problem. Episode onset: 3 days now. The problem has been gradually worsening. The problem occurs constantly. The pain is associated with no known injury. Pain location: "my kidney area" The quality of the pain is described as aching. The pain is at a severity of 8/10. The pain is moderate. Associated symptoms include dysuria. Pertinent negatives include no chest pain, no fever, no numbness, no weight loss, no headaches, no abdominal pain, no abdominal swelling, no bowel incontinence, no perianal numbness, no bladder incontinence, no pelvic pain, no leg pain, no paresthesias, no paresis, no tingling and no weakness. Associated symptoms comments: hematuria. Treatments tried: drinking vinegar. The treatment provided no relief.        Past Medical History   Diagnosis Date   ??? Postpartum fever 05/09/2008   ??? Anemia of pregnancy 05/09/2008   ??? Previous cesarean delivery, delivered 05/09/2008   ??? General conditions of the kidney and ureter      kidney stones   ??? General conditions of the kidney and ureter      hydronephrosis   ??? GERD (gastroesophageal reflux disease)      mild   ??? Other ill-defined conditions      kidney stones        Past Surgical History   Procedure Date   ??? Cesarean delivery only 2001, 2006, 2009   ??? Hx other surgical 201, 2006     lithotripsy x2   ??? Hx other surgical 10/11/09     kidney-stent   ??? Hx other surgical      c-section   ??? Hx lithotripsy      x3   ??? Hx cesarean section      x4   ??? Hx tonsillectomy    ??? Hx tubal ligation      Nov 17, 2008         Family History   Problem Relation Age of Onset   ??? Alcohol abuse Neg Hx     ??? Arthritis-rheumatoid Neg Hx    ??? Asthma Neg Hx    ??? Bleeding Prob Neg Hx    ??? Diabetes Neg Hx    ??? Heart Disease Neg Hx    ??? Migraines Neg Hx    ??? Psychiatric Disorder Neg Hx    ??? Stroke Neg Hx    ??? Elevated Lipids Neg Hx    ??? Headache Neg Hx    ??? Hypertension Neg Hx    ??? Lung Disease Neg Hx    ??? Mental Retardation Neg Hx    ??? Cancer Maternal Aunt         History     Social History   ??? Marital Status: Single     Spouse Name: N/A     Number of Children: N/A   ??? Years of Education: N/A     Occupational History   ??? Not on file.     Social History Main Topics   ??? Smoking status: Former Smoker -- 0.2 packs/day     Quit date: 04/08/2007   ??? Smokeless tobacco: Never Used   ???  Alcohol Use: No   ??? Drug Use: No   ??? Sexually Active: No     Other Topics Concern   ??? Not on file     Social History Narrative   ??? No narrative on file                  ALLERGIES: Meperidine      Review of Systems   Constitutional: Negative.  Negative for fever and weight loss.   Eyes: Negative.    Respiratory: Negative.    Cardiovascular: Negative.  Negative for chest pain.   Gastrointestinal: Negative.  Negative for abdominal pain and bowel incontinence.   Genitourinary: Positive for dysuria and urgency. Negative for bladder incontinence and pelvic pain.   Musculoskeletal: Positive for back pain.   Skin: Negative.    Neurological: Negative.  Negative for tingling, weakness, numbness, headaches and paresthesias.   Hematological: Negative.    Psychiatric/Behavioral: Negative.    [all other systems reviewed and are negative        Filed Vitals:    09/28/10 1240   BP: 126/72   Pulse: 69   Temp: 98 ??F (36.7 ??C)   Resp: 18   Height: 6' (1.829 m)   Weight: 140 lb (63.504 kg)   SpO2: 99%            Physical Exam   [nursing notereviewed.  Constitutional: She is oriented to person, place, and time. She appears well-developed and well-nourished.   HENT:   Head: Normocephalic and atraumatic.   Right Ear: External ear normal.   Left Ear: External ear normal.    Nose: Nose normal.   Mouth/Throat: Oropharynx is clear and moist.   Eyes: Conjunctivae and EOM are normal. Pupils are equal, round, and reactive to light.   Neck: Normal range of motion. Neck supple.   Cardiovascular: Normal rate, regular rhythm, normal heart sounds and intact distal pulses.    Pulmonary/Chest: Effort normal and breath sounds normal.   Abdominal: Soft. Bowel sounds are normal.   Musculoskeletal:        Mild tenderness to palpation over bilateral CVA area's. Mildly limited ROM due to pain. No rash to area.   Neurological: She is alert and oriented to person, place, and time. She has normal reflexes.   Skin: Skin is warm and dry.   Psychiatric: She has a normal mood and affect. Her behavior is normal. Judgment and thought content normal.        MDM    Procedures    The patient was observed in the ED.    Results Reviewed:      Recent Results (from the past 24 hour(s))   URINE MICROSCOPIC    Collection Time    09/28/10 12:50 PM       Component Value Range    WBC 10-20  0 (/HPF)    RBC 5-10  0 (/HPF)    Epithelial cells 5-10  0 (/HPF)    Bacteria TRACE  0 (/HPF)    Casts 0  0 (/LPF)    Crystals 0  0 (/LPF)    Mucus 0  0 (/LPF)   HCG URINE, QL. - POC    Collection Time    09/28/10 12:53 PM       Component Value Range    Pregnancy test,urine (POC) NEGATIVE   NEGATIVE    METABOLIC PANEL, BASIC    Collection Time    09/28/10  1:40 PM       Component  Value Range    Sodium 140  136 - 145 (MMOL/L)    Potassium 4.2  3.5 - 5.1 (MMOL/L)    Chloride 105  98 - 107 (MMOL/L)    CO2 29  23 - 32 (MMOL/L)    Anion gap 6 (*) 7 - 16 (mmol/L)    Glucose 88  65 - 100 (MG/DL)    BUN 7  6 - 23 (MG/DL)    Creatinine 0.9  0.6 - 1.5 (MG/DL)    GFR est AA >82  >95 (ml/min/1.35m2)    GFR est non-AA >60  >60 (ml/min/1.57m2)    Calcium 9.5  8.3 - 10.4 (MG/DL)   CBC WITH AUTOMATED DIFF    Collection Time    09/28/10  1:40 PM       Component Value Range    WBC 5.8  4.3 - 11.1 (K/uL)    RBC 4.03 (*) 4.05 - 5.25 (M/uL)     HGB 12.3  11.7 - 15.4 (g/dL)    HCT 62.1  30.8 - 65.7 (%)    MCV 93.1  79.6 - 97.8 (FL)    MCH 30.5  26.1 - 32.9 (PG)    MCHC 32.8  31.4 - 35.0 (g/dL)    RDW 84.6  96.2 - 95.2 (%)    PLATELET 271  150 - 450 (K/uL)    MPV 9.5 (*) 10.8 - 14.1 (FL)    DF AUTOMATED      NEUTROPHILS 55  43 - 78 (%)    LYMPHOCYTES 38  13 - 44 (%)    MONOCYTES 6  4.0 - 12.0 (%)    EOSINOPHILS 1  0.5 - 7.8 (%)    BASOPHILS 0  0.0 - 2.0 (%)    IMMATURE GRANULOCYTES 0.2  0.0 - 5.0 (%)    ABS. NEUTROPHILS 3.2  1.7 - 8.2 (K/UL)    ABS. LYMPHOCYTES 2.2  0.5 - 4.6 (K/UL)    ABS. MONOCYTES 0.3  0.1 - 1.3 (K/UL)    ABS. EOSINOPHILS 0.0  0.0 - 0.8 (K/UL)    ABS. BASOPHILS 0.0  0.0 - 0.2 (K/UL)    ABS. IMM. GRANS. 0.0  0.0 - 0.5 (K/UL)   Pt had to leave to pick up kids from school prior to urine being resulted. Will call in abx for patient and culture urine.    I discussed the results of all labs, procedures, radiographs, and treatments with the patient and available family.  Treatment plan is agreed upon and the patient is ready for discharge.  All voiced understanding of the discharge plan and medication instructions or changes as appropriate.  Questions about treatment in the ED were answered.  All were encouraged to return should symptoms worsen or new problems develop.

## 2010-10-01 LAB — CULTURE, URINE
Culture result:: >100000
Culture: >100000

## 2010-10-03 LAB — URINE MICROSCOPIC
Casts: 0 /LPF
Crystals, urine: 0 /LPF
WBC: 0 /HPF

## 2010-10-03 LAB — HCG URINE, QL. - POC: Pregnancy test,urine (POC): NEGATIVE

## 2010-10-03 MED ORDER — HYDROCODONE-ACETAMINOPHEN 7.5 MG-500 MG TAB
ORAL_TABLET | Freq: Three times a day (TID) | ORAL | Status: DC | PRN
Start: 2010-10-03 — End: 2010-11-07

## 2010-10-03 NOTE — ED Notes (Signed)
I have reviewed discharge instructions with the patient.  The patient verbalized understanding. prescription given to pt. Pt ambulatory. Pt not in any distress.

## 2010-10-03 NOTE — ED Provider Notes (Signed)
HPI Comments: Pt is here for recurrent flank pain. She was seen [facility] on [facility]10 and had to leave early to get her kids. She had a normal BMP and CBC then with a UTI. She had a CT in 02/12 that showed small renal stones bilaterally but no ureteral stones. She is followed by Dr. Deloria Lair the urologist who has her scheduled for an IVP on Wednesday the 21st. She has been on her keflex and toradol PO but the toradol isn't helping her pain. She is not nauseaous and the pain is unchanged.    Patient is a 30 y.o. female presenting with flank pain. The history is provided by the patient.   Flank Pain   This is a recurrent problem.        Past Medical History   Diagnosis Date   ??? Postpartum fever 05/09/2008   ??? Anemia of pregnancy 05/09/2008   ??? Previous cesarean delivery, delivered 05/09/2008   ??? General conditions of the kidney and ureter      kidney stones   ??? General conditions of the kidney and ureter      hydronephrosis   ??? GERD (gastroesophageal reflux disease)      mild   ??? Other ill-defined conditions      kidney stones        Past Surgical History   Procedure Date   ??? Cesarean delivery only 2001, 2006, 2009   ??? Hx other surgical 201, 2006     lithotripsy x2   ??? Hx other surgical 10/11/09     kidney-stent   ??? Hx other surgical      c-section   ??? Hx lithotripsy      x3   ??? Hx tonsillectomy    ??? Hx cesarean section      x4   ??? Hx tubal ligation      Nov 17, 2008         Family History   Problem Relation Age of Onset   ??? Alcohol abuse Neg Hx    ??? Arthritis-rheumatoid Neg Hx    ??? Asthma Neg Hx    ??? Bleeding Prob Neg Hx    ??? Diabetes Neg Hx    ??? Heart Disease Neg Hx    ??? Migraines Neg Hx    ??? Psychiatric Disorder Neg Hx    ??? Stroke Neg Hx    ??? Elevated Lipids Neg Hx    ??? Headache Neg Hx    ??? Hypertension Neg Hx    ??? Lung Disease Neg Hx    ??? Mental Retardation Neg Hx    ??? Cancer Maternal Aunt         History     Social History   ??? Marital Status: Single     Spouse Name: N/A     Number of Children: N/A    ??? Years of Education: N/A     Occupational History   ??? Not on file.     Social History Main Topics   ??? Smoking status: Former Smoker -- 0.2 packs/day     Quit date: 04/08/2007   ??? Smokeless tobacco: Never Used   ??? Alcohol Use: No   ??? Drug Use: No   ??? Sexually Active: No     Other Topics Concern   ??? Not on file     Social History Narrative   ??? No narrative on file  ALLERGIES: Meperidine      Review of Systems   Constitutional: Negative.    HENT: Negative.    Eyes: Negative.    Respiratory: Negative.    Cardiovascular: Negative.    Gastrointestinal: Negative.    Genitourinary: Positive for flank pain.   Musculoskeletal: Negative.    Skin: Negative.    Neurological: Negative.    Hematological: Negative.    Psychiatric/Behavioral: Negative.    [all other systems reviewed and are negative        Filed Vitals:    10/03/10 1110 10/03/10 1112   BP: 122/71    Pulse: 89    Temp:  98.6 ??F (37 ??C)   Resp: 16    Height:  6' (1.829 m)   Weight:  145 lb (65.772 kg)   SpO2: 100%             Physical Exam   [nursing notereviewed.  Constitutional: She is oriented to person, place, and time. She appears well-developed and well-nourished.   HENT:   Head: Normocephalic and atraumatic.   Right Ear: External ear normal.   Left Ear: External ear normal.   Nose: Nose normal.   Mouth/Throat: Oropharynx is clear and moist.   Eyes: Conjunctivae and EOM are normal. Pupils are equal, round, and reactive to light.   Neck: Normal range of motion. Neck supple.   Cardiovascular: Normal rate, regular rhythm, normal heart sounds and intact distal pulses.    Pulmonary/Chest: Effort normal and breath sounds normal.   Abdominal: Soft. Bowel sounds are normal.   Musculoskeletal: Normal range of motion.   Neurological: She is alert and oriented to person, place, and time. She has normal reflexes.   Skin: Skin is warm and dry.   Psychiatric: She has a normal mood and affect. Her behavior is normal. Judgment and thought content normal.         MDM    Procedures    The patient was observed in the ED.    Results Reviewed:      Recent Results (from the past 24 hour(s))   URINE MICROSCOPIC    Collection Time    10/03/10 12:00 PM       Component Value Range    WBC 0  0 (/HPF)    RBC 0-3  0 (/HPF)    Epithelial cells 0-3  0 (/HPF)    Bacteria TRACE  0 (/HPF)    Casts 0  0 (/LPF)    Crystals 0  0 (/LPF)    Mucus 1+ (*) 0 (/LPF)   Pt to keep appt Wednesday for IVP. ED sooner if any change or worse.    I discussed the results of all labs, procedures, radiographs, and treatments with the patient and available family.  Treatment plan is agreed upon and the patient is ready for discharge.  All voiced understanding of the discharge plan and medication instructions or changes as appropriate.  Questions about treatment in the ED were answered.  All were encouraged to return should symptoms worsen or new problems develop.

## 2010-11-07 NOTE — ED Notes (Signed)
Patient reports ears, nose and throat pain x 2 weeks.  Patient reports temporary relief with Tylenol Sinus.

## 2010-11-08 MED ORDER — BENZONATATE 200 MG CAP
200 mg | ORAL_CAPSULE | Freq: Three times a day (TID) | ORAL | Status: AC | PRN
Start: 2010-11-08 — End: 2010-11-15

## 2010-11-08 MED ORDER — PREDNISONE 20 MG TAB
20 mg | ORAL_TABLET | Freq: Two times a day (BID) | ORAL | Status: AC
Start: 2010-11-08 — End: 2010-11-17

## 2010-11-08 MED ORDER — DEXAMETHASONE SODIUM PHOSPHATE 10 MG/ML IJ SOLN
10 mg/mL | INTRAMUSCULAR | Status: AC
Start: 2010-11-08 — End: 2010-11-08
  Administered 2010-11-08: 09:00:00 via INTRAMUSCULAR

## 2010-11-08 MED ORDER — AZITHROMYCIN 500 MG TAB
500 mg | ORAL_TABLET | Freq: Every day | ORAL | Status: AC
Start: 2010-11-08 — End: 2010-11-11

## 2010-11-08 MED ORDER — DIPHENHYDRAMINE HCL 50 MG/ML IJ SOLN
50 mg/mL | INTRAMUSCULAR | Status: AC
Start: 2010-11-08 — End: 2010-11-08
  Administered 2010-11-08: 09:00:00 via INTRAMUSCULAR

## 2010-11-08 NOTE — ED Provider Notes (Signed)
Patient is a 30 y.o. female presenting with nasal congestion. The history is provided by the patient. No language interpreter was used.   Nasal Congestion  This is a new problem. The current episode started more than 1 week ago (three weeks). The problem occurs constantly. The problem has not changed since onset.Associated symptoms comments: Cough, runny nose, chils, bodyaches  . The symptoms are aggravated by coughing. The symptoms are relieved by nothing. She has tried acetaminophen for the symptoms. The treatment provided no relief.        Past Medical History   Diagnosis Date   ??? Postpartum fever 05/09/2008   ??? Anemia of pregnancy 05/09/2008   ??? Previous cesarean delivery, delivered 05/09/2008   ??? General conditions of the kidney and ureter      kidney stones   ??? General conditions of the kidney and ureter      hydronephrosis   ??? GERD (gastroesophageal reflux disease)      mild   ??? Other ill-defined conditions      kidney stones        Past Surgical History   Procedure Date   ??? Cesarean delivery only 2001, 2006, 2009   ??? Hx other surgical 201, 2006     lithotripsy x2   ??? Hx other surgical 10/11/09     kidney-stent   ??? Hx other surgical      c-section   ??? Hx lithotripsy      x3   ??? Hx tonsillectomy    ??? Hx cesarean section      x4   ??? Hx tubal ligation      Nov 17, 2008         Family History   Problem Relation Age of Onset   ??? Alcohol abuse Neg Hx    ??? Arthritis-rheumatoid Neg Hx    ??? Asthma Neg Hx    ??? Bleeding Prob Neg Hx    ??? Diabetes Neg Hx    ??? Heart Disease Neg Hx    ??? Migraines Neg Hx    ??? Psychiatric Disorder Neg Hx    ??? Stroke Neg Hx    ??? Elevated Lipids Neg Hx    ??? Headache Neg Hx    ??? Hypertension Neg Hx    ??? Lung Disease Neg Hx    ??? Mental Retardation Neg Hx    ??? Cancer Maternal Aunt         History     Social History   ??? Marital Status: Single     Spouse Name: N/A     Number of Children: N/A   ??? Years of Education: N/A     Occupational History   ??? Not on file.     Social History Main Topics    ??? Smoking status: Former Smoker -- 0.2 packs/day     Quit date: 04/08/2007   ??? Smokeless tobacco: Never Used   ??? Alcohol Use: No   ??? Drug Use: No   ??? Sexually Active: No     Other Topics Concern   ??? Not on file     Social History Narrative   ??? No narrative on file                  ALLERGIES: Meperidine      Review of Systems   Constitutional: Positive for chills.   HENT: Positive for ear pain, congestion, sore throat and rhinorrhea.    Respiratory: Positive for cough.    [  all other systems reviewed and are negative        Filed Vitals:    11/08/10 0334 11/08/10 0343 11/08/10 0347 11/08/10 0350   BP:       Pulse:       Temp:       Resp:       Height:       Weight:       SpO2: 98% 99% 98% 98%            Physical Exam   [nursing notereviewed.  Constitutional: She is oriented to person, place, and time. She appears well-developed and well-nourished.   HENT:   Head: Normocephalic and atraumatic.   Right Ear: External ear normal.   Left Ear: External ear normal.   Nose: Nose normal.   Mouth/Throat: Oropharyngeal exudate present.   Eyes: Conjunctivae and EOM are normal. Pupils are equal, round, and reactive to light.   Neck: Normal range of motion. Neck supple.   Cardiovascular: Normal rate, regular rhythm, normal heart sounds and intact distal pulses.    Pulmonary/Chest: Effort normal and breath sounds normal. No respiratory distress. She has no wheezes.   Abdominal: Soft. Bowel sounds are normal.   Musculoskeletal: Normal range of motion. She exhibits no edema and no tenderness.   Neurological: She is alert and oriented to person, place, and time. No cranial nerve deficit. She exhibits normal muscle tone.   Skin: Skin is warm and dry.   Psychiatric: She has a normal mood and affect. Her behavior is normal. Judgment and thought content normal.        MDM    Procedures

## 2010-11-08 NOTE — ED Notes (Signed)
Pt sleeping in [place] C bed waiting for ride

## 2010-11-08 NOTE — ED Notes (Signed)
Patient ambulated to ed room #2 for exam with no distress.

## 2010-11-08 NOTE — ED Notes (Signed)
Pt verbalized understanding of discharge teaching  Pt ambulatory to hall C bed to sleep until ride "gets here"  [facility]

## 2010-11-18 LAB — CBC WITH AUTOMATED DIFF
ABS. BASOPHILS: 0 10*3/uL (ref 0.0–0.2)
ABS. EOSINOPHILS: 0 10*3/uL (ref 0.0–0.8)
ABS. IMM. GRANS.: 0 10*3/uL (ref 0.0–0.5)
ABS. LYMPHOCYTES: 2.6 10*3/uL (ref 0.5–4.6)
ABS. MONOCYTES: 0.3 10*3/uL (ref 0.1–1.3)
ABS. NEUTROPHILS: 3.1 10*3/uL (ref 1.7–8.2)
BASOPHILS: 0 % (ref 0.0–2.0)
EOSINOPHILS: 1 % (ref 0.5–7.8)
HCT: 38 % (ref 35.8–46.3)
HGB: 12.7 g/dL (ref 11.7–15.4)
IMMATURE GRANULOCYTES: 0 % (ref 0.0–5.0)
LYMPHOCYTES: 43 % (ref 13–44)
MCH: 30.8 PG (ref 26.1–32.9)
MCHC: 33.4 g/dL (ref 31.4–35.0)
MCV: 92 FL (ref 79.6–97.8)
MONOCYTES: 5 % (ref 4.0–12.0)
MPV: 9.5 FL — ABNORMAL LOW (ref 10.8–14.1)
NEUTROPHILS: 51 % (ref 43–78)
PLATELET: 280 10*3/uL (ref 150–450)
RBC: 4.13 M/uL (ref 4.05–5.25)
RDW: 13.8 % (ref 11.9–14.6)
WBC: 6.1 10*3/uL (ref 4.3–11.1)

## 2010-11-18 LAB — HCG URINE, QL. - POC: Pregnancy test,urine (POC): NEGATIVE

## 2010-11-18 LAB — URINE MICROSCOPIC
Casts: 0 /LPF
Crystals, urine: 0 /LPF
Mucus: 0 /LPF
RBC: 0 /HPF

## 2010-11-18 LAB — METABOLIC PANEL, BASIC
Anion gap: 7 mmol/L (ref 7–16)
BUN: 10 MG/DL (ref 6–23)
CO2: 25 MMOL/L (ref 23–32)
Calcium: 9.2 MG/DL (ref 8.3–10.4)
Chloride: 107 MMOL/L (ref 98–107)
Creatinine: 1 MG/DL (ref 0.6–1.5)
GFR est AA: >60 mL/min/{1.73_m2} (ref >60)
GFR est non-AA: >60 mL/min/{1.73_m2} (ref >60)
Glucose: 87 MG/DL (ref 65–100)
Potassium: 4.5 MMOL/L (ref 3.5–5.1)
Sodium: 139 MMOL/L (ref 136–145)

## 2010-11-18 MED ORDER — CEPHALEXIN 500 MG CAP
500 mg | ORAL_CAPSULE | Freq: Four times a day (QID) | ORAL | Status: AC
Start: 2010-11-18 — End: 2010-11-25

## 2010-11-18 MED ORDER — KETOROLAC TROMETHAMINE 30 MG/ML INJECTION
30 mg/mL (1 mL) | INTRAMUSCULAR | Status: AC
Start: 2010-11-18 — End: 2010-11-18
  Administered 2010-11-18: 17:00:00 via INTRAVENOUS

## 2010-11-18 MED ORDER — ONDANSETRON (PF) 4 MG/2 ML INJECTION
4 mg/2 mL | INTRAMUSCULAR | Status: AC
Start: 2010-11-18 — End: 2010-11-18
  Administered 2010-11-18: 17:00:00 via INTRAVENOUS

## 2010-11-18 MED ORDER — ONDANSETRON HCL 8 MG TAB
8 mg | ORAL_TABLET | Freq: Three times a day (TID) | ORAL | Status: DC | PRN
Start: 2010-11-18 — End: 2011-01-29

## 2010-11-18 MED ORDER — KETOROLAC TROMETHAMINE 10 MG TAB
10 mg | ORAL_TABLET | Freq: Four times a day (QID) | ORAL | Status: DC | PRN
Start: 2010-11-18 — End: 2011-01-29

## 2010-11-18 MED ORDER — SODIUM CHLORIDE 0.9% BOLUS IV
0.9 % | Freq: Once | INTRAVENOUS | Status: AC
Start: 2010-11-18 — End: 2010-11-18
  Administered 2010-11-18: 17:00:00 via INTRAVENOUS

## 2010-11-18 NOTE — ED Provider Notes (Addendum)
HPI Comments: Pt is here with left flank pain. She has a long history of kidney stones and see's Dr. Deloria Lair for that. She is scheduled for an IVP sometime in the near future but has been hurting worse in the last 3 days. She does have some nausea but no vomiting or fever.    Patient is a 30 y.o. female presenting with flank pain. The history is provided by the patient.   Flank Pain   This is a recurrent problem. Episode onset: 3 days now. The problem has been gradually worsening. The problem occurs constantly. The pain is associated with no known injury. Pain location: left CVA area. The quality of the pain is described as aching. The pain is at a severity of 10/10. The pain is severe. Associated symptoms include dysuria. Pertinent negatives include no chest pain, no fever, no numbness, no weight loss, no headaches, no abdominal pain, no abdominal swelling, no bowel incontinence, no perianal numbness, no bladder incontinence, no pelvic pain, no leg pain, no paresthesias, no paresis, no tingling and no weakness. She has tried nothing for the symptoms. Risk factors include history of kidney stones.        Past Medical History   Diagnosis Date   ??? Postpartum fever 05/09/2008   ??? Anemia of pregnancy 05/09/2008   ??? Previous cesarean delivery, delivered 05/09/2008   ??? General conditions of the kidney and ureter      kidney stones   ??? General conditions of the kidney and ureter      hydronephrosis   ??? GERD (gastroesophageal reflux disease)      mild   ??? Other ill-defined conditions      kidney stones        Past Surgical History   Procedure Date   ??? Cesarean delivery only 2001, 2006, 2009   ??? Hx other surgical 201, 2006     lithotripsy x2   ??? Hx other surgical 10/11/09     kidney-stent   ??? Hx other surgical      c-section   ??? Hx lithotripsy      x3   ??? Hx tonsillectomy    ??? Hx cesarean section      x4   ??? Hx tubal ligation      Nov 17, 2008         Family History   Problem Relation Age of Onset   ??? Alcohol abuse Neg Hx     ??? Arthritis-rheumatoid Neg Hx    ??? Asthma Neg Hx    ??? Bleeding Prob Neg Hx    ??? Diabetes Neg Hx    ??? Heart Disease Neg Hx    ??? Migraines Neg Hx    ??? Psychiatric Disorder Neg Hx    ??? Stroke Neg Hx    ??? Elevated Lipids Neg Hx    ??? Headache Neg Hx    ??? Hypertension Neg Hx    ??? Lung Disease Neg Hx    ??? Mental Retardation Neg Hx    ??? Cancer Maternal Aunt         History     Social History   ??? Marital Status: Single     Spouse Name: N/A     Number of Children: N/A   ??? Years of Education: N/A     Occupational History   ??? Not on file.     Social History Main Topics   ??? Smoking status: Former Smoker -- 0.2 packs/day  Quit date: 04/08/2007   ??? Smokeless tobacco: Never Used   ??? Alcohol Use: No   ??? Drug Use: No   ??? Sexually Active: No     Other Topics Concern   ??? Not on file     Social History Narrative   ??? No narrative on file                  ALLERGIES: Meperidine      Review of Systems   Constitutional: Negative.  Negative for fever and weight loss.   HENT: Negative.    Eyes: Negative.    Respiratory: Negative.    Cardiovascular: Negative.  Negative for chest pain.   Gastrointestinal: Negative.  Negative for abdominal pain and bowel incontinence.   Genitourinary: Positive for dysuria, hematuria and flank pain. Negative for bladder incontinence and pelvic pain.   Musculoskeletal: Negative.    Skin: Negative.    Neurological: Negative.  Negative for tingling, weakness, numbness, headaches and paresthesias.   Hematological: Negative.    Psychiatric/Behavioral: Negative.    [all other systems reviewed and are negative        Filed Vitals:    11/18/10 1152   BP: 98/49   Pulse: 88   Temp: 98 ??F (36.7 ??C)   Resp: 14   Height: 6' (1.829 m)   Weight: 143 lb (64.864 kg)            Physical Exam   [nursing notereviewed.  Constitutional: She is oriented to person, place, and time. She appears well-developed and well-nourished.   HENT:   Head: Normocephalic and atraumatic.   Right Ear: External ear normal.    Left Ear: External ear normal.   Nose: Nose normal.   Mouth/Throat: Oropharynx is clear and moist.   Eyes: Conjunctivae and EOM are normal. Pupils are equal, round, and reactive to light.   Neck: Normal range of motion. Neck supple.   Cardiovascular: Normal rate, regular rhythm, normal heart sounds and intact distal pulses.    Pulmonary/Chest: Effort normal and breath sounds normal.   Abdominal: Soft. Bowel sounds are normal.   Musculoskeletal: Normal range of motion.        Arms:  Neurological: She is alert and oriented to person, place, and time. She has normal reflexes.   Skin: Skin is warm and dry.   Psychiatric: She has a normal mood and affect. Her behavior is normal. Judgment and thought content normal.        MDM    Procedures    The patient was observed in the ED.    Results Reviewed:      Recent Results (from the past 24 hour(s))   URINE MICROSCOPIC    Collection Time    11/18/10 12:19 PM       Component Value Range    WBC 20-50  0 (/HPF)    RBC 0  0 (/HPF)    Epithelial cells 0-3  0 (/HPF)    Bacteria 3+ (*) 0 (/HPF)    Casts 0  0 (/LPF)    Crystals 0  0 (/LPF)    Mucus 0  0 (/LPF)    Other observations OCCASIONAL RENAL EPITHELIAL     HCG URINE, QL. - POC    Collection Time    11/18/10 12:25 PM       Component Value Range    Pregnancy test,urine (POC) NEGATIVE   NEGATIVE    CBC WITH AUTOMATED DIFF    Collection Time  11/18/10 12:50 PM       Component Value Range    WBC 6.1  4.3 - 11.1 (K/uL)    RBC 4.13  4.05 - 5.25 (M/uL)    HGB 12.7  11.7 - 15.4 (g/dL)    HCT 16.1  09.6 - 04.5 (%)    MCV 92.0  79.6 - 97.8 (FL)    MCH 30.8  26.1 - 32.9 (PG)    MCHC 33.4  31.4 - 35.0 (g/dL)    RDW 40.9  81.1 - 91.4 (%)    PLATELET 280  150 - 450 (K/uL)    MPV 9.5 (*) 10.8 - 14.1 (FL)    DF AUTOMATED      NEUTROPHILS 51  43 - 78 (%)    LYMPHOCYTES 43  13 - 44 (%)    MONOCYTES 5  4.0 - 12.0 (%)    EOSINOPHILS 1  0.5 - 7.8 (%)    BASOPHILS 0  0.0 - 2.0 (%)    IMMATURE GRANULOCYTES 0.0  0.0 - 5.0 (%)     ABS. NEUTROPHILS 3.1  1.7 - 8.2 (K/UL)    ABS. LYMPHOCYTES 2.6  0.5 - 4.6 (K/UL)    ABS. MONOCYTES 0.3  0.1 - 1.3 (K/UL)    ABS. EOSINOPHILS 0.0  0.0 - 0.8 (K/UL)    ABS. BASOPHILS 0.0  0.0 - 0.2 (K/UL)    ABS. IMM. GRANS. 0.0  0.0 - 0.5 (K/UL)   METABOLIC PANEL, BASIC    Collection Time    11/18/10 12:50 PM       Component Value Range    Sodium 139  136 - 145 (MMOL/L)    Potassium 4.5  3.5 - 5.1 (MMOL/L)    Chloride 107  98 - 107 (MMOL/L)    CO2 25  23 - 32 (MMOL/L)    Anion gap 7  7 - 16 (mmol/L)    Glucose 87  65 - 100 (MG/DL)    BUN 10  6 - 23 (MG/DL)    Creatinine 1.0  0.6 - 1.5 (MG/DL)    GFR est AA >78  >29 (ml/min/1.78m2)    GFR est non-AA >60  >60 (ml/min/1.18m2)    Calcium 9.2  8.3 - 10.4 (MG/DL)     Pt is feeling much better following fluids and medication. All questions answered. Will have her follow up as scheduled with Dr. Deloria Lair. ED sooner if any change or worse.  I discussed the results of all labs, procedures, radiographs, and treatments with the patient and available family.  Treatment plan is agreed upon and the patient is ready for discharge.  All voiced understanding of the discharge plan and medication instructions or changes as appropriate.  Questions about treatment in the ED were answered.  All were encouraged to return should symptoms worsen or new problems develop.

## 2010-11-18 NOTE — ED Notes (Signed)
States she is unable to urinate in triage

## 2010-11-19 NOTE — Progress Notes (Signed)
Quick Note:    Patient placed on keflex. ID and susceptibilities to follow.  ______

## 2010-11-20 LAB — CULTURE, URINE
Culture result:: >100000
Culture: >100000

## 2010-11-20 NOTE — Progress Notes (Signed)
Quick Note:    Susceptible to keflex, continue current treatment  ______

## 2010-11-24 NOTE — ED Notes (Signed)
I have retrospectively accessed this chart for auditing purposes only.

## 2011-01-29 MED ORDER — TRIMETHOPRIM-POLYMYXIN B 0.1 %-10,000 UNIT/ML EYE DROPS
10000 unit- 1 mg/mL | OPHTHALMIC | Status: AC
Start: 2011-01-29 — End: 2011-02-08

## 2011-01-29 NOTE — ED Notes (Signed)
I have reviewed discharge instructions with the patient.  The patient verbalized understanding.  Prescriptions given and instructions covered for medications.

## 2011-01-29 NOTE — ED Provider Notes (Signed)
Patient is a 30 y.o. female presenting with conjunctivitis.   Pink Eye   This is a new problem. Episode onset: 3 days ago. The problem has been gradually worsening. There is pain in both eyes. The injury mechanism was none. There is no history of trauma to the eye. There is no known exposure to pink eye. She does not wear contacts. Associated symptoms include blurred vision, discharge, foreign body sensation, eye redness, itching and pain. Pertinent negatives include no double vision, no nausea, no vomiting, no fever and no head injury.        Past Medical History   Diagnosis Date   ??? Postpartum fever 05/09/2008   ??? Anemia of pregnancy 05/09/2008   ??? Previous cesarean delivery, delivered 05/09/2008   ??? General conditions of the kidney and ureter      kidney stones   ??? General conditions of the kidney and ureter      hydronephrosis   ??? GERD (gastroesophageal reflux disease)      mild   ??? Chronic kidney disease      kidney stones        Past Surgical History   Procedure Date   ??? Cesarean delivery only 2001, 2006, 2009   ??? Hx other surgical 201, 2006     lithotripsy x2   ??? Hx other surgical 10/11/09     kidney-stent   ??? Hx other surgical      c-section   ??? Hx lithotripsy      x3   ??? Hx tonsillectomy    ??? Hx cesarean section      x4   ??? Hx tubal ligation      Nov 17, 2008         Family History   Problem Relation Age of Onset   ??? Alcohol abuse Neg Hx    ??? Arthritis-rheumatoid Neg Hx    ??? Asthma Neg Hx    ??? Bleeding Prob Neg Hx    ??? Diabetes Neg Hx    ??? Heart Disease Neg Hx    ??? Migraines Neg Hx    ??? Psychiatric Disorder Neg Hx    ??? Stroke Neg Hx    ??? Elevated Lipids Neg Hx    ??? Headache Neg Hx    ??? Hypertension Neg Hx    ??? Lung Disease Neg Hx    ??? Mental Retardation Neg Hx    ??? Cancer Maternal Aunt         History     Social History   ??? Marital Status: Single     Spouse Name: N/A     Number of Children: N/A   ??? Years of Education: N/A     Occupational History   ??? Not on file.     Social History Main Topics    ??? Smoking status: Current Everyday Smoker -- 0.2 packs/day   ??? Smokeless tobacco: Never Used   ??? Alcohol Use: No   ??? Drug Use: No   ??? Sexually Active: Yes -- Female partner(s)     Birth Control/ Protection: Surgical     Other Topics Concern   ??? Not on file     Social History Narrative   ??? No narrative on file                  ALLERGIES: Meperidine      Review of Systems   Constitutional: Negative for fever, chills and diaphoresis.   HENT: Negative for congestion,  rhinorrhea, trouble swallowing, neck pain, neck stiffness and sinus pressure.    Eyes: Positive for blurred vision, pain, discharge and redness. Negative for double vision.   Respiratory: Negative for cough, shortness of breath and wheezing.    Gastrointestinal: Negative for nausea and vomiting.   Skin: Positive for itching.       Filed Vitals:    01/29/11 1140 01/29/11 1228   BP: 102/57    Pulse: 94    Temp: 98 ??F (36.7 ??C)    Resp: 20    Height: 6' (1.829 m)    Weight: 65.772 kg (145 lb)    SpO2: 99% 99%            Physical Exam   Constitutional: She is oriented to person, place, and time. She appears well-developed and well-nourished. No distress.   HENT:   Head: Normocephalic and atraumatic.   Right Ear: Hearing, tympanic membrane, external ear and ear canal normal.   Left Ear: Hearing, tympanic membrane, external ear and ear canal normal.   Mouth/Throat: Uvula is midline, oropharynx is clear and moist and mucous membranes are normal.   Eyes: EOM and lids are normal. Pupils are equal, round, and reactive to light. Right eye exhibits no discharge and no exudate. No foreign body present in the right eye. Left eye exhibits no discharge and no exudate. No foreign body present in the left eye. Right conjunctiva is injected. Right conjunctiva has no hemorrhage. Left conjunctiva is injected. Left conjunctiva has no hemorrhage.   Cardiovascular: Normal rate.    Pulmonary/Chest: Effort normal.   Neurological: She is alert and oriented to person, place, and time.    Skin: Skin is warm and dry. She is not diaphoretic.   Psychiatric: She has a normal mood and affect. Her behavior is normal.        MDM    Procedures    I have discussed the results of exam with the patient.?? A treatment plan was developed in conjunction with the patient and was agreed upon. The patient is ready for discharge at this time.?? All voiced understanding of the discharge plan and medication instructions or changes as appropriate.?? Questions about treatment in the ED were answered.?? The patient was encouraged to return should symptoms worsen or new problems develop. A follow up physician was provided to the patient on the discharge papers.

## 2011-01-29 NOTE — ED Notes (Signed)
Both eyes red and itching.

## 2011-01-30 NOTE — ED Notes (Signed)
Pt. States that she had unprotected sex with a new partner last week.  She states that he was seen here in the ED today and diagnosed with trichimonas.  Pt. States that she has had vaginal discharge x2days.

## 2011-01-30 NOTE — ED Provider Notes (Signed)
HPI Comments: Patient states that she recently has sex with a new partner and he was seen here today and diagnosed with Trichomoniasis. She states that she began having a white vaginal discharge yesterday.    Patient is a 30 y.o. female presenting with vaginal discharge. The history is provided by the patient.   Vaginal Discharge   This is a new problem. The current episode started 2 days ago. The problem has been gradually worsening. The discharge occurs spontaneously. The discharge was white and thin. She is not pregnant. Associated symptoms include abdominal pain. Pertinent negatives include no anorexia, no diaphoresis, no fever, no abdominal swelling, no constipation, no diarrhea, no nausea, no vomiting, no dyspareunia, no dysuria, no frequency, no genital burning, no genital itching, no genital lesions, no perineal pain, no perineal odor and no painful intercourse. She has tried nothing for the symptoms.        Past Medical History   Diagnosis Date   ??? Postpartum fever 05/09/2008   ??? Anemia of pregnancy 05/09/2008   ??? Previous cesarean delivery, delivered 05/09/2008   ??? General conditions of the kidney and ureter      kidney stones   ??? General conditions of the kidney and ureter      hydronephrosis   ??? GERD (gastroesophageal reflux disease)      mild   ??? Chronic kidney disease      kidney stones        Past Surgical History   Procedure Date   ??? Cesarean delivery only 2001, 2006, 2009   ??? Hx other surgical 201, 2006     lithotripsy x2   ??? Hx other surgical 10/11/09     kidney-stent   ??? Hx other surgical      c-section   ??? Hx lithotripsy      x3   ??? Hx tonsillectomy    ??? Hx cesarean section      x4   ??? Hx tubal ligation      Nov 17, 2008         Family History   Problem Relation Age of Onset   ??? Alcohol abuse Neg Hx    ??? Arthritis-rheumatoid Neg Hx    ??? Asthma Neg Hx    ??? Bleeding Prob Neg Hx    ??? Diabetes Neg Hx    ??? Heart Disease Neg Hx    ??? Migraines Neg Hx    ??? Psychiatric Disorder Neg Hx    ??? Stroke Neg Hx     ??? Elevated Lipids Neg Hx    ??? Headache Neg Hx    ??? Hypertension Neg Hx    ??? Lung Disease Neg Hx    ??? Mental Retardation Neg Hx    ??? Cancer Maternal Aunt         History     Social History   ??? Marital Status: Single     Spouse Name: N/A     Number of Children: N/A   ??? Years of Education: N/A     Occupational History   ??? Not on file.     Social History Main Topics   ??? Smoking status: Current Everyday Smoker -- 0.2 packs/day   ??? Smokeless tobacco: Never Used   ??? Alcohol Use: No   ??? Drug Use: No   ??? Sexually Active: Yes -- Female partner(s)     Birth Control/ Protection: Surgical     Other Topics Concern   ??? Not on file  Social History Narrative   ??? No narrative on file                  ALLERGIES: Meperidine      Review of Systems   Constitutional: Negative for fever, chills and diaphoresis.   HENT: Negative for neck pain and neck stiffness.    Respiratory: Negative for cough and shortness of breath.    Gastrointestinal: Positive for abdominal pain. Negative for nausea, vomiting, diarrhea, constipation and anorexia.   Genitourinary: Positive for vaginal discharge. Negative for dysuria, frequency and dyspareunia.   Skin: Negative for rash and wound.   Neurological: Negative for headaches.   All other systems reviewed and are negative.        Filed Vitals:    01/30/11 1826 01/30/11 2046   BP: 108/72 106/50   Pulse: 98 78   Temp: 98.4 ??F (36.9 ??C)    Resp: 18 18   Height: 6' (1.829 m)    Weight: 65.772 kg (145 lb)    SpO2: 98% 99%            Physical Exam   Constitutional: She is oriented to person, place, and time. She appears well-developed and well-nourished.   HENT:   Head: Normocephalic and atraumatic.   Right Ear: External ear normal.   Left Ear: External ear normal.   Nose: Nose normal.   Mouth/Throat: Oropharynx is clear and moist.   Eyes: Conjunctivae and EOM are normal. Pupils are equal, round, and reactive to light.   Neck: Normal range of motion. Neck supple.    Cardiovascular: Normal rate, regular rhythm and normal heart sounds.    Pulmonary/Chest: Effort normal and breath sounds normal.   Abdominal: Soft. Bowel sounds are normal. There is tenderness.   Musculoskeletal: Normal range of motion.   Neurological: She is alert and oriented to person, place, and time.   Skin: Skin is warm and dry.   Psychiatric: She has a normal mood and affect. Her behavior is normal.        MDM     Differential Diagnosis; Clinical Impression; Plan:     UTI, Vaginitis, Nospecific      Amount and/or Complexity of Data Reviewed:   Tests in the radiology section of CPT??:  Ordered and reviewed      Procedures

## 2011-01-30 NOTE — ED Notes (Signed)
Pt seen here yesterday for something else

## 2011-01-30 NOTE — ED Notes (Signed)
I have reviewed discharge instructions with the patient.  The patient verbalized understanding.  Pt. Left ambulatory with prescriptions.

## 2011-01-31 LAB — URINE MICROSCOPIC
Casts: 0 /LPF
Crystals, urine: 0 /LPF

## 2011-01-31 MED ORDER — LIDOCAINE HCL 1 % (10 MG/ML) IJ SOLN
10 mg/mL (1 %) | INTRAMUSCULAR | Status: DC
Start: 2011-01-31 — End: 2011-01-31
  Administered 2011-01-31: 02:00:00 via INTRAMUSCULAR

## 2011-01-31 MED ORDER — DOXYCYCLINE 100 MG CAP
100 mg | ORAL_CAPSULE | Freq: Two times a day (BID) | ORAL | Status: AC
Start: 2011-01-31 — End: 2011-02-06

## 2011-01-31 MED ORDER — METRONIDAZOLE 500 MG TAB
500 mg | ORAL_TABLET | Freq: Two times a day (BID) | ORAL | Status: AC
Start: 2011-01-31 — End: 2011-02-06

## 2011-02-19 MED ORDER — INDOMETHACIN 25 MG CAP
25 mg | ORAL_CAPSULE | Freq: Three times a day (TID) | ORAL | Status: DC
Start: 2011-02-19 — End: 2011-03-02

## 2011-02-19 MED ORDER — HYDROCODONE-ACETAMINOPHEN 7.5 MG-325 MG TAB
ORAL | Status: AC
Start: 2011-02-19 — End: 2011-02-19
  Administered 2011-02-19: 18:00:00 via ORAL

## 2011-02-19 MED ORDER — HYDROCODONE-ACETAMINOPHEN 5 MG-500 MG TAB
5-500 mg | ORAL_TABLET | Freq: Four times a day (QID) | ORAL | Status: DC | PRN
Start: 2011-02-19 — End: 2011-03-23

## 2011-02-19 NOTE — ED Notes (Signed)
Patient's Medications   Start Taking    HYDROCODONE-ACETAMINOPHEN (LORTAB) 5-500 MG PER TABLET    Take 1 Tab by mouth every six (6) hours as needed.    INDOMETHACIN (INDOCIN) 25 MG CAPSULE    Take 1 Cap by mouth three (3) times daily for 14 days. With food   Continue Taking    No medications on file   These Medications have changed    No medications on file   Stop Taking    No medications on file

## 2011-02-19 NOTE — ED Provider Notes (Signed)
HPI Comments: Patient c/o pain, numbness and tingling of left arm and hand x 3wks that is worsening, atraumatic in onset        Patient is a 30 y.o. female presenting with hand pain. The history is provided by the patient.   Hand Pain   This is a new problem. Episode onset: 3 wks. The problem occurs constantly. The problem has been gradually worsening. The pain is present in the left hand and left arm. The pain is severe. Associated symptoms include numbness, limited range of motion, stiffness and tingling. She has tried OTC pain medications for the symptoms. The treatment provided no relief.        Past Medical History   Diagnosis Date   ??? Postpartum fever 05/09/2008   ??? Anemia of pregnancy 05/09/2008   ??? General conditions of the kidney and ureter      kidney stones   ??? General conditions of the kidney and ureter      hydronephrosis   ??? GERD (gastroesophageal reflux disease)      mild   ??? Previous cesarean delivery, delivered 05/09/2008   ??? Chronic kidney disease      kidney stones        Past Surgical History   Procedure Date   ??? Pr cesarean delivery only 2001, 2006, 2009   ??? Hx other surgical 201, 2006     lithotripsy x2   ??? Hx other surgical 10/11/09     kidney-stent   ??? Hx other surgical      c-section   ??? Hx lithotripsy      x3   ??? Hx tonsillectomy    ??? Hx cesarean section      x4   ??? Hx tubal ligation      Nov 17, 2008         Family History   Problem Relation Age of Onset   ??? Alcohol abuse Neg Hx    ??? Arthritis-rheumatoid Neg Hx    ??? Asthma Neg Hx    ??? Bleeding Prob Neg Hx    ??? Diabetes Neg Hx    ??? Heart Disease Neg Hx    ??? Migraines Neg Hx    ??? Psychiatric Disorder Neg Hx    ??? Stroke Neg Hx    ??? Elevated Lipids Neg Hx    ??? Headache Neg Hx    ??? Hypertension Neg Hx    ??? Lung Disease Neg Hx    ??? Mental Retardation Neg Hx    ??? Cancer Maternal Aunt         History     Social History   ??? Marital Status: Single     Spouse Name: N/A     Number of Children: N/A   ??? Years of Education: N/A     Occupational History    ??? Not on file.     Social History Main Topics   ??? Smoking status: Current Everyday Smoker -- 0.2 packs/day   ??? Smokeless tobacco: Never Used   ??? Alcohol Use: No   ??? Drug Use: No   ??? Sexually Active: Yes -- Female partner(s)     Birth Control/ Protection: Surgical     Other Topics Concern   ??? Not on file     Social History Narrative   ??? No narrative on file                  ALLERGIES: Meperidine      Review  of Systems   Constitutional: Negative for fever, chills and diaphoresis.   Musculoskeletal: Positive for myalgias, arthralgias and stiffness.   Skin: Negative for color change and wound.   Neurological: Positive for tingling and numbness.       Filed Vitals:    02/19/11 1329 02/19/11 1339   BP: 122/59    Pulse: 71    Temp: 98.2 ??F (36.8 ??C)    Resp: 18    Height: 6' (1.829 m)    Weight: 65.318 kg (144 lb)    SpO2: 100% 99%            Physical Exam   Constitutional: She is oriented to person, place, and time. She appears well-developed and well-nourished. No distress.   HENT:   Head: Normocephalic and atraumatic.   Eyes: Conjunctivae are normal. Pupils are equal, round, and reactive to light.   Cardiovascular: Normal rate.    Pulmonary/Chest: Effort normal.   Musculoskeletal:        Left wrist: She exhibits tenderness. She exhibits normal range of motion, no bony tenderness, no swelling, no effusion, no crepitus, no deformity and no laceration.   Neurological: She is alert and oriented to person, place, and time.   Skin: Skin is warm and dry. She is not diaphoretic.   Psychiatric: She has a normal mood and affect. Her behavior is normal.        MDM    Procedures     I have discussed the exam with the patient and available family.?? A treatment plan was developed in conjunction with the patient and was agreed upon. The patient is ready for discharge at this time.?? All voiced understanding of the discharge plan and medication instructions or changes as appropriate.?? Questions about treatment in the ED were answered.?? The patient was encouraged to return should symptoms worsen or new problems develop. A follow up physician was provided to the patient on the discharge papers.

## 2011-02-19 NOTE — ED Notes (Signed)
I have reviewed discharge instructions with the patient.  The patient verbalized understanding.

## 2011-02-19 NOTE — ED Notes (Signed)
Pt. Hurt her left arm lifting her child with her arm, feeling numb.

## 2011-02-19 NOTE — ED Notes (Signed)
Splint applied and care explained Pt. Feeling better and ready to discharge.

## 2011-02-19 NOTE — ED Notes (Signed)
Patient's Medications   Start Taking    HYDROCODONE-ACETAMINOPHEN (LORTAB) 5-500 MG PER TABLET    Take 1 Tab by mouth every six (6) hours as needed.    INDOMETHACIN (INDOCIN) 25 MG CAPSULE    Take 1 Cap by mouth three (3) times daily for 14 days. With food   Continue Taking    No medications on file   These Medications have changed    No medications on file   Stop Taking    No medications on file

## 2011-03-02 LAB — URINE MICROSCOPIC
Bacteria: 0 /HPF
Casts: 0 /LPF
Crystals, urine: 0 /LPF
Epithelial cells: 0 /HPF

## 2011-03-02 LAB — HCG URINE, QL. - POC: Pregnancy test,urine (POC): NEGATIVE

## 2011-03-02 MED ORDER — KETOROLAC TROMETHAMINE 60 MG/2 ML IM
60 mg/2 mL | INTRAMUSCULAR | Status: AC
Start: 2011-03-02 — End: 2011-03-02
  Administered 2011-03-03: via INTRAMUSCULAR

## 2011-03-02 MED ORDER — PROMETHAZINE 25 MG/ML INJECTION
25 mg/mL | INTRAMUSCULAR | Status: AC
Start: 2011-03-02 — End: 2011-03-02
  Administered 2011-03-03: via INTRAMUSCULAR

## 2011-03-02 NOTE — ED Notes (Addendum)
I have reviewed discharge instructions with the patient.  The patient verbalized understanding. Pt ambulatory to car. Pt home with family.

## 2011-03-02 NOTE — ED Provider Notes (Signed)
Patient is a 30 y.o. female presenting with flank pain. The history is provided by the patient.   Flank Pain   This is a new problem. The current episode started more than 2 days ago. The problem has not changed since onset.The problem occurs constantly. Associated with: Nausea and vomiting  - hx of kidney stones. The pain is present in the right side. The quality of the pain is described as stabbing, shooting and similar to previous episodes. Radiates to: to right lower abdomen. The pain is mild. Exacerbated by: Urinating. Associated symptoms include dysuria and pelvic pain. Pertinent negatives include no chest pain, no fever and no abdominal pain. Treatments tried: Pt is on Lortab for tendonitis. The treatment provided mild relief. Risk factors include history of kidney stones.        Past Medical History   Diagnosis Date   ??? Postpartum fever 05/09/2008   ??? Anemia of pregnancy 05/09/2008   ??? General conditions of the kidney and ureter      kidney stones   ??? General conditions of the kidney and ureter      hydronephrosis   ??? GERD (gastroesophageal reflux disease)      mild   ??? Previous cesarean delivery, delivered 05/09/2008   ??? Chronic kidney disease      kidney stones        Past Surgical History   Procedure Date   ??? Pr cesarean delivery only 2001, 2006, 2009   ??? Hx other surgical 201, 2006     lithotripsy x2   ??? Hx other surgical 10/11/09     kidney-stent   ??? Hx other surgical      c-section   ??? Hx lithotripsy      x3   ??? Hx tonsillectomy    ??? Hx cesarean section      x4   ??? Hx tubal ligation      Nov 17, 2008         Family History   Problem Relation Age of Onset   ??? Alcohol abuse Neg Hx    ??? Arthritis-rheumatoid Neg Hx    ??? Asthma Neg Hx    ??? Bleeding Prob Neg Hx    ??? Diabetes Neg Hx    ??? Heart Disease Neg Hx    ??? Migraines Neg Hx    ??? Psychiatric Disorder Neg Hx    ??? Stroke Neg Hx    ??? Elevated Lipids Neg Hx    ??? Headache Neg Hx    ??? Hypertension Neg Hx    ??? Lung Disease Neg Hx    ??? Mental Retardation Neg Hx     ??? Cancer Maternal Aunt         History     Social History   ??? Marital Status: Single     Spouse Name: N/A     Number of Children: N/A   ??? Years of Education: N/A     Occupational History   ??? Not on file.     Social History Main Topics   ??? Smoking status: Current Everyday Smoker -- 0.2 packs/day   ??? Smokeless tobacco: Never Used   ??? Alcohol Use: No   ??? Drug Use: No   ??? Sexually Active: Yes -- Female partner(s)     Birth Control/ Protection: Surgical     Other Topics Concern   ??? Not on file     Social History Narrative   ??? No narrative on file  ALLERGIES: Meperidine      Review of Systems   Constitutional: Negative for fever.   HENT: Negative for neck pain.    Respiratory: Negative for shortness of breath.    Cardiovascular: Negative for chest pain.   Gastrointestinal: Negative for nausea, vomiting, abdominal pain and diarrhea.   Genitourinary: Positive for dysuria, flank pain and pelvic pain.   Musculoskeletal: Positive for back pain.        Right flank pain   Skin: Negative for rash.   All other systems reviewed and are negative.        Filed Vitals:    03/02/11 1911   BP: 115/53   Pulse: 88   Temp: 98.6 ??F (37 ??C)   Resp: 18   Height: 6' (1.829 m)   Weight: 65.318 kg (144 lb)   SpO2: 100%            Physical Exam   Nursing note and vitals reviewed.  Constitutional: She is oriented to person, place, and time. She appears well-developed and well-nourished.   HENT:   Head: Normocephalic and atraumatic.   Eyes: EOM are normal.   Neck: Normal range of motion.   Cardiovascular: Normal rate and regular rhythm.    Pulmonary/Chest: Breath sounds normal.   Abdominal: Soft. Bowel sounds are normal. There is tenderness. There is no rebound and no guarding.        + right CVAT   Musculoskeletal: Normal range of motion. She exhibits no tenderness.   Neurological: She is alert and oriented to person, place, and time.   Skin: Skin is warm.        MDM     Differential Diagnosis; Clinical Impression; Plan:      Flank pain/ UTI/ Renal Colic/ Kidney Stone  Amount and/or Complexity of Data Reviewed:   Clinical lab tests:  Ordered and reviewed  Tests in the radiology section of CPT??:  Ordered and reviewed      Procedures      Discussed with pt all results and need for follow up. Pt subjectively improved at this time and requesting Lortab 10.

## 2011-03-02 NOTE — ED Notes (Signed)
Pt states her pain is better.

## 2011-03-03 MED ORDER — CIPROFLOXACIN 500 MG TAB
500 mg | ORAL_TABLET | Freq: Two times a day (BID) | ORAL | Status: AC
Start: 2011-03-03 — End: 2011-03-05

## 2011-03-03 MED ORDER — HYDROCODONE-ACETAMINOPHEN 10 MG-500 MG TAB
10-500 mg | ORAL_TABLET | Freq: Four times a day (QID) | ORAL | Status: DC | PRN
Start: 2011-03-03 — End: 2011-03-23

## 2011-03-03 MED ORDER — ONDANSETRON HCL 4 MG TAB
4 mg | ORAL_TABLET | Freq: Three times a day (TID) | ORAL | Status: DC | PRN
Start: 2011-03-03 — End: 2011-03-23

## 2011-03-23 LAB — HCG URINE, QL. - POC: Pregnancy test,urine (POC): NEGATIVE

## 2011-03-23 LAB — URINE MICROSCOPIC
Casts: 0 /LPF
Crystals, urine: 0 /LPF

## 2011-03-23 MED ORDER — KETOROLAC TROMETHAMINE 10 MG TAB
10 mg | ORAL_TABLET | Freq: Four times a day (QID) | ORAL | Status: DC | PRN
Start: 2011-03-23 — End: 2011-04-17

## 2011-03-23 MED ORDER — CEPHALEXIN 500 MG CAP
500 mg | ORAL_CAPSULE | Freq: Four times a day (QID) | ORAL | Status: AC
Start: 2011-03-23 — End: 2011-04-02

## 2011-03-23 MED ORDER — PHENAZOPYRIDINE 200 MG TAB
200 mg | ORAL_TABLET | Freq: Three times a day (TID) | ORAL | Status: AC
Start: 2011-03-23 — End: 2011-03-25

## 2011-03-23 NOTE — ED Notes (Signed)
I have reviewed discharge instructions with the patient.  The patient verbalized understanding.

## 2011-03-23 NOTE — ED Provider Notes (Signed)
HPI Comments: 30 yo female c/o hematuria and dysuria for the past couple of days. Patient states that she was recently seen for kidney stones and thinks she might have passed one. States she just finished her cipro but is having bleeding and pain with urination as well as bilateral flank pain. Patient denies F/N/V. Patient in NAD on exam.       Pmhx: kidney stones, GERD, CKD  Shx: 1/4 ppd     Patient is a 31 y.o. female presenting with hematuria. The history is provided by the patient.   Blood in Urine   This is a recurrent problem. The current episode started more than 2 days ago. The problem occurs every urination. The problem has not changed since onset.The quality of the pain is described as burning. The pain is mild. There has been no fever. Associated symptoms include hematuria and flank pain. Pertinent negatives include no chills, no sweats, no nausea, no vomiting, no discharge, no frequency, no hesitancy, no urgency, no vaginal discharge, no abdominal pain and no back pain. It is unknown if the patient is pregnant.She has tried antibiotics for the symptoms. The treatment provided no relief. Her past medical history is significant for kidney stones.        Past Medical History   Diagnosis Date   ??? Postpartum fever 05/09/2008   ??? Anemia of pregnancy 05/09/2008   ??? General conditions of the kidney and ureter      kidney stones   ??? General conditions of the kidney and ureter      hydronephrosis   ??? GERD (gastroesophageal reflux disease)      mild   ??? Previous cesarean delivery, delivered 05/09/2008   ??? Chronic kidney disease      kidney stones        Past Surgical History   Procedure Date   ??? Pr cesarean delivery only 2001, 2006, 2009   ??? Hx other surgical 201, 2006     lithotripsy x2   ??? Hx other surgical 10/11/09     kidney-stent   ??? Hx other surgical      c-section   ??? Hx lithotripsy      x3   ??? Hx tonsillectomy    ??? Hx cesarean section      x4   ??? Hx tubal ligation      Nov 17, 2008         Family History    Problem Relation Age of Onset   ??? Alcohol abuse Neg Hx    ??? Arthritis-rheumatoid Neg Hx    ??? Asthma Neg Hx    ??? Bleeding Prob Neg Hx    ??? Diabetes Neg Hx    ??? Heart Disease Neg Hx    ??? Migraines Neg Hx    ??? Psychiatric Disorder Neg Hx    ??? Stroke Neg Hx    ??? Elevated Lipids Neg Hx    ??? Headache Neg Hx    ??? Hypertension Neg Hx    ??? Lung Disease Neg Hx    ??? Mental Retardation Neg Hx    ??? Cancer Maternal Aunt         History     Social History   ??? Marital Status: Single     Spouse Name: N/A     Number of Children: N/A   ??? Years of Education: N/A     Occupational History   ??? Not on file.     Social History Main Topics   ???  Smoking status: Current Everyday Smoker -- 0.2 packs/day   ??? Smokeless tobacco: Never Used   ??? Alcohol Use: No   ??? Drug Use: No   ??? Sexually Active: Yes -- Female partner(s)     Birth Control/ Protection: Surgical     Other Topics Concern   ??? Not on file     Social History Narrative   ??? No narrative on file                  ALLERGIES: Meperidine      Review of Systems   Constitutional: Negative for fever, chills, diaphoresis, activity change, appetite change, fatigue and unexpected weight change.   Respiratory: Negative for apnea, cough, choking, chest tightness, shortness of breath, wheezing and stridor.    Cardiovascular: Negative for chest pain, palpitations and leg swelling.   Gastrointestinal: Negative for nausea, vomiting, abdominal pain, diarrhea, constipation and blood in stool.   Genitourinary: Positive for dysuria, hematuria and flank pain. Negative for hesitancy, urgency, frequency, vaginal discharge, difficulty urinating, vaginal pain, menstrual problem and pelvic pain.   Musculoskeletal: Negative for myalgias, back pain, joint swelling, arthralgias and gait problem.   Skin: Negative for color change, pallor, rash and wound.   Psychiatric/Behavioral: Negative for behavioral problems, confusion and agitation.       Filed Vitals:    03/23/11 1225   BP: 98/48   Pulse: 92    Temp: 97.7 ??F (36.5 ??C)   Resp: 18   Height: 6' (1.829 m)   Weight: 65.772 kg (145 lb)            Physical Exam   Nursing note and vitals reviewed.  Constitutional: She is oriented to person, place, and time. Vital signs are normal. She appears well-developed and well-nourished.  Non-toxic appearance. She does not have a sickly appearance. She does not appear ill. No distress.   HENT:   Head: Normocephalic and atraumatic.   Right Ear: External ear normal.   Left Ear: External ear normal.   Eyes: Conjunctivae and EOM are normal. Pupils are equal, round, and reactive to light. Right eye exhibits no discharge. Left eye exhibits no discharge. No scleral icterus.   Neck: Normal range of motion. Neck supple.   Cardiovascular: Normal rate, regular rhythm, S1 normal, S2 normal and normal heart sounds.  Exam reveals no gallop.    No murmur heard.  Pulmonary/Chest: Effort normal and breath sounds normal. No respiratory distress. She has no decreased breath sounds. She has no wheezes. She has no rhonchi. She has no rales.   Abdominal: Soft. Normal appearance and bowel sounds are normal. She exhibits no distension and no mass. There is tenderness in the suprapubic area. There is CVA tenderness (bilateral). There is no rigidity, no rebound, no guarding, no tenderness at McBurney's point and negative Murphy's sign.   Neurological: She is alert and oriented to person, place, and time.   Skin: Skin is warm, dry and intact. No abrasion, no bruising, no burn, no ecchymosis, no laceration, no lesion and no rash noted. She is not diaphoretic. No erythema. No pallor.   Psychiatric: She has a normal mood and affect. Her speech is normal and behavior is normal. Judgment and thought content normal.        MDM     Amount and/or Complexity of Data Reviewed:   Clinical lab tests:  Ordered and reviewed  Tests in the medicine section of the CPT??:  Ordered and reviewed  Risk of Significant Complications, Morbidity, and/or  Mortality:    Presenting problems:  Low  Diagnostic procedures:  Low  Management options:  Low      Procedures    Results Include:    Recent Results (from the past 24 hour(s))   URINE MICROSCOPIC    Collection Time    03/23/11 12:30 PM       Component Value Range    WBC 3-5  0 (/HPF)    RBC 20-50  0 (/HPF)    Epithelial cells 0-3  0 (/HPF)    Bacteria 2+ (*) 0 (/HPF)    Casts 0  0 (/LPF)    Crystals 0  0 (/LPF)    Mucus 3+ (*) 0 (/LPF)   HCG URINE, QL. - POC    Collection Time    03/23/11  1:02 PM       Component Value Range    Pregnancy test,urine (POC) NEGATIVE   NEGATIVE        I have discussed the results of labs, procedures, radiographs, treatments as well as any previous results found within the [facility] with the patient and available family.?? A treatment plan was developed in conjunction with the patient and was agreed upon. The patient is ready for discharge at this time.?? All voiced understanding of the discharge plan and medication instructions or changes as appropriate.?? Questions about treatment in the ED were answered.?? The patient was encouraged to return should symptoms worsen or new problems develop. A follow up physician was provided to the patient on the discharge papers.

## 2011-04-13 LAB — URINALYSIS, ROUTINE W REFLEX MICROSCOPIC
Hgb urine dipstick: NEGATIVE
Specific Gravity, Urine: 1.021
Urobilinogen, UA: 0.2

## 2011-04-13 LAB — URINE MICROSCOPIC-ADD ON

## 2011-04-13 LAB — URINE CULTURE

## 2011-04-17 NOTE — ED Notes (Signed)
Not in waiting room when called.

## 2011-04-18 LAB — URINE MICROSCOPIC
Casts: 0 /LPF
Crystals, urine: 0 /LPF

## 2011-04-18 LAB — HCG URINE, QL. - POC: Pregnancy test,urine (POC): NEGATIVE

## 2011-04-18 MED ORDER — KETOROLAC TROMETHAMINE 10 MG TAB
10 mg | ORAL_TABLET | Freq: Three times a day (TID) | ORAL | Status: AC
Start: 2011-04-18 — End: 2011-04-23

## 2011-04-18 MED ORDER — CEPHALEXIN 500 MG CAP
500 mg | ORAL | Status: AC
Start: 2011-04-18 — End: 2011-04-18
  Administered 2011-04-18: 17:00:00 via ORAL

## 2011-04-18 MED ORDER — KETOROLAC TROMETHAMINE 10 MG TAB
10 mg | ORAL | Status: AC
Start: 2011-04-18 — End: 2011-04-18
  Administered 2011-04-18: 17:00:00 via ORAL

## 2011-04-18 MED ORDER — CEPHALEXIN 500 MG CAP
500 mg | ORAL_CAPSULE | Freq: Four times a day (QID) | ORAL | Status: AC
Start: 2011-04-18 — End: 2011-04-25

## 2011-04-18 NOTE — ED Notes (Signed)
I have reviewed discharge instructions with the patient.  The patient verbalized understanding.

## 2011-04-18 NOTE — ED Provider Notes (Signed)
I personally evaluated and examined the patient in conjunction with the MLP and agree  with the assessment, treatment plan, and disposition of the patient as recorded by the MLP

## 2011-04-18 NOTE — ED Notes (Signed)
Called several times. Not in waiting room.

## 2011-04-18 NOTE — ED Provider Notes (Signed)
HPI Comments: Pt here last night but did not stay, has appt with urology Oct. 10th ,    Patient is a 30 y.o. female presenting with hematuria. The history is provided by the patient.   Blood in Urine   This is a recurrent problem. The current episode started yesterday. The problem occurs every urination. The problem has not changed since onset.The quality of the pain is described as aching. The pain is at a severity of 10/10. The pain is moderate. There has been no fever. She is sexually active. Associated symptoms include hematuria and back pain. The patient is not pregnant.She has tried nothing for the symptoms. The treatment provided no relief. Her past medical history is significant for kidney stones and recurrent UTIs.        Past Medical History   Diagnosis Date   ??? Postpartum fever 05/09/2008   ??? Anemia of pregnancy 05/09/2008   ??? General conditions of the kidney and ureter      kidney stones   ??? General conditions of the kidney and ureter      hydronephrosis   ??? GERD (gastroesophageal reflux disease)      mild   ??? Previous cesarean delivery, delivered 05/09/2008   ??? Chronic kidney disease      kidney stones        Past Surgical History   Procedure Date   ??? Pr cesarean delivery only 2001, 2006, 2009   ??? Hx other surgical 201, 2006     lithotripsy x2   ??? Hx other surgical 10/11/09     kidney-stent   ??? Hx other surgical      c-section   ??? Hx lithotripsy      x3   ??? Hx tonsillectomy    ??? Hx cesarean section      x4   ??? Hx tubal ligation      Nov 17, 2008         Family History   Problem Relation Age of Onset   ??? Alcohol abuse Neg Hx    ??? Arthritis-rheumatoid Neg Hx    ??? Asthma Neg Hx    ??? Bleeding Prob Neg Hx    ??? Diabetes Neg Hx    ??? Heart Disease Neg Hx    ??? Migraines Neg Hx    ??? Psychiatric Disorder Neg Hx    ??? Stroke Neg Hx    ??? Elevated Lipids Neg Hx    ??? Headache Neg Hx    ??? Hypertension Neg Hx    ??? Lung Disease Neg Hx    ??? Mental Retardation Neg Hx    ??? Cancer Maternal Aunt         History      Social History   ??? Marital Status: Single     Spouse Name: N/A     Number of Children: N/A   ??? Years of Education: N/A     Occupational History   ??? Not on file.     Social History Main Topics   ??? Smoking status: Never Smoker    ??? Smokeless tobacco: Never Used   ??? Alcohol Use: No   ??? Drug Use: No   ??? Sexually Active: Yes -- Female partner(s)     Birth Control/ Protection: Surgical     Other Topics Concern   ??? Not on file     Social History Narrative   ??? No narrative on file  ALLERGIES: Meperidine      Review of Systems   Genitourinary: Positive for hematuria.   Musculoskeletal: Positive for back pain.   All other systems reviewed and are negative.        Filed Vitals:    04/18/11 1159   BP: 116/56   Pulse: 68   Temp: 98.8 ??F (37.1 ??C)   Resp: 16            Physical Exam   Nursing note and vitals reviewed.  Constitutional: She is oriented to person, place, and time. She appears well-developed and well-nourished. No distress.   HENT:   Head: Normocephalic and atraumatic.   Eyes: Conjunctivae and EOM are normal. Pupils are equal, round, and reactive to light.   Neck: Normal range of motion. Neck supple.   Cardiovascular: Normal rate and regular rhythm.    Pulmonary/Chest: Effort normal and breath sounds normal. No respiratory distress. She has no wheezes.   Abdominal: Soft. Bowel sounds are normal. There is tenderness.        Suprapubic area   Musculoskeletal: She exhibits tenderness. She exhibits no edema.        Pain across lower back, both sides   Neurological: She is alert and oriented to person, place, and time.   Skin: Skin is warm.        MDM     Differential Diagnosis; Clinical Impression; Plan:     Clean cath with few red cells and +1 bacteria , will treat   Amount and/or Complexity of Data Reviewed:   Clinical lab tests:  Ordered and reviewed   Review and summarize past medical records:  Yes  Risk of Significant Complications, Morbidity, and/or Mortality:   Presenting problems:  Low   Diagnostic procedures:  Low  Management options:  Low  Progress:   Patient progress:  Improved and stable      Procedures

## 2011-07-05 LAB — HCG URINE, QL. - POC: Pregnancy test,urine (POC): NEGATIVE

## 2011-07-05 LAB — URINE MICROSCOPIC
Casts: 0 /LPF
Crystals, urine: 0 /LPF

## 2011-07-05 NOTE — ED Notes (Signed)
Stitches removed. Pt tolerated well.

## 2011-07-05 NOTE — ED Notes (Signed)
Report rcvd from Dawn RN.

## 2011-07-05 NOTE — ED Notes (Signed)
Urologist-Dr Gerarda Fraction.

## 2011-07-05 NOTE — ED Notes (Signed)
Unable to locate.

## 2011-07-05 NOTE — ED Provider Notes (Addendum)
Patient is a 30 y.o. female presenting with medication refill. The history is provided by the patient.   Medication Refill  This is a new (she is here in ED today for suture removal, sutures placed 10 days ago to right upper arm at [facility] ED. She, also, c/o her appt was cancelled today with her Urologist and she has no more pain meds. She has hx multiple kidney stones and sees Dr. Gerarda Fraction) problem. Episode frequency: intermittent bilateral flank pain. Pertinent negatives include no chest pain, no abdominal pain, no headaches and no shortness of breath. Nothing aggravates the symptoms. Nothing relieves the symptoms.        Past Medical History   Diagnosis Date   ??? Postpartum fever 05/09/2008   ??? Anemia of pregnancy 05/09/2008   ??? General conditions of the kidney and ureter      kidney stones   ??? General conditions of the kidney and ureter      hydronephrosis   ??? GERD (gastroesophageal reflux disease)      mild   ??? Previous cesarean delivery, delivered 05/09/2008   ??? Chronic kidney disease      kidney stones        Past Surgical History   Procedure Date   ??? Pr cesarean delivery only 2001, 2006, 2009   ??? Hx other surgical 201, 2006     lithotripsy x2   ??? Hx other surgical 10/11/09     kidney-stent   ??? Hx other surgical      c-section   ??? Hx lithotripsy      x3   ??? Hx tonsillectomy    ??? Hx cesarean section      x4   ??? Hx tubal ligation      Nov 17, 2008         Family History   Problem Relation Age of Onset   ??? Alcohol abuse Neg Hx    ??? Arthritis-rheumatoid Neg Hx    ??? Asthma Neg Hx    ??? Bleeding Prob Neg Hx    ??? Diabetes Neg Hx    ??? Heart Disease Neg Hx    ??? Migraines Neg Hx    ??? Psychiatric Disorder Neg Hx    ??? Stroke Neg Hx    ??? Elevated Lipids Neg Hx    ??? Headache Neg Hx    ??? Hypertension Neg Hx    ??? Lung Disease Neg Hx    ??? Mental Retardation Neg Hx    ??? Cancer Maternal Aunt         History     Social History   ??? Marital Status: Single     Spouse Name: N/A     Number of Children: N/A   ??? Years of Education: N/A      Occupational History   ??? Not on file.     Social History Main Topics   ??? Smoking status: Never Smoker    ??? Smokeless tobacco: Never Used   ??? Alcohol Use: No   ??? Drug Use: No   ??? Sexually Active: Yes -- Female partner(s)     Birth Control/ Protection: Surgical     Other Topics Concern   ??? Not on file     Social History Narrative   ??? No narrative on file                  ALLERGIES: Meperidine      Review of Systems   Constitutional: Negative.  Negative for fever and chills.   HENT: Negative.  Negative for ear pain, congestion, sore throat, rhinorrhea, sneezing, drooling, mouth sores, trouble swallowing, neck pain, neck stiffness, voice change, postnasal drip and sinus pressure.    Eyes: Negative.  Negative for pain and redness.   Respiratory: Negative.  Negative for cough, chest tightness and shortness of breath.    Cardiovascular: Negative.  Negative for chest pain, palpitations and leg swelling.   Gastrointestinal: Negative.  Negative for nausea, vomiting, abdominal pain, diarrhea and constipation.   Genitourinary: Positive for frequency and flank pain. Negative for dysuria, urgency, hematuria, decreased urine volume, vaginal bleeding, vaginal discharge, genital sores, vaginal pain, menstrual problem and pelvic pain.   Musculoskeletal: Negative for myalgias and arthralgias.   Skin: Negative.  Negative for rash.   Neurological: Negative.  Negative for dizziness, light-headedness and headaches.   Psychiatric/Behavioral: Negative.    All other systems reviewed and are negative.        Filed Vitals:    07/05/11 1745   BP: 101/58   Pulse: 109   Temp: 98.5 ??F (36.9 ??C)   Resp: 22   SpO2: 99%            Physical Exam   Nursing note and vitals reviewed.  Constitutional: She is oriented to person, place, and time. She appears well-developed and well-nourished. No distress.        She is lying comfortably in bed with legs crossed and texting on her phone with no signs of distress   HENT:   Head: Normocephalic and atraumatic.    Eyes: Conjunctivae are normal.   Neck: Normal range of motion. Neck supple.   Cardiovascular: Normal rate, regular rhythm, normal heart sounds and intact distal pulses.    Pulmonary/Chest: Effort normal and breath sounds normal. No respiratory distress.   Abdominal: Soft. Bowel sounds are normal. There is no tenderness.   Musculoskeletal: Normal range of motion.        Arms:  Neurological: She is alert and oriented to person, place, and time.   Skin: Skin is warm and dry. She is not diaphoretic.   Psychiatric: She has a normal mood and affect. Her behavior is normal.        MDM     Amount and/or Complexity of Data Reviewed:   Clinical lab tests:  Ordered and reviewed  Discussion of test results with the performing providers:  No   Decide to obtain previous medical records or to obtain history from someone other than the patient:  No   Obtain history from someone other than the patient:  No   Review and summarize past medical records:  No   Discuss the patient with another provider:  No   Independant visualization of image, tracing, or specimen:  No  Risk of Significant Complications, Morbidity, and/or Mortality:   Presenting problems:  Low  Diagnostic procedures:  Low  Management options:  Low  Progress:   Patient progress:  Stable      Procedures

## 2011-07-05 NOTE — ED Notes (Signed)
[facility]

## 2011-07-05 NOTE — ED Notes (Signed)
Pt left and went to the cafeteria and now has returned to be seen.

## 2011-07-06 MED ORDER — PHENAZOPYRIDINE 200 MG TAB
200 mg | ORAL_TABLET | Freq: Three times a day (TID) | ORAL | Status: AC
Start: 2011-07-06 — End: 2011-07-07

## 2011-07-06 MED ORDER — CEPHALEXIN 500 MG CAP
500 mg | ORAL_CAPSULE | Freq: Four times a day (QID) | ORAL | Status: AC
Start: 2011-07-06 — End: 2011-07-15

## 2011-07-06 MED ORDER — HYDROCODONE-ACETAMINOPHEN 10 MG-325 MG TAB
10-325 mg | ORAL_TABLET | Freq: Four times a day (QID) | ORAL | Status: DC | PRN
Start: 2011-07-06 — End: 2011-09-07

## 2011-09-07 NOTE — ED Provider Notes (Signed)
HPI Comments: Pt is a 31 yo female who c/o R flank pain for the past week with worsening. States she has known kidney stones in both kidneys, is set to follow up with Dr. Cathie Beams on Monday for these issues. States she's been on lortab for the pain, however they 'make me stop pooping, and I like to poop'. Denies hematuria, denies urinary symptoms. Pt in NAD in room. Denies F/N/V/D.      pmhx - kidney stones, hydronephrosis  shx - none    Patient is a 31 y.o. female presenting with kidney stones. The history is provided by the patient.   Kidney Stone   This is a new problem. The current episode started more than 1 week ago. The problem has not changed since onset.The problem occurs constantly. The pain is associated with no known injury. The pain is present in the right side. The pain is at a severity of 10/10. The pain is the same all the time. Pertinent negatives include no chest pain, no fever, no numbness, no weight loss, no headaches, no abdominal pain, no abdominal swelling, no bowel incontinence, no perianal numbness, no bladder incontinence, no dysuria, no pelvic pain, no leg pain, no paresthesias, no paresis, no tingling and no weakness.        Past Medical History   Diagnosis Date   ??? Postpartum fever 05/09/2008   ??? Anemia of pregnancy 05/09/2008   ??? General conditions of the kidney and ureter      kidney stones   ??? General conditions of the kidney and ureter      hydronephrosis   ??? GERD (gastroesophageal reflux disease)      mild   ??? Previous cesarean delivery, delivered 05/09/2008   ??? Chronic kidney disease      kidney stones        Past Surgical History   Procedure Date   ??? Pr cesarean delivery only 2001, 2006, 2009   ??? Hx other surgical 201, 2006     lithotripsy x2   ??? Hx other surgical 10/11/09     kidney-stent   ??? Hx other surgical      c-section   ??? Hx lithotripsy      x3   ??? Hx tonsillectomy    ??? Hx cesarean section      x4   ??? Hx tubal ligation      Nov 17, 2008         Family History   Problem  Relation Age of Onset   ??? Alcohol abuse Neg Hx    ??? Arthritis-rheumatoid Neg Hx    ??? Asthma Neg Hx    ??? Bleeding Prob Neg Hx    ??? Diabetes Neg Hx    ??? Heart Disease Neg Hx    ??? Migraines Neg Hx    ??? Psychiatric Disorder Neg Hx    ??? Stroke Neg Hx    ??? Elevated Lipids Neg Hx    ??? Headache Neg Hx    ??? Hypertension Neg Hx    ??? Lung Disease Neg Hx    ??? Mental Retardation Neg Hx    ??? Cancer Maternal Aunt         History     Social History   ??? Marital Status: SINGLE     Spouse Name: N/A     Number of Children: N/A   ??? Years of Education: N/A     Occupational History   ??? Not on file.  Social History Main Topics   ??? Smoking status: Never Smoker    ??? Smokeless tobacco: Never Used   ??? Alcohol Use: No   ??? Drug Use: No   ??? Sexually Active: Yes -- Female partner(s)     Birth Control/ Protection: Surgical     Other Topics Concern   ??? Not on file     Social History Narrative   ??? No narrative on file                  ALLERGIES: Meperidine      Review of Systems   Constitutional: Negative for fever and weight loss.   Respiratory: Negative for cough and shortness of breath.    Cardiovascular: Negative for chest pain.   Gastrointestinal: Negative for nausea, vomiting, abdominal pain, diarrhea, constipation and bowel incontinence.   Genitourinary: Negative for bladder incontinence, dysuria and pelvic pain.   Neurological: Negative for tingling, weakness, numbness, headaches and paresthesias.       Filed Vitals:    09/07/11 1911   BP: 123/60   Pulse: 86   Temp: 98 ??F (36.7 ??C)   Resp: 18   Height: 6' (1.829 m)   Weight: 68.04 kg (150 lb)   SpO2: 98%            Physical Exam   Nursing note and vitals reviewed.  Constitutional: She is oriented to person, place, and time. She appears well-developed and well-nourished.  Non-toxic appearance. She does not have a sickly appearance. She does not appear ill. No distress.   HENT:   Head: Normocephalic and atraumatic.   Eyes: Conjunctivae and EOM are normal. Pupils are equal, round, and reactive  to light.   Neck: Normal range of motion. Neck supple.   Cardiovascular: Normal rate, regular rhythm and normal heart sounds.  PMI is not displaced.  Exam reveals no gallop.    No murmur heard.  Pulmonary/Chest: Effort normal and breath sounds normal. No respiratory distress. She has no decreased breath sounds. She has no wheezes. She has no rhonchi. She has no rales.   Abdominal: Soft. There is no tenderness. There is CVA tenderness (right flank pain). There is no rigidity, no rebound, no guarding, no tenderness at McBurney's point and negative Murphy's sign.   Musculoskeletal: Normal range of motion. She exhibits no edema and no tenderness.   Neurological: She is alert and oriented to person, place, and time.   Skin: Skin is warm and dry. She is not diaphoretic.   Psychiatric: She has a normal mood and affect. Her behavior is normal. Judgment and thought content normal.        MDM    Procedures    Assessment: right flank pain    Plan: toradol, zofran. Fu with Cathie Beams as discussed. Return if s/sxs worsen or change.      I have discussed the results of labs, procedures, radiographs, treatments as well as any previous results found within the [facility] with the patient and available family.?? A treatment plan was developed in conjunction with the patient and was agreed upon. The patient is ready for discharge at this time.?? All voiced understanding of the discharge plan and medication instructions or changes as appropriate.?? Questions about treatment in the ED were answered.?? The patient was encouraged to return should symptoms worsen or new problems develop. A follow up physician was provided to the patient on the discharge papers.

## 2011-09-07 NOTE — ED Notes (Signed)
I have reviewed discharge instructions with the patient.  The patient verbalized understanding. Patient verbalizes understanding of Rx. Instructions. Patient alert, ambulatory on dc

## 2011-09-08 LAB — URINE MICROSCOPIC
Casts: 0 /LPF
Crystals, urine: 0 /LPF

## 2011-09-08 LAB — HCG URINE, QL. - POC: Pregnancy test,urine (POC): NEGATIVE

## 2011-09-08 MED ORDER — ONDANSETRON HCL 4 MG TAB
4 mg | ORAL_TABLET | Freq: Three times a day (TID) | ORAL | Status: DC | PRN
Start: 2011-09-08 — End: 2012-03-28

## 2011-09-08 MED ORDER — KETOROLAC TROMETHAMINE 10 MG TAB
10 mg | ORAL_TABLET | Freq: Four times a day (QID) | ORAL | Status: DC | PRN
Start: 2011-09-08 — End: 2011-10-09

## 2011-10-09 LAB — URINE MICROSCOPIC
Bacteria: 0 /HPF
Casts: 0 /LPF
Crystals, urine: 0 /LPF
Mucus: 0 /LPF

## 2011-10-09 LAB — HCG URINE, QL: HCG urine, QL: NEGATIVE

## 2011-10-09 LAB — CREATININE: Creatinine: 0.9 MG/DL (ref 0.6–1.0)

## 2011-10-09 MED ORDER — KETOROLAC TROMETHAMINE 60 MG/2 ML IM
60 mg/2 mL | INTRAMUSCULAR | Status: AC
Start: 2011-10-09 — End: 2011-10-09
  Administered 2011-10-09: 14:00:00 via INTRAMUSCULAR

## 2011-10-09 MED ORDER — KETOROLAC TROMETHAMINE 10 MG TAB
10 mg | ORAL_TABLET | Freq: Four times a day (QID) | ORAL | Status: DC | PRN
Start: 2011-10-09 — End: 2012-03-28

## 2011-10-09 MED FILL — KETOROLAC TROMETHAMINE 60 MG/2 ML IM: 60 mg/2 mL | INTRAMUSCULAR | Qty: 2

## 2011-10-09 NOTE — ED Notes (Cosign Needed)
Pt is a 31 yo female who c/o bilateral flank pain that has been ongoing since last week. States that she went to the [facility] in [place] this weekend and had a CT done confirming that she had 'a 3.3 cm R kidney stone' as well as 'a bunch' in her left kidney. Has significant history of stones, had her last lithotripsy performed in 2012 by Dr. Linde Gillis. Denies F/N/V/D. Denies having hematuria now, states 'it just stopped'. Is still capable of urinating. States she has an appt with the urologist on May 18, has been trying to have it moved up. Received a Rx for Tylox at the [facility] th[facility] weekend, denies relief of pain with it. Pt in NAD in room.      abd exam: soft abdomen with normal bowel sounds, no distention, guarding, rigidity, rebound. Minimal flank pain bilaterally.    Will request records of visit from the [facility] for this weekend. Pt in agreement.

## 2011-10-09 NOTE — ED Notes (Signed)
I have reviewed discharge instructions with the patient.  The patient verbalized understanding.

## 2011-10-09 NOTE — ED Provider Notes (Signed)
HPI Comments: Pt is a 31 yo female who c/o bilateral flank pain that has been ongoing since last week. States that she went to the [facility] in [place] this weekend and had a CT done confirming that she had 'a 3.3 cm R kidney stone' as well as 'a bunch' in her left kidney. Has significant history of stones, had her last lithotripsy performed in 2012 by Dr. Linde Gillis. Denies F/N/V/D. Denies having hematuria now, states 'it just stopped'. Is still capable of urinating. States she has an appt with the urologist on May 18, has been trying to have it moved up. Received a Rx for Tylox at the [facility] th[facility] weekend, denies relief of pain with it. Pt in NAD in room. Came today because the pain has worsened, states the pain is now bilaterally whereas before it was only on the R side.          pmhx - kidney stones, GERD, C-section  shx - none    Patient is a 31 y.o. female presenting with flank pain. The history is provided by the patient.   Flank Pain   This is a recurrent problem. The current episode started more than 2 days ago. The problem has been gradually worsening.        Past Medical History   Diagnosis Date   ??? Postpartum fever 05/09/2008   ??? Anemia of pregnancy 05/09/2008   ??? General conditions of the kidney and ureter      kidney stones   ??? General conditions of the kidney and ureter      hydronephrosis   ??? GERD (gastroesophageal reflux disease)      mild   ??? Previous cesarean delivery, delivered 05/09/2008   ??? Chronic kidney disease      kidney stones        Past Surgical History   Procedure Date   ??? Pr cesarean delivery only 2001, 2006, 2009   ??? Hx other surgical 201, 2006     lithotripsy x2   ??? Hx other surgical 10/11/09     kidney-stent   ??? Hx other surgical      c-section   ??? Hx lithotripsy      x3   ??? Hx tonsillectomy    ??? Hx cesarean section      x4   ??? Hx tubal ligation      Nov 17, 2008         Family History   Problem Relation Age of Onset   ??? Alcohol abuse Neg Hx    ???  Arthritis-rheumatoid Neg Hx    ??? Asthma Neg Hx    ??? Bleeding Prob Neg Hx    ??? Diabetes Neg Hx    ??? Heart Disease Neg Hx    ??? Migraines Neg Hx    ??? Psychiatric Disorder Neg Hx    ??? Stroke Neg Hx    ??? Elevated Lipids Neg Hx    ??? Headache Neg Hx    ??? Hypertension Neg Hx    ??? Lung Disease Neg Hx    ??? Mental Retardation Neg Hx    ??? Cancer Maternal Aunt         History     Social History   ??? Marital Status: SINGLE     Spouse Name: N/A     Number of Children: N/A   ??? Years of Education: N/A     Occupational History   ??? Not on file.  Social History Main Topics   ??? Smoking status: Never Smoker    ??? Smokeless tobacco: Never Used   ??? Alcohol Use: No   ??? Drug Use: No   ??? Sexually Active: Yes -- Female partner(s)     Birth Control/ Protection: Surgical     Other Topics Concern   ??? Not on file     Social History Narrative   ??? No narrative on file                  ALLERGIES: Meperidine      Review of Systems   Genitourinary: Positive for flank pain.       Filed Vitals:    10/09/11 0943   BP: 119/56   Pulse: 76   Temp: 98.5 ??F (36.9 ??C)   Resp: 20   Height: 6' (1.829 m)   SpO2: 99%            Physical Exam   Nursing note and vitals reviewed.  Constitutional: She is oriented to person, place, and time. She appears well-developed and well-nourished.  Non-toxic appearance. She does not have a sickly appearance. She does not appear ill. No distress.   HENT:   Head: Normocephalic and atraumatic.   Eyes: Conjunctivae and EOM are normal. Pupils are equal, round, and reactive to light.   Neck: Normal range of motion. Neck supple.   Cardiovascular: Normal rate, regular rhythm and normal heart sounds.  PMI is not displaced.  Exam reveals no gallop.    No murmur heard.  Pulmonary/Chest: Effort normal and breath sounds normal. No respiratory distress. She has no decreased breath sounds. She has no wheezes. She has no rhonchi. She has no rales.   Abdominal: Soft. There is no tenderness. There is CVA tenderness. There is no rigidity, no  rebound, no guarding, no tenderness at McBurney's point and negative Murphy's sign.       Musculoskeletal: Normal range of motion. She exhibits no edema and no tenderness.   Neurological: She is alert and oriented to person, place, and time.   Skin: Skin is warm and dry. She is not diaphoretic.   Psychiatric: She has a normal mood and affect. Her behavior is normal. Judgment and thought content normal.        MDM     Differential Diagnosis; Clinical Impression; Plan:     Report from [facility] of CT abd/pelv performed on 10/07/11:     'impression:   1. Moderate to severe pelvocaliectasis of the R kidney with a 0.6 cm calcification in the proximal right kideny.  2. Multiple calyceal calcifications in both kidneys. 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  3. There is a 0.3 cm calcification in the R mid ureter. By Coralyn Mark MD'  Amount and/or Complexity of Data Reviewed:    Discuss the patient with another provider:  Yes Delane Ginger  )        Procedures    I have discussed the results of labs, procedures, radiographs, treatments as well as any previous results found within the [facility]. [employer] with the patient and available family.?? A treatment plan was developed in conjunction with the patient and was agreed upon. The patient is ready for discharge at this time.?? All voiced understanding of the discharge plan and medication instructions or changes as appropriate.?? Questions about treatment in the ED were answered.?? The patient was encouraged to return should symptoms worsen or new problems develop. A follow  up physician was provided to the patient on the discharge papers.

## 2011-10-09 NOTE — ED Notes (Signed)
Patient was seen 2 day s ago in [facility] ED. States multiple kidney stones. States pain medicine is not helping with pain. Unable to obtain a urine.patient states unable to go at this time.

## 2011-10-09 NOTE — ED Notes (Cosign Needed)
Report from [facility] of CT abd/pelv performed on 10/07/11:     'impression:   1. Moderate to severe pelvocaliectasis of the R kidney with a 0.6 cm calcification in the proximal right kideny.  2. Multiple calyceal calcifications in both kidneys. 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  3. There is a 0.3 cm calcification in the R mid ureter. By Coralyn Mark MD'

## 2011-10-09 NOTE — ED Notes (Cosign Needed)
Consulted Flanagan's office who was in agreement with assessment and plan, will followup with tomorrow or the next. Discussed this with pt who was in agreement as well.

## 2012-03-28 NOTE — ED Notes (Signed)
Pt is in restroom.

## 2012-03-28 NOTE — ED Notes (Signed)
Bloodwork unconcerning including NL WBC and renal function. R buttock wound does not look quite ready for I&D but if warm compresses at home don't open it up and get it draining over the 24-48hrs it will very likely need to be I&D'd then. I explained this to pt. She has a UTI. I will put her on bactrim which should cover the UTI and the abscess and Rx for bactroban too. Renal US pending. Pt has had several CT urograms here and her urologist is at [facility] so I imagine she has had several there too. I opted for a renal US to avoid the radiation exposure and to r/o hydro. This is still pending. Pt turned over to Dr. Rolan Lipa who will followup on [place] and dispo as appropriate. I anticipate probable discharge.

## 2012-03-28 NOTE — ED Provider Notes (Signed)
HPI Comments: Pt with hx of kidney stones and 5 procedures - last in April. Onset yest with hematuria, no dysuria. + L flank, sharp, feels like contractions, says it feels like a kidney stone. Sl N. No V,F. +C, cold. No CP, SOB,cough,abdominal pain. Has a skin sore on buttock x3d, TTP and getting bigger. No known drainage. No hx of similar sores. Started off like a pimple. No meds tried. At [facility] since 1pm trying to get seen but they were too busy so she left and came here.    Patient is a 31 y.o. female presenting with skin problem and hematuria. The history is provided by the patient.   Skin Problem     Blood in Urine   Associated symptoms include chills, nausea, hematuria and flank pain. Pertinent negatives include no vomiting and no abdominal pain.        Past Medical History   Diagnosis Date   ??? Postpartum fever 05/09/2008   ??? Anemia of pregnancy 05/09/2008   ??? General conditions of the kidney and ureter      kidney stones   ??? General conditions of the kidney and ureter      hydronephrosis   ??? GERD (gastroesophageal reflux disease)      mild   ??? Previous cesarean delivery, delivered 05/09/2008   ??? Chronic kidney disease      kidney stones        Past Surgical History   Procedure Date   ??? Pr cesarean delivery only 2001, 2006, 2009   ??? Hx other surgical 201, 2006     lithotripsy x2   ??? Hx other surgical 10/11/09     kidney-stent   ??? Hx other surgical      c-section   ??? Hx tonsillectomy    ??? Hx cesarean section      x4   ??? Hx tubal ligation      Nov 17, 2008   ??? Hx lithotripsy      x5         Family History   Problem Relation Age of Onset   ??? Alcohol abuse Neg Hx    ??? Arthritis-rheumatoid Neg Hx    ??? Asthma Neg Hx    ??? Bleeding Prob Neg Hx    ??? Diabetes Neg Hx    ??? Heart Disease Neg Hx    ??? Migraines Neg Hx    ??? Psychiatric Disorder Neg Hx    ??? Stroke Neg Hx    ??? Elevated Lipids Neg Hx    ??? Headache Neg Hx    ??? Hypertension Neg Hx    ??? Lung Disease Neg Hx    ??? Mental Retardation Neg Hx    ??? Cancer Maternal Aunt          History     Social History   ??? Marital Status: SINGLE     Spouse Name: N/A     Number of Children: N/A   ??? Years of Education: N/A     Occupational History   ??? Not on file.     Social History Main Topics   ??? Smoking status: Never Smoker    ??? Smokeless tobacco: Never Used   ??? Alcohol Use: No   ??? Drug Use: No   ??? Sexually Active: Yes -- Female partner(s)     Birth Control/ Protection: Surgical     Other Topics Concern   ??? Not on file     Social History Narrative   ???  No narrative on file                  ALLERGIES: Meperidine      Review of Systems   Constitutional: Positive for chills. Negative for fever.   HENT: Negative for congestion.    Respiratory: Negative for cough, chest tightness and shortness of breath.    Cardiovascular: Negative for chest pain.   Gastrointestinal: Positive for nausea. Negative for vomiting and abdominal pain.   Genitourinary: Positive for hematuria and flank pain. Negative for dysuria.   Skin: Positive for color change.   All other systems reviewed and are negative.        Filed Vitals:    03/28/12 1851   BP: 120/79   Pulse: 86   Temp: 98.6 ??F (37 ??C)   Resp: 16   Height: 6' (1.829 m)   Weight: 70.308 kg (155 lb)   SpO2: 100%            Physical Exam   Nursing note and vitals reviewed.  Constitutional: She is oriented to person, place, and time. She appears well-developed and well-nourished. She appears distressed (mod due to pain, lying curled on L side to keep pressure off R buttock sore).   HENT:   Head: Normocephalic and atraumatic.   Nose: Nose normal.   Mouth/Throat: Oropharynx is clear and moist.   Eyes: EOM are normal. Pupils are equal, round, and reactive to light.   Neck: Normal range of motion. Neck supple.   Cardiovascular: Normal rate, regular rhythm, normal heart sounds and intact distal pulses.    Pulmonary/Chest: Effort normal and breath sounds normal. No respiratory distress. She has no wheezes. She has no rales. She exhibits no tenderness.   Abdominal: Soft. She exhibits  no distension and no mass. There is no tenderness. There is no rebound and no guarding.        thin   Musculoskeletal: Normal range of motion. She exhibits no edema and no tenderness.        L flank min TTP   Neurological: She is alert and oriented to person, place, and time.        Grossly nonfocal   Skin: Skin is warm and dry. She is not diaphoretic.        Numerous large tattoos, pt has a relatively early R mid buttock abscess with a central scab, approx 1cm in diameter, no obvious fluctuance, and surrounding erythema extending out approx 3cm from center, no drainage   Psychiatric: She has a normal mood and affect.        MDM    Procedures

## 2012-03-28 NOTE — ED Notes (Signed)
Patient resting in room, waiting physician

## 2012-03-28 NOTE — ED Notes (Signed)
Patient to ultrasound

## 2012-03-28 NOTE — ED Notes (Signed)
Patient states she has a "bite at the top of her butt", left upper gluteus has a reddened area with intact scab.  Also states she is urinating blood, pick tinged urine per patient. With associated left flank pain, she states history of kidney stones

## 2012-03-29 LAB — URINE MICROSCOPIC
Casts: 0 /LPF
Crystals, urine: 0 /LPF
Mucus: 0 /LPF

## 2012-03-29 LAB — CBC WITH AUTOMATED DIFF
ABS. BASOPHILS: 0 10*3/uL (ref 0.0–0.2)
ABS. EOSINOPHILS: 0 10*3/uL (ref 0.0–0.8)
ABS. IMM. GRANS.: 0 10*3/uL (ref 0.0–0.5)
ABS. LYMPHOCYTES: 1.9 10*3/uL (ref 0.5–4.6)
ABS. MONOCYTES: 0.6 10*3/uL (ref 0.1–1.3)
ABS. NEUTROPHILS: 7.1 10*3/uL (ref 1.7–8.2)
BASOPHILS: 0 % (ref 0.0–2.0)
EOSINOPHILS: 0 % — ABNORMAL LOW (ref 0.5–7.8)
HCT: 34.1 % — ABNORMAL LOW (ref 35.8–46.3)
HGB: 11.4 g/dL — ABNORMAL LOW (ref 11.7–15.4)
IMMATURE GRANULOCYTES: 0.2 % (ref 0.0–5.0)
LYMPHOCYTES: 20 % (ref 13–44)
MCH: 31.6 PG (ref 26.1–32.9)
MCHC: 33.4 g/dL (ref 31.4–35.0)
MCV: 94.5 FL (ref 79.6–97.8)
MONOCYTES: 6 % (ref 4.0–12.0)
MPV: 9.5 FL — ABNORMAL LOW (ref 10.8–14.1)
NEUTROPHILS: 74 % (ref 43–78)
PLATELET: 216 10*3/uL (ref 150–450)
RBC: 3.61 M/uL — ABNORMAL LOW (ref 4.05–5.25)
RDW: 13.6 % (ref 11.9–14.6)
WBC: 9.6 10*3/uL (ref 4.3–11.1)

## 2012-03-29 LAB — METABOLIC PANEL, BASIC
Anion gap: 6 mmol/L — ABNORMAL LOW (ref 7–16)
BUN: 9 MG/DL (ref 6–23)
CO2: 28 MMOL/L (ref 21–32)
Calcium: 9.1 MG/DL (ref 8.3–10.4)
Chloride: 109 MMOL/L — ABNORMAL HIGH (ref 98–107)
Creatinine: 0.87 MG/DL (ref 0.6–1.0)
GFR est AA: >60 mL/min/{1.73_m2} (ref >60)
GFR est non-AA: >60 mL/min/{1.73_m2} (ref >60)
Glucose: 87 MG/DL (ref 65–100)
Potassium: 3.6 MMOL/L (ref 3.5–5.1)
Sodium: 143 MMOL/L (ref 136–145)

## 2012-03-29 LAB — HCG URINE, QL. - POC: Pregnancy test,urine (POC): NEGATIVE

## 2012-03-29 MED ADMIN — morphine injection 4 mg: INTRAVENOUS | @ 01:00:00 | NDC 00409189101

## 2012-03-29 MED ADMIN — phenazopyridine (PYRIDIUM) tablet 200 mg: ORAL | @ 06:00:00 | NDC 68084029311

## 2012-03-29 MED ADMIN — trimethoprim-sulfamethoxazole (BACTRIM DS, SEPTRA DS) 160-800 mg per tablet 1 Tab: ORAL | @ 03:00:00 | NDC 68084023011

## 2012-03-29 MED ADMIN — ondansetron (ZOFRAN ODT) tablet 8 mg: ORAL | @ 01:00:00 | NDC 68462015840

## 2012-03-29 MED ADMIN — ceFAZolin (ANCEF) 1 g in 0.9% sodium chloride (MBP/ADV) 50 mL MBP: INTRAVENOUS | @ 01:00:00 | NDC 00781345170

## 2012-03-29 MED ADMIN — morphine injection 4 mg: INTRAVENOUS | @ 05:00:00 | NDC 00409189101

## 2012-03-29 MED ADMIN — 0.9% sodium chloride infusion 1,000 mL: INTRAVENOUS | @ 01:00:00 | NDC 00409798309

## 2012-03-29 MED FILL — CEFAZOLIN 1 GRAM SOLUTION FOR INJECTION: 1 gram | INTRAMUSCULAR | Qty: 1000

## 2012-03-29 MED FILL — PHENAZOPYRIDINE 200 MG TAB: 200 mg | ORAL | Qty: 1

## 2012-03-29 MED FILL — MORPHINE 4 MG/ML SYRINGE: 4 mg/mL | INTRAMUSCULAR | Qty: 1

## 2012-03-29 MED FILL — ONDANSETRON 8 MG TAB, RAPID DISSOLVE: 8 mg | ORAL | Qty: 1

## 2012-03-29 MED FILL — TRIMETHOPRIM-SULFAMETHOXAZOLE 160 MG-800 MG TAB: 160-800 mg | ORAL | Qty: 1

## 2012-03-29 NOTE — ED Notes (Signed)
Patient is resting in bed with eyes closed.

## 2012-03-29 NOTE — ED Notes (Signed)
patient was given discharge instructions and  was given prescriptions. patient verbalized understanding , with no further questions.  Patient alert discharged ambulatory, and without additional complaints at time of discharge.  No apparent distress noted

## 2012-04-29 NOTE — ED Notes (Signed)
Blanket given to pt

## 2012-04-29 NOTE — ED Provider Notes (Signed)
Patient is a 31 y.o. female presenting with sinus problems. The history is provided by the patient.   Sinus Infection   This is a new problem. The current episode started 2 days ago. The problem has been gradually worsening. There has been no fever. The pain is severe. The pain has been constant since onset. Associated symptoms include chills, congestion, ear pain, sinus pressure, sore throat, cough, rhinorrhea, headaches and chest pain. She has tried nothing for the symptoms.        Past Medical History   Diagnosis Date   ??? Postpartum fever 05/09/2008   ??? Anemia of pregnancy 05/09/2008   ??? General conditions of the kidney and ureter      kidney stones   ??? General conditions of the kidney and ureter      hydronephrosis   ??? GERD (gastroesophageal reflux disease)      mild   ??? Previous cesarean delivery, delivered 05/09/2008   ??? Chronic kidney disease      kidney stones        Past Surgical History   Procedure Date   ??? Pr cesarean delivery only 2001, 2006, 2009   ??? Hx other surgical 201, 2006     lithotripsy x2   ??? Hx other surgical 10/11/09     kidney-stent   ??? Hx other surgical      c-section   ??? Hx tonsillectomy    ??? Hx cesarean section      x4   ??? Hx tubal ligation      Nov 17, 2008   ??? Hx lithotripsy      x5         Family History   Problem Relation Age of Onset   ??? Alcohol abuse Neg Hx    ??? Arthritis-rheumatoid Neg Hx    ??? Asthma Neg Hx    ??? Bleeding Prob Neg Hx    ??? Diabetes Neg Hx    ??? Heart Disease Neg Hx    ??? Migraines Neg Hx    ??? Psychiatric Disorder Neg Hx    ??? Stroke Neg Hx    ??? Elevated Lipids Neg Hx    ??? Headache Neg Hx    ??? Hypertension Neg Hx    ??? Lung Disease Neg Hx    ??? Mental Retardation Neg Hx    ??? Cancer Maternal Aunt         History     Social History   ??? Marital Status: SINGLE     Spouse Name: N/A     Number of Children: N/A   ??? Years of Education: N/A     Occupational History   ??? Not on file.     Social History Main Topics   ??? Smoking status: Never Smoker    ??? Smokeless tobacco: Never Used   ???  Alcohol Use: No   ??? Drug Use: No   ??? Sexually Active: Yes -- Female partner(s)     Birth Control/ Protection: Surgical     Other Topics Concern   ??? Not on file     Social History Narrative   ??? No narrative on file                  ALLERGIES: Meperidine      Review of Systems   Constitutional: Positive for chills.   HENT: Positive for ear pain, congestion, sore throat, rhinorrhea and sinus pressure.    Respiratory: Positive for cough.  Cardiovascular: Positive for chest pain.   Neurological: Positive for headaches.   All other systems reviewed and are negative.        Filed Vitals:    04/29/12 1100   BP: 117/62   Pulse: 104   Temp: 98.4 ??F (36.9 ??C)   Resp: 18   Height: 6' (1.829 m)   Weight: 68.04 kg (150 lb)   SpO2: 99%            Physical Exam   Constitutional: She is oriented to person, place, and time. She appears well-developed and well-nourished. She appears distressed.   HENT:   Head: Normocephalic and atraumatic.        Bilateral effusions noted behind TMS, mild erythema of TM noted on right, posterior pharynx mildly erythematous, no exudate or swelling   Eyes: Conjunctivae and EOM are normal. Pupils are equal, round, and reactive to light.   Neck: Normal range of motion. Neck supple.   Cardiovascular: Regular rhythm, normal heart sounds and intact distal pulses.    Pulmonary/Chest: Effort normal and breath sounds normal.   Abdominal: Soft.   Musculoskeletal: Normal range of motion.   Neurological: She is alert and oriented to person, place, and time.   Skin: Skin is warm and dry. She is not diaphoretic.   Psychiatric: She has a normal mood and affect. Her behavior is normal. Judgment and thought content normal.        MDM     Differential Diagnosis; Clinical Impression; Plan:     Symptoms consistent with URI versus bronchitis with bilateral ear effusions          Procedures

## 2012-04-29 NOTE — ED Notes (Signed)
Pt eating candy bar upon arrival to room

## 2012-04-29 NOTE — ED Notes (Signed)
Pt st headache, cough, and chest pain when she breaths. Pt st symptoms started x2 days ago Pt st she has been taking OTC medication without relief.

## 2012-04-29 NOTE — ED Notes (Signed)
I have reviewed discharge instructions with the patient.  The patient verbalized understanding.

## 2012-05-10 LAB — HCG URINE, QL. - POC: Pregnancy test,urine (POC): NEGATIVE

## 2012-05-10 MED ADMIN — morphine injection 2 mg: INTRAMUSCULAR | @ 22:00:00 | NDC 00409189001

## 2012-05-10 MED ADMIN — ketorolac tromethamine (TORADOL) 60 mg/2 mL injection 60 mg: INTRAMUSCULAR | @ 22:00:00 | NDC 00409379601

## 2012-05-10 MED FILL — MORPHINE 2 MG/ML INJECTION: 2 mg/mL | INTRAMUSCULAR | Qty: 1

## 2012-05-10 MED FILL — KETOROLAC TROMETHAMINE 60 MG/2 ML IM: 60 mg/2 mL | INTRAMUSCULAR | Qty: 2

## 2012-05-10 NOTE — ED Provider Notes (Signed)
I was personally available for consultation in the emergency department.  I have reviewed the chart and agree with the documentation recorded by the MLP, including the assessment, treatment plan, and disposition.  Linde Wilensky S Dial III, MD

## 2012-05-10 NOTE — ED Notes (Signed)
I have reviewed discharge instructions with the patient.  The patient verbalized understanding.

## 2012-05-10 NOTE — ED Provider Notes (Signed)
HPI Comments: Pt with h/o kidney stones, seen here for same, reports rt flank pain for past 2-3 days, has appt with urologist 11-4, told to come here for pain, no fever, no nv, no  Dark urine    Patient is a 31 y.o. female presenting with back pain. The history is provided by the patient.   Back Pain   This is a recurrent problem. The current episode started more than 2 days ago. The problem has not changed since onset.The problem occurs rarely. Patient reports not work related injury.The pain is associated with no known injury. The pain is present in the right side. The quality of the pain is described as aching and similar to previous episodes. The pain does not radiate. The pain is at a severity of 10/10. The pain is moderate. The pain is the same all the time. Pertinent negatives include no pelvic pain, no paresis, no tingling and no weakness. She has tried nothing for the symptoms. The treatment provided no relief. Risk factors include history of kidney stones.        Past Medical History   Diagnosis Date   ??? Postpartum fever 05/09/2008   ??? Anemia of pregnancy 05/09/2008   ??? General conditions of the kidney and ureter      kidney stones   ??? General conditions of the kidney and ureter      hydronephrosis   ??? GERD (gastroesophageal reflux disease)      mild   ??? Previous cesarean delivery, delivered 05/09/2008   ??? Chronic kidney disease      kidney stones        Past Surgical History   Procedure Laterality Date   ??? Pr cesarean delivery only  2001, 2006, 2009   ??? Hx other surgical  201, 2006     lithotripsy x2   ??? Hx other surgical  10/11/09     kidney-stent   ??? Hx other surgical       c-section   ??? Hx tonsillectomy     ??? Hx cesarean section       x4   ??? Hx tubal ligation       Nov 17, 2008   ??? Hx lithotripsy       x5         Family History   Problem Relation Age of Onset   ??? Alcohol abuse Neg Hx    ??? Arthritis-rheumatoid Neg Hx    ??? Asthma Neg Hx    ??? Bleeding Prob Neg Hx    ??? Diabetes Neg Hx    ??? Heart Disease Neg  Hx    ??? Migraines Neg Hx    ??? Psychiatric Disorder Neg Hx    ??? Stroke Neg Hx    ??? Elevated Lipids Neg Hx    ??? Headache Neg Hx    ??? Hypertension Neg Hx    ??? Lung Disease Neg Hx    ??? Mental Retardation Neg Hx    ??? Cancer Maternal Aunt         History     Social History   ??? Marital Status: SINGLE     Spouse Name: N/A     Number of Children: N/A   ??? Years of Education: N/A     Occupational History   ??? Not on file.     Social History Main Topics   ??? Smoking status: Never Smoker    ??? Smokeless tobacco: Never Used   ??? Alcohol Use:  No   ??? Drug Use: No   ??? Sexually Active: Yes -- Female partner(s)     Birth Control/ Protection: Surgical     Other Topics Concern   ??? Not on file     Social History Narrative   ??? No narrative on file                  ALLERGIES: Meperidine      Review of Systems   Genitourinary: Negative for pelvic pain.   Musculoskeletal: Positive for back pain.   Neurological: Negative for tingling and weakness.   All other systems reviewed and are negative.        Filed Vitals:    05/10/12 1726   BP: 179/91   Pulse: 89   Temp: 97.8 ??F (36.6 ??C)   Resp: 16   Height: 6' (1.829 m)   Weight: 68.04 kg (150 lb)            Physical Exam   Nursing note and vitals reviewed.  Constitutional: She is oriented to person, place, and time. She appears well-developed and well-nourished. No distress.   HENT:   Head: Normocephalic and atraumatic.   Right Ear: External ear normal.   Left Ear: External ear normal.   Eyes: Conjunctivae and EOM are normal. Pupils are equal, round, and reactive to light.   Neck: Normal range of motion. Neck supple.   Cardiovascular: Normal rate and regular rhythm.    Pulmonary/Chest: Effort normal and breath sounds normal. No respiratory distress. She has no wheezes.   Abdominal: Soft. Bowel sounds are normal.   Musculoskeletal: She exhibits tenderness. She exhibits no edema.   Pain to palpation over rt flank area, no abd pain , no skin changes   Neurological: She is alert and oriented to person,  place, and time.   Skin: Skin is warm.        MDM     Differential Diagnosis; Clinical Impression; Plan:     Urine hcg negative, urine dip w/o blood or wbc, pt much better post meds, pt discussed with dr. Romualdo Bolk, will discharge   Amount and/or Complexity of Data Reviewed:   Clinical lab tests:  Ordered and reviewed   Discuss the patient with another provider:  Yes  Risk of Significant Complications, Morbidity, and/or Mortality:   Presenting problems:  Moderate  Diagnostic procedures:  Moderate  Management options:  Moderate  Progress:   Patient progress:  Improved and stable      Procedures

## 2012-05-10 NOTE — ED Notes (Signed)
Unable to void in triage.

## 2012-10-03 NOTE — ED Notes (Signed)
PMD-None. Pt reports right ear pain x 4 days. She was seen at [facility] yesterday and was given Rx fro Augmentin and Norco. She has taken both and doesn't feel any better.

## 2012-10-03 NOTE — ED Provider Notes (Signed)
Patient is a 32 y.o. female presenting with ear pain. The history is provided by the patient.   Ear Pain   This is a new problem. The current episode started more than 2 days ago. The problem occurs constantly. The problem has not changed since onset.Patient complains that the right ear is affected.  There has been no fever. The pain is at a severity of 10/10. The pain is severe. Associated symptoms include hearing loss. Pertinent negatives include no ear discharge, no headaches, no rhinorrhea, no sore throat, no abdominal pain, no diarrhea, no vomiting, no neck pain, no cough and no rash. Risk factors: pt was seen at [facility] ER and dx'd with perforated TM.         Past Medical History   Diagnosis Date   ??? Postpartum fever 05/09/2008   ??? Anemia of pregnancy 05/09/2008   ??? General conditions of the kidney and ureter      kidney stones   ??? General conditions of the kidney and ureter      hydronephrosis   ??? GERD (gastroesophageal reflux disease)      mild   ??? Previous cesarean delivery, delivered 05/09/2008   ??? Chronic kidney disease      kidney stones        Past Surgical History   Procedure Laterality Date   ??? Pr cesarean delivery only  2001, 2006, 2009   ??? Hx other surgical  201, 2006     lithotripsy x2   ??? Hx other surgical  10/11/09     kidney-stent   ??? Hx other surgical       c-section   ??? Hx tonsillectomy     ??? Hx cesarean section       x4   ??? Hx tubal ligation       Nov 17, 2008   ??? Hx lithotripsy       x5         Family History   Problem Relation Age of Onset   ??? Alcohol abuse Neg Hx    ??? Arthritis-rheumatoid Neg Hx    ??? Asthma Neg Hx    ??? Bleeding Prob Neg Hx    ??? Diabetes Neg Hx    ??? Heart Disease Neg Hx    ??? Migraines Neg Hx    ??? Psychiatric Disorder Neg Hx    ??? Stroke Neg Hx    ??? Elevated Lipids Neg Hx    ??? Headache Neg Hx    ??? Hypertension Neg Hx    ??? Lung Disease Neg Hx    ??? Mental Retardation Neg Hx    ??? Cancer Maternal Aunt         History     Social History   ??? Marital Status: SINGLE     Spouse Name:  N/A     Number of Children: N/A   ??? Years of Education: N/A     Occupational History   ??? Not on file.     Social History Main Topics   ??? Smoking status: Never Smoker    ??? Smokeless tobacco: Never Used   ??? Alcohol Use: No   ??? Drug Use: No   ??? Sexually Active: Yes -- Female partner(s)     Birth Control/ Protection: Surgical     Other Topics Concern   ??? Not on file     Social History Narrative   ??? No narrative on file  ALLERGIES: Meperidine      Review of Systems   HENT: Positive for hearing loss and ear pain. Negative for sore throat, rhinorrhea, neck pain and ear discharge.    Respiratory: Negative for cough.    Gastrointestinal: Negative for vomiting, abdominal pain and diarrhea.   Skin: Negative for rash.   Neurological: Negative for headaches.   All other systems reviewed and are negative.        Filed Vitals:    10/03/12 1745   BP: 125/93   Pulse: 97   Temp: 99.8 ??F (37.7 ??C)   Resp: 20   Height: 6' (1.829 m)   Weight: 69.854 kg (154 lb)   SpO2: 100%            Physical Exam   Nursing note and vitals reviewed.  Constitutional: She is oriented to person, place, and time. She appears well-developed and well-nourished. No distress.   HENT:   Head: Normocephalic and atraumatic.   Left Ear: External ear normal.   Mouth/Throat: Oropharynx is clear and moist.   EAC, and TM without erythema; perforation in TM.   Eyes: Conjunctivae and EOM are normal. Pupils are equal, round, and reactive to light.   Neck: Normal range of motion. Neck supple.   Musculoskeletal: Normal range of motion. She exhibits no edema and no tenderness.   Neurological: She is alert and oriented to person, place, and time.   Skin: Skin is warm and dry.   Psychiatric: She has a normal mood and affect. Her behavior is normal.        MDM     Risk of Significant Complications, Morbidity, and/or Mortality:   Presenting problems:  Low  Diagnostic procedures:  Minimal  Management options:  Low  Progress:   Patient progress:  Stable       Procedures

## 2012-10-03 NOTE — ED Notes (Signed)
I have reviewed discharge instructions with the patient. Prescriptions was given.   The patient verbalized understanding.  Discharged ambulatory with spouse in no acute distress. Patient advised not to take additional tylenol, drink alcohol or drive while taking percocet .does verbalize understanding.

## 2012-10-09 LAB — URINE MICROSCOPIC
Bacteria: 0 /hpf
RBC: >100 /hpf — ABNORMAL HIGH

## 2012-10-09 LAB — HCG URINE, QL. - POC: Pregnancy test,urine (POC): NEGATIVE

## 2012-10-09 NOTE — ED Notes (Signed)
Pt c/o hematuria since this AM. Was dx with kidney stone 2 weeks ago at [facility] and has f/u with urology.

## 2012-10-09 NOTE — ED Notes (Signed)
Unable to locate pt

## 2012-10-09 NOTE — ED Notes (Signed)
Unable to locate pt when called to room.

## 2012-11-06 LAB — CBC WITH AUTOMATED DIFF
ABS. BASOPHILS: 0 10*3/uL (ref 0.0–0.2)
ABS. EOSINOPHILS: 0.1 10*3/uL (ref 0.0–0.8)
ABS. IMM. GRANS.: 0 10*3/uL (ref 0.0–0.5)
ABS. LYMPHOCYTES: 2.8 10*3/uL (ref 0.5–4.6)
ABS. MONOCYTES: 0.4 10*3/uL (ref 0.1–1.3)
ABS. NEUTROPHILS: 3.4 10*3/uL (ref 1.7–8.2)
BASOPHILS: 0 % (ref 0.0–2.0)
EOSINOPHILS: 2 % (ref 0.5–7.8)
HCT: 32 % — ABNORMAL LOW (ref 35.8–46.3)
HGB: 10.5 g/dL — ABNORMAL LOW (ref 11.7–15.4)
IMMATURE GRANULOCYTES: 0.1 % (ref 0.0–5.0)
LYMPHOCYTES: 41 % (ref 13–44)
MCH: 30.7 PG (ref 26.1–32.9)
MCHC: 32.8 g/dL (ref 31.4–35.0)
MCV: 93.6 FL (ref 79.6–97.8)
MONOCYTES: 6 % (ref 4.0–12.0)
MPV: 9.6 FL — ABNORMAL LOW (ref 10.8–14.1)
NEUTROPHILS: 51 % (ref 43–78)
PLATELET: 179 10*3/uL (ref 150–450)
RBC: 3.42 M/uL — ABNORMAL LOW (ref 4.05–5.25)
RDW: 14.6 % (ref 11.9–14.6)
WBC: 6.7 10*3/uL (ref 4.3–11.1)

## 2012-11-06 LAB — METABOLIC PANEL, BASIC
Anion gap: 8 mmol/L (ref 7–16)
BUN: 13 MG/DL (ref 6–23)
CO2: 27 mmol/L (ref 21–32)
Calcium: 8.5 MG/DL (ref 8.3–10.4)
Chloride: 102 mmol/L (ref 98–107)
Creatinine: 0.9 MG/DL (ref 0.6–1.0)
GFR est AA: >60 mL/min/{1.73_m2} (ref >60)
GFR est non-AA: >60 mL/min/{1.73_m2} (ref >60)
Glucose: 80 mg/dL (ref 65–100)
Potassium: 3.7 mmol/L (ref 3.5–5.1)
Sodium: 137 mmol/L (ref 136–145)

## 2012-11-06 LAB — DRUG SCREEN, URINE
AMPHETAMINES: NEGATIVE
BARBITURATES: NEGATIVE
BENZODIAZEPINES: NEGATIVE
COCAINE: NEGATIVE
METHADONE: NEGATIVE
OPIATES: NEGATIVE
PCP(PHENCYCLIDINE): NEGATIVE
THC (TH-CANNABINOL): POSITIVE

## 2012-11-06 LAB — URINE MICROSCOPIC

## 2012-11-06 LAB — HCG URINE, QL. - POC: Pregnancy test,urine (POC): NEGATIVE

## 2012-11-06 MED ADMIN — sodium chloride 0.9 % bolus infusion 1,000 mL: INTRAVENOUS | @ 08:00:00 | NDC 00409798309

## 2012-11-06 MED ADMIN — promethazine (PHENERGAN) 25 mg/mL injection: INTRAVENOUS | @ 08:00:00 | NDC 00641092825

## 2012-11-06 MED ADMIN — ketorolac (TORADOL) 30 mg/mL (1 mL) injection: INTRAVENOUS | @ 08:00:00 | NDC 00409379501

## 2012-11-06 NOTE — ED Notes (Signed)
I have reviewed discharge instructions with the patient.  The patient verbalized understanding.

## 2012-11-06 NOTE — ED Notes (Signed)
Pt seen and discharge during system downtime

## 2012-11-06 NOTE — ED Provider Notes (Addendum)
Patient is a 32 y.o. female presenting with flank pain. The history is provided by the patient.   Flank Pain   This is a chronic problem. The current episode started 12 to 24 hours ago. The problem has not changed since onset.The problem occurs constantly. The pain is associated with no known injury. The pain is present in the left side. The pain is at a severity of 10/10. The pain is severe. Pertinent negatives include no chest pain, no fever, no abdominal pain, no bladder incontinence, no paresthesias, no paresis and no tingling. She has tried nothing for the symptoms. Risk factors include history of kidney stones. The patient's surgical history non-contributory        Past Medical History   Diagnosis Date   ??? Postpartum fever 05/09/2008   ??? Anemia of pregnancy 05/09/2008   ??? General conditions of the kidney and ureter      kidney stones   ??? General conditions of the kidney and ureter      hydronephrosis   ??? GERD (gastroesophageal reflux disease)      mild   ??? Previous cesarean delivery, delivered 05/09/2008   ??? Chronic kidney disease      kidney stones        Past Surgical History   Procedure Laterality Date   ??? Pr cesarean delivery only  2001, 2006, 2009   ??? Hx other surgical  201, 2006     lithotripsy x2   ??? Hx other surgical  10/11/09     kidney-stent   ??? Hx other surgical       c-section   ??? Hx tonsillectomy     ??? Hx cesarean section       x4   ??? Hx tubal ligation       Nov 17, 2008   ??? Hx lithotripsy       x5         Family History   Problem Relation Age of Onset   ??? Alcohol abuse Neg Hx    ??? Arthritis-rheumatoid Neg Hx    ??? Asthma Neg Hx    ??? Bleeding Prob Neg Hx    ??? Diabetes Neg Hx    ??? Heart Disease Neg Hx    ??? Migraines Neg Hx    ??? Psychiatric Disorder Neg Hx    ??? Stroke Neg Hx    ??? Elevated Lipids Neg Hx    ??? Headache Neg Hx    ??? Hypertension Neg Hx    ??? Lung Disease Neg Hx    ??? Mental Retardation Neg Hx    ??? Cancer Maternal Aunt         History     Social History   ??? Marital Status: SINGLE     Spouse  Name: N/A     Number of Children: N/A   ??? Years of Education: N/A     Occupational History   ??? Not on file.     Social History Main Topics   ??? Smoking status: Never Smoker    ??? Smokeless tobacco: Never Used   ??? Alcohol Use: No   ??? Drug Use: No   ??? Sexually Active: Yes -- Female partner(s)     Birth Control/ Protection: Surgical     Other Topics Concern   ??? Not on file     Social History Narrative   ??? No narrative on file  ALLERGIES: Meperidine      Review of Systems   Constitutional: Negative.  Negative for fever and activity change.   HENT: Negative.    Eyes: Negative.    Respiratory: Negative.    Cardiovascular: Negative.  Negative for chest pain.   Gastrointestinal: Negative.  Negative for abdominal pain.   Genitourinary: Positive for flank pain. Negative for bladder incontinence.   Musculoskeletal: Negative.    Skin: Negative.    Neurological: Negative.  Negative for tingling and paresthesias.   Psychiatric/Behavioral: Negative.    All other systems reviewed and are negative.        Filed Vitals:    11/06/12 0104   BP: 110/63   Pulse: 82   Temp: 98.6 ??F (37 ??C)   Resp: 20   Height: 6' (1.829 m)   Weight: 68.04 kg (150 lb)   SpO2: 98%            Physical Exam   Nursing note and vitals reviewed.  Constitutional: She is oriented to person, place, and time. She appears well-developed and well-nourished.   HENT:   Head: Normocephalic and atraumatic.   Right Ear: External ear normal.   Left Ear: External ear normal.   Eyes: Conjunctivae and EOM are normal. Pupils are equal, round, and reactive to light.   Neck: Normal range of motion. Neck supple.   Cardiovascular: Normal rate, regular rhythm and intact distal pulses.    Pulmonary/Chest: Effort normal and breath sounds normal.   Abdominal: Soft. Bowel sounds are normal. There is no tenderness. There is no rebound and no guarding.   Musculoskeletal: Normal range of motion.   Neurological: She is alert and oriented to person, place, and time. No cranial  nerve deficit.   Skin: Skin is warm and dry.   Psychiatric: She has a normal mood and affect.        MDM     Amount and/or Complexity of Data Reviewed:   Clinical lab tests:  Ordered and reviewed  Tests in the radiology section of CPT??:  Ordered and reviewed  Tests in the medicine section of the CPT??:  Ordered and reviewed   Review and summarize past medical records:  Yes   Independant visualization of image, tracing, or specimen:  Yes  Risk of Significant Complications, Morbidity, and/or Mortality:   Presenting problems:  Moderate  Diagnostic procedures:  Moderate  Management options:  Moderate  Progress:   Patient progress:  Stable      Procedures

## 2013-11-03 LAB — URINE MICROSCOPIC
Bacteria: 0 /hpf
Casts: 0 /lpf
Crystals, urine: 0 /LPF
Mucus: 0 /lpf

## 2013-11-03 LAB — HCG URINE, QL. - POC: Pregnancy test,urine (POC): NEGATIVE

## 2013-11-03 MED ORDER — TAMSULOSIN SR 0.4 MG 24 HR CAP
0.4 mg | ORAL_CAPSULE | Freq: Every day | ORAL | Status: DC
Start: 2013-11-03 — End: 2014-06-30

## 2013-11-03 MED ORDER — ONDANSETRON 8 MG TAB, RAPID DISSOLVE
8 mg | ORAL_TABLET | Freq: Three times a day (TID) | ORAL | Status: DC | PRN
Start: 2013-11-03 — End: 2014-03-14

## 2013-11-03 MED ORDER — ONDANSETRON 8 MG TAB, RAPID DISSOLVE
8 mg | ORAL | Status: AC
Start: 2013-11-03 — End: 2013-11-03
  Administered 2013-11-03: 15:00:00 via ORAL

## 2013-11-03 MED ORDER — TAMSULOSIN SR 0.4 MG 24 HR CAP
0.4 mg | ORAL | Status: AC
Start: 2013-11-03 — End: 2013-11-03
  Administered 2013-11-03: 15:00:00 via ORAL

## 2013-11-03 MED ORDER — HYDROMORPHONE (PF) 1 MG/ML IJ SOLN
1 mg/mL | INTRAMUSCULAR | Status: AC
Start: 2013-11-03 — End: 2013-11-03
  Administered 2013-11-03: 15:00:00 via INTRAMUSCULAR

## 2013-11-03 MED ORDER — KETOROLAC TROMETHAMINE 60 MG/2 ML IM
60 mg/2 mL | INTRAMUSCULAR | Status: DC
Start: 2013-11-03 — End: 2013-11-03

## 2013-11-03 MED ORDER — HYDROCODONE-ACETAMINOPHEN 5 MG-325 MG TAB
5-325 mg | ORAL_TABLET | ORAL | Status: DC | PRN
Start: 2013-11-03 — End: 2014-03-14

## 2013-11-03 MED FILL — TAMSULOSIN SR 0.4 MG 24 HR CAP: 0.4 mg | ORAL | Qty: 1

## 2013-11-03 MED FILL — ONDANSETRON 8 MG TAB, RAPID DISSOLVE: 8 mg | ORAL | Qty: 1

## 2013-11-03 MED FILL — HYDROMORPHONE (PF) 1 MG/ML IJ SOLN: 1 mg/mL | INTRAMUSCULAR | Qty: 1

## 2013-11-03 MED FILL — KETOROLAC TROMETHAMINE 60 MG/2 ML IM: 60 mg/2 mL | INTRAMUSCULAR | Qty: 2

## 2013-11-03 NOTE — ED Provider Notes (Signed)
HPI Comments: Pt presents with 2 day Hx of left flank and abdominal pain radiating to the groin c/w prior episodes of renal colic. Pt reports prior lithotripsy for stones and has had multiple CT's demonstrating same.      Patient is a 33 y.o. female presenting with flank pain. The history is provided by the patient.   Flank Pain   This is a new problem. The current episode started 2 days ago. The problem has not changed since onset.The problem occurs constantly. The pain is associated with no known injury. The pain is present in the left side and middle back. The quality of the pain is described as stabbing and shooting. The pain radiates to the left groin. The pain is at a severity of 8/10. The pain is severe. The pain is the same all the time. Pertinent negatives include no chest pain, no fever, no numbness, no weight loss, no headaches, no abdominal pain, no abdominal swelling, no bowel incontinence, no perianal numbness, no bladder incontinence, no dysuria, no pelvic pain, no leg pain, no paresthesias, no paresis, no tingling and no weakness. She has tried nothing for the symptoms. The treatment provided no relief. Risk factors include history of kidney stones.        Past Medical History   Diagnosis Date   ??? Postpartum fever 05/09/2008   ??? Anemia of pregnancy 05/09/2008   ??? General conditions of the kidney and ureter      kidney stones   ??? General conditions of the kidney and ureter      hydronephrosis   ??? GERD (gastroesophageal reflux disease)      mild   ??? Previous cesarean delivery, delivered 05/09/2008   ??? Chronic kidney disease      kidney stones        Past Surgical History   Procedure Laterality Date   ??? Pr cesarean delivery only  2001, 2006, 2009   ??? Hx other surgical  201, 2006     lithotripsy x2   ??? Hx other surgical  10/11/09     kidney-stent   ??? Hx other surgical       c-section   ??? Hx tonsillectomy     ??? Hx cesarean section       x4   ??? Hx tubal ligation       Nov 17, 2008   ??? Hx lithotripsy       x5          Family History   Problem Relation Age of Onset   ??? Alcohol abuse Neg Hx    ??? Arthritis-rheumatoid Neg Hx    ??? Asthma Neg Hx    ??? Bleeding Prob Neg Hx    ??? Diabetes Neg Hx    ??? Heart Disease Neg Hx    ??? Migraines Neg Hx    ??? Psychiatric Disorder Neg Hx    ??? Stroke Neg Hx    ??? Elevated Lipids Neg Hx    ??? Headache Neg Hx    ??? Hypertension Neg Hx    ??? Lung Disease Neg Hx    ??? Mental Retardation Neg Hx    ??? Cancer Maternal Aunt         History     Social History   ??? Marital Status: SINGLE     Spouse Name: N/A     Number of Children: N/A   ??? Years of Education: N/A     Occupational History   ??? Not  on file.     Social History Main Topics   ??? Smoking status: Never Smoker    ??? Smokeless tobacco: Never Used   ??? Alcohol Use: No   ??? Drug Use: No   ??? Sexual Activity:     Partners: Male     Pharmacist, hospitalBirth Control/ Protection: Surgical     Other Topics Concern   ??? Not on file     Social History Narrative                  ALLERGIES: Meperidine      Review of Systems   Constitutional: Negative for fever and weight loss.   Cardiovascular: Negative for chest pain.   Gastrointestinal: Negative for abdominal pain and bowel incontinence.   Genitourinary: Positive for flank pain. Negative for bladder incontinence, dysuria and pelvic pain.   Neurological: Negative for tingling, weakness, numbness, headaches and paresthesias.   All other systems reviewed and are negative.      Filed Vitals:    11/03/13 0925   BP: 118/65   Pulse: 74   Temp: 98.3 ??F (36.8 ??C)   Resp: 15   Weight: 70.308 kg (155 lb)   SpO2: 99%            Physical Exam   Constitutional: She is oriented to person, place, and time. She appears well-developed and well-nourished. No distress.   HENT:   Head: Normocephalic and atraumatic.   Eyes: Conjunctivae and EOM are normal. Pupils are equal, round, and reactive to light.   Abdominal: Soft. Bowel sounds are normal. She exhibits no distension. There is no tenderness.   Left CVAT present     Neurological: She is alert and oriented  to person, place, and time.   Skin: Skin is warm and dry.   Psychiatric: She has a normal mood and affect. Her behavior is normal.   Nursing note and vitals reviewed.       MDM  Number of Diagnoses or Management Options     Amount and/or Complexity of Data Reviewed  Clinical lab tests: ordered and reviewed  Tests in the radiology section of CPT??: ordered and reviewed  Independent visualization of images, tracings, or specimens: yes    Risk of Complications, Morbidity, and/or Mortality  Presenting problems: moderate  Diagnostic procedures: moderate  Management options: moderate    Patient Progress  Patient progress: stable      Procedures

## 2013-11-03 NOTE — ED Notes (Signed)
Vaginal pain for two days. States it's a kidney stone. Refuses to give urine specimen. Aware of need.

## 2013-11-03 NOTE — ED Notes (Signed)
I have reviewed discharge instructions with the patient.  The patient verbalized understanding. Prescritptions given times 3.

## 2013-11-03 NOTE — ED Notes (Signed)
Results reviewed. Bedside US shows no hydronephrosis.

## 2013-11-05 NOTE — ED Notes (Signed)
I have reviewed medications, follow up provider options, and discharge instructions with the patient. The patient verbalized understanding. Copy of discharge information given to patient upon discharge. Prescription(s) given to patient. Patient discharged in no distress.

## 2013-11-05 NOTE — ED Notes (Signed)
Patient back from CT

## 2013-11-05 NOTE — ED Notes (Signed)
Patient presents with right and left flank pain as well as lower back pain, She was seen here 2 days ago for a kidney stone and states the pain is worse.

## 2013-11-05 NOTE — ED Notes (Signed)
Pt here for kidney stone, was seen here day before yesterday. Pt reports pain worse today.

## 2013-11-06 LAB — HCG URINE, QL. - POC: Pregnancy test,urine (POC): NEGATIVE

## 2013-11-06 LAB — URINE MICROSCOPIC: Casts: 0 /lpf

## 2013-11-06 MED ORDER — KETOROLAC TROMETHAMINE 30 MG/ML INJECTION
30 mg/mL (1 mL) | INTRAMUSCULAR | Status: AC
Start: 2013-11-06 — End: 2013-11-05
  Administered 2013-11-06: 03:00:00 via INTRAVENOUS

## 2013-11-06 MED ORDER — DOCUSATE SODIUM 100 MG CAP
100 mg | ORAL_CAPSULE | Freq: Every day | ORAL | Status: AC
Start: 2013-11-06 — End: 2013-11-06

## 2013-11-06 MED ORDER — SODIUM CHLORIDE 0.9% BOLUS IV
0.9 % | Freq: Once | INTRAVENOUS | Status: AC
Start: 2013-11-06 — End: 2013-11-05
  Administered 2013-11-06: 03:00:00 via INTRAVENOUS

## 2013-11-06 MED ORDER — TRIMETHOPRIM-SULFAMETHOXAZOLE 160 MG-800 MG TAB
160-800 mg | ORAL | Status: AC
Start: 2013-11-06 — End: 2013-11-05
  Administered 2013-11-06: 03:00:00 via ORAL

## 2013-11-06 MED ORDER — TRIMETHOPRIM-SULFAMETHOXAZOLE 160 MG-800 MG TAB
160-800 mg | ORAL_TABLET | Freq: Two times a day (BID) | ORAL | Status: AC
Start: 2013-11-06 — End: 2013-11-10

## 2013-11-06 MED ORDER — ONDANSETRON (PF) 4 MG/2 ML INJECTION
4 mg/2 mL | INTRAMUSCULAR | Status: AC
Start: 2013-11-06 — End: 2013-11-05
  Administered 2013-11-06: 03:00:00 via INTRAVENOUS

## 2013-11-06 MED ORDER — HYDROMORPHONE (PF) 1 MG/ML IJ SOLN
1 mg/mL | INTRAMUSCULAR | Status: AC
Start: 2013-11-06 — End: 2013-11-05
  Administered 2013-11-06: 03:00:00 via INTRAVENOUS

## 2013-11-06 MED FILL — TRIMETHOPRIM-SULFAMETHOXAZOLE 160 MG-800 MG TAB: 160-800 mg | ORAL | Qty: 1

## 2013-11-06 MED FILL — ONDANSETRON (PF) 4 MG/2 ML INJECTION: 4 mg/2 mL | INTRAMUSCULAR | Qty: 2

## 2013-11-06 MED FILL — KETOROLAC TROMETHAMINE 30 MG/ML INJECTION: 30 mg/mL (1 mL) | INTRAMUSCULAR | Qty: 1

## 2013-11-06 MED FILL — HYDROMORPHONE (PF) 1 MG/ML IJ SOLN: 1 mg/mL | INTRAMUSCULAR | Qty: 1

## 2013-11-07 NOTE — ED Provider Notes (Signed)
Patient is a 33 y.o. female presenting with flank pain. The history is provided by the patient.   Flank Pain   This is a recurrent problem. The current episode started more than 2 days ago. The problem has been gradually worsening. The problem occurs constantly. Patient reports not work related injury.The pain is present in the right side, left side, middle back and lower back. The quality of the pain is described as stabbing and similar to previous episodes. The pain does not radiate. The pain is moderate. Associated symptoms include abdominal pain. Pertinent negatives include no chest pain, no fever, no headaches, no paresthesias, no paresis, no tingling and no weakness. Treatments tried: hydrocodone which she "TOLD THEM YESTERDAY DOENST WORK AND ISNT STRONG ENOUGH" The treatment provided no relief. Risk factors include history of kidney stones.        Past Medical History   Diagnosis Date   ??? Postpartum fever 05/09/2008   ??? Anemia of pregnancy 05/09/2008   ??? General conditions of the kidney and ureter      kidney stones   ??? General conditions of the kidney and ureter      hydronephrosis   ??? GERD (gastroesophageal reflux disease)      mild   ??? Previous cesarean delivery, delivered 05/09/2008   ??? Chronic kidney disease      kidney stones        Past Surgical History   Procedure Laterality Date   ??? Pr cesarean delivery only  2001, 2006, 2009   ??? Hx other surgical  201, 2006     lithotripsy x2   ??? Hx other surgical  10/11/09     kidney-stent   ??? Hx other surgical       c-section   ??? Hx tonsillectomy     ??? Hx cesarean section       x4   ??? Hx tubal ligation       Nov 17, 2008   ??? Hx lithotripsy       x5         Family History   Problem Relation Age of Onset   ??? Alcohol abuse Neg Hx    ??? Arthritis-rheumatoid Neg Hx    ??? Asthma Neg Hx    ??? Bleeding Prob Neg Hx    ??? Diabetes Neg Hx    ??? Heart Disease Neg Hx    ??? Migraines Neg Hx    ??? Psychiatric Disorder Neg Hx    ??? Stroke Neg Hx    ??? Elevated Lipids Neg Hx    ??? Headache  Neg Hx    ??? Hypertension Neg Hx    ??? Lung Disease Neg Hx    ??? Mental Retardation Neg Hx    ??? Cancer Maternal Aunt         History     Social History   ??? Marital Status: SINGLE     Spouse Name: N/A     Number of Children: N/A   ??? Years of Education: N/A     Occupational History   ??? Not on file.     Social History Main Topics   ??? Smoking status: Never Smoker    ??? Smokeless tobacco: Never Used   ??? Alcohol Use: No   ??? Drug Use: No   ??? Sexual Activity:     Partners: Male     Pharmacist, hospitalBirth Control/ Protection: Surgical     Other Topics Concern   ??? Not on file  Social History Narrative                  ALLERGIES: Meperidine      Review of Systems   Constitutional: Negative for fever and chills.   HENT: Negative for rhinorrhea and sore throat.    Eyes: Negative for discharge and redness.   Respiratory: Negative for cough and shortness of breath.    Cardiovascular: Negative for chest pain and palpitations.   Gastrointestinal: Positive for nausea and abdominal pain. Negative for vomiting.   Genitourinary: Positive for flank pain.   Musculoskeletal: Positive for back pain. Negative for arthralgias.   Skin: Negative for rash.   Neurological: Negative for dizziness, tingling, weakness, headaches and paresthesias.   All other systems reviewed and are negative.      Filed Vitals:    11/05/13 2220 11/05/13 2240 11/05/13 2304 11/05/13 2320   BP: 103/52 112/56 104/47 97/50   Pulse:   66 64   Temp:       Resp:   20 20   Height:       Weight:       SpO2: 100% 93% 97% 94%            Physical Exam   Constitutional: She is oriented to person, place, and time. She appears well-developed and well-nourished. She appears distressed.   HENT:   Head: Normocephalic and atraumatic.   Eyes: Conjunctivae are normal. Pupils are equal, round, and reactive to light. Right eye exhibits no discharge. Left eye exhibits no discharge. No scleral icterus.   Neck: Normal range of motion. Neck supple.   Cardiovascular: Normal rate, regular rhythm and normal heart  sounds.  Exam reveals no gallop.    No murmur heard.  Pulmonary/Chest: Effort normal and breath sounds normal. No respiratory distress. She has no wheezes. She has no rales.   Abdominal: Soft. Bowel sounds are normal. There is no tenderness. There is no guarding.   Musculoskeletal: Normal range of motion. She exhibits tenderness. She exhibits no edema.        Lumbar back: She exhibits tenderness.        Back:    Neurological: She is alert and oriented to person, place, and time. She exhibits normal muscle tone.   cni 2-12 grossly   Skin: Skin is warm and dry. She is not diaphoretic.   Psychiatric: She has a normal mood and affect. Her behavior is normal.   Nursing note and vitals reviewed.       MDM    Procedures

## 2014-03-14 LAB — URINE MICROSCOPIC: Bacteria: 0 /hpf

## 2014-03-14 MED ORDER — HYDROCODONE-ACETAMINOPHEN 7.5 MG-325 MG TAB
ORAL_TABLET | Freq: Four times a day (QID) | ORAL | Status: DC | PRN
Start: 2014-03-14 — End: 2014-05-01

## 2014-03-14 MED ORDER — AMOXICILLIN CLAVULANATE 875 MG-125 MG TAB
875-125 mg | ORAL_TABLET | Freq: Two times a day (BID) | ORAL | Status: DC
Start: 2014-03-14 — End: 2014-04-22

## 2014-03-14 MED ORDER — ONDANSETRON 4 MG TAB, RAPID DISSOLVE
4 mg | ORAL_TABLET | Freq: Three times a day (TID) | ORAL | Status: DC | PRN
Start: 2014-03-14 — End: 2014-05-18

## 2014-03-14 MED ORDER — OXYCODONE 5 MG TAB
5 mg | ORAL | Status: AC
Start: 2014-03-14 — End: 2014-03-14
  Administered 2014-03-14: 13:00:00 via ORAL

## 2014-03-14 MED ORDER — FLUCONAZOLE 150 MG TAB
150 mg | ORAL_TABLET | ORAL | Status: AC
Start: 2014-03-14 — End: 2014-03-14

## 2014-03-14 MED ORDER — SACCHAROMYCES BOULARDII 250 MG CAP
250 mg | ORAL_CAPSULE | Freq: Two times a day (BID) | ORAL | Status: AC
Start: 2014-03-14 — End: 2014-03-21

## 2014-03-14 MED ORDER — ONDANSETRON 4 MG TAB, RAPID DISSOLVE
4 mg | ORAL | Status: AC
Start: 2014-03-14 — End: 2014-03-14
  Administered 2014-03-14: 13:00:00 via ORAL

## 2014-03-14 MED ORDER — POLYETHYLENE GLYCOL 3350 100 % ORAL POWDER
17 gram/dose | Freq: Every day | ORAL | Status: AC | PRN
Start: 2014-03-14 — End: 2014-03-24

## 2014-03-14 MED FILL — ONDANSETRON 4 MG TAB, RAPID DISSOLVE: 4 mg | ORAL | Qty: 1

## 2014-03-14 MED FILL — OXYCODONE 5 MG TAB: 5 mg | ORAL | Qty: 1

## 2014-03-14 NOTE — ED Notes (Signed)
I have reviewed discharge instructions with the patient.  The patient verbalized understanding. Pt ambulatory to car. Pt home with family.

## 2014-03-14 NOTE — ED Notes (Signed)
Pt returned from Xray, meds administered as documented.

## 2014-03-14 NOTE — ED Notes (Signed)
C/o rt flank pain onset 3 days ago with dysuria.

## 2014-03-14 NOTE — ED Provider Notes (Signed)
HPI Comments: 33 year old female presents emergency department with recurrent right flank pain.  Patient has history of sponge kidney recurrent kidney stones.  Patient states that she had multiple stones in her right kidney at the time of her last CT which was 2 weeks ago.  Was able to review these images via care everywhere.  Patient does have multiple stones was a 6 mm stone in the lower pole of the right kidney.    Patient reports increased pain over the last several days.  No alleviating.  No aggravating.  Sharp pain.  She was on antibiotics for UTI as well Augmentin twice a day.  Unfortunately the patient took the pills once a day instead of twice a day.     Some nausea no vomiting.  She also complains of dysuria.        Patient is a 33 y.o. female presenting with flank pain.   Flank Pain   Pertinent negatives include no fever.        Past Medical History   Diagnosis Date   ??? Postpartum fever 05/09/2008   ??? Anemia of pregnancy 05/09/2008   ??? General conditions of the kidney and ureter      kidney stones   ??? General conditions of the kidney and ureter      hydronephrosis   ??? GERD (gastroesophageal reflux disease)      mild   ??? Previous cesarean delivery, delivered 05/09/2008   ??? Chronic kidney disease      kidney stones        Past Surgical History   Procedure Laterality Date   ??? Pr cesarean delivery only  2001, 2006, 2009   ??? Hx other surgical  201, 2006     lithotripsy x2   ??? Hx other surgical  10/11/09     kidney-stent   ??? Hx other surgical       c-section   ??? Hx tonsillectomy     ??? Hx cesarean section       x4   ??? Hx tubal ligation       Nov 17, 2008   ??? Hx lithotripsy       x5         Family History   Problem Relation Age of Onset   ??? Alcohol abuse Neg Hx    ??? Arthritis-rheumatoid Neg Hx    ??? Asthma Neg Hx    ??? Bleeding Prob Neg Hx    ??? Diabetes Neg Hx    ??? Heart Disease Neg Hx    ??? Migraines Neg Hx    ??? Psychiatric Disorder Neg Hx    ??? Stroke Neg Hx    ??? Elevated Lipids Neg Hx    ??? Headache Neg Hx     ??? Hypertension Neg Hx    ??? Lung Disease Neg Hx    ??? Mental Retardation Neg Hx    ??? Cancer Maternal Aunt         History     Social History   ??? Marital Status: SINGLE     Spouse Name: N/A     Number of Children: N/A   ??? Years of Education: N/A     Occupational History   ??? Not on file.     Social History Main Topics   ??? Smoking status: Never Smoker    ??? Smokeless tobacco: Never Used   ??? Alcohol Use: No   ??? Drug Use: No   ??? Sexual Activity:  Partners: Male     Pharmacist, hospital Protection: Surgical     Other Topics Concern   ??? Not on file     Social History Narrative                  ALLERGIES: Meperidine      Review of Systems   Constitutional: Positive for chills. Negative for fever.   Gastrointestinal: Positive for nausea.   Genitourinary: Positive for flank pain.       Filed Vitals:    03/14/14 0846 03/14/14 0848   BP:  92/59   Pulse:  109   Temp: 98.8 ??F (37.1 ??C)    Resp:  18   Height: 6' (1.829 m)    Weight: 68.947 kg (152 lb)    SpO2:  98%            Physical Exam   Constitutional: She is oriented to person, place, and time. She appears well-developed and well-nourished.   HENT:   Head: Normocephalic and atraumatic.   Eyes: Pupils are equal, round, and reactive to light.   Cardiovascular: Normal rate and regular rhythm.    Pulmonary/Chest: Breath sounds normal. No respiratory distress.   Neurological: She is alert and oriented to person, place, and time.   Skin: Skin is warm and dry.   Psychiatric: She has a normal mood and affect. Her behavior is normal.   Nursing note and vitals reviewed.       MDM  Number of Diagnoses or Management Options  Diagnosis management comments: 33 year old female with right flank pain for several days history of recurrent renal stones.    KUB was performed which reveals multiple stones none in the expected course of the ureter.  Patient's urine is noted to have leukocytes and white blood cells.  No bacteria are identified.     Wonder if she has a recurrence of her urinary tract infection.  We'll start back on antibiotics.  Treat with pain medication and refer her to Dr. Elease Hashimoto her urologist          Procedures

## 2014-03-14 NOTE — Progress Notes (Signed)
I have to go to the ICU for stat feeding tube placement film. Will get ER pt as soon as I am done

## 2014-04-22 ENCOUNTER — Inpatient Hospital Stay
Admit: 2014-04-22 | Discharge: 2014-04-22 | Disposition: A | Discharge status: Home / Self Care | Payer: MEDICAID | Attending: Emergency Medicine

## 2014-04-22 DIAGNOSIS — J069 Acute upper respiratory infection, unspecified: Secondary | ICD-10-CM

## 2014-04-22 MED ORDER — AZITHROMYCIN 250 MG TAB
250 mg | ORAL | Status: AC
Start: 2014-04-22 — End: 2014-04-27

## 2014-04-22 MED ORDER — ALBUTEROL SULFATE HFA 90 MCG/ACTUATION AEROSOL INHALER
90 mcg/actuation | Freq: Four times a day (QID) | RESPIRATORY_TRACT | Status: AC | PRN
Start: 2014-04-22 — End: ?

## 2014-04-22 MED ORDER — IPRATROPIUM-ALBUTEROL 2.5 MG-0.5 MG/3 ML NEB SOLUTION
2.5 mg-0.5 mg/3 ml | RESPIRATORY_TRACT | Status: AC
Start: 2014-04-22 — End: 2014-04-22
  Administered 2014-04-22: 14:00:00 via RESPIRATORY_TRACT

## 2014-04-22 MED ORDER — CODEINE-GUAIFENESIN 10 MG-100 MG/5 ML ORAL LIQUID
100-10 mg/5 mL | Freq: Three times a day (TID) | ORAL | Status: DC | PRN
Start: 2014-04-22 — End: 2014-05-01

## 2014-04-22 MED ORDER — DEXAMETHASONE SODIUM PHOSPHATE 10 MG/ML IJ SOLN
10 mg/mL | INTRAMUSCULAR | Status: AC
Start: 2014-04-22 — End: 2014-04-22
  Administered 2014-04-22: 14:00:00 via ORAL

## 2014-04-22 MED FILL — IPRATROPIUM-ALBUTEROL 2.5 MG-0.5 MG/3 ML NEB SOLUTION: 2.5 mg-0.5 mg/3 ml | RESPIRATORY_TRACT | Qty: 3

## 2014-04-22 MED FILL — DEXAMETHASONE SODIUM PHOSPHATE 10 MG/ML IJ SOLN: 10 mg/mL | INTRAMUSCULAR | Qty: 1

## 2014-04-22 NOTE — ED Notes (Signed)
2 week history of a cold and cough

## 2014-04-22 NOTE — ED Notes (Signed)
I have reviewed discharge instructions with the patient.  The patient verbalized understanding.

## 2014-04-22 NOTE — ED Provider Notes (Signed)
HPI Comments: Patient is here with cough, congestion, chills, and not feeling well over the last 2 weeks and 3 days.  She states her voice about exactly the same but they are well now.  She does have a history of asthma.  She has not felt short of breath or had chest pain.  She has had a nonproductive cough.  No documented fevers.  She is ambulatory to the room in no acute distress.  She is well-hydrated.    Patient is a 33 y.o. female presenting with cough. The history is provided by the patient.   Cough  This is a new problem. The problem occurs every few minutes. The problem has been gradually worsening. The cough is non-productive. Patient reports a subjective fever - was not measured.Associated symptoms include chills and rhinorrhea. Pertinent negatives include no chest pain, no weight loss, no eye redness, no ear congestion, no ear pain, no headaches, no sore throat, no myalgias, no shortness of breath, no wheezing, no nausea, no vomiting and no confusion. Treatments tried: "all them OTC medicines" Her past medical history is significant for asthma. Her past medical history does not include bronchitis, pneumonia, bronchiectasis, COPD, emphysema, cancer, heart failure or CHF.        Past Medical History   Diagnosis Date   ??? Postpartum fever 05/09/2008   ??? Anemia of pregnancy 05/09/2008   ??? General conditions of the kidney and ureter      kidney stones   ??? General conditions of the kidney and ureter      hydronephrosis   ??? GERD (gastroesophageal reflux disease)      mild   ??? Previous cesarean delivery, delivered 05/09/2008   ??? Chronic kidney disease      kidney stones        Past Surgical History   Procedure Laterality Date   ??? Pr cesarean delivery only  2001, 2006, 2009   ??? Hx other surgical  201, 2006     lithotripsy x2   ??? Hx other surgical  10/11/09     kidney-stent   ??? Hx other surgical       c-section   ??? Hx tonsillectomy     ??? Hx cesarean section       x4   ??? Hx tubal ligation       Nov 17, 2008    ??? Hx lithotripsy       x5         Family History   Problem Relation Age of Onset   ??? Alcohol abuse Neg Hx    ??? Arthritis-rheumatoid Neg Hx    ??? Asthma Neg Hx    ??? Bleeding Prob Neg Hx    ??? Diabetes Neg Hx    ??? Heart Disease Neg Hx    ??? Migraines Neg Hx    ??? Psychiatric Disorder Neg Hx    ??? Stroke Neg Hx    ??? Elevated Lipids Neg Hx    ??? Headache Neg Hx    ??? Hypertension Neg Hx    ??? Lung Disease Neg Hx    ??? Mental Retardation Neg Hx    ??? Cancer Maternal Aunt         History     Social History   ??? Marital Status: SINGLE     Spouse Name: N/A     Number of Children: N/A   ??? Years of Education: N/A     Occupational History   ??? Not on file.  Social History Main Topics   ??? Smoking status: Never Smoker    ??? Smokeless tobacco: Never Used   ??? Alcohol Use: No   ??? Drug Use: No   ??? Sexual Activity:     Partners: Male     Pharmacist, hospital Protection: Surgical     Other Topics Concern   ??? Not on file     Social History Narrative                  ALLERGIES: Meperidine      Review of Systems   Constitutional: Positive for chills. Negative for weight loss.   HENT: Positive for rhinorrhea. Negative for ear pain and sore throat.    Eyes: Negative.  Negative for redness.   Respiratory: Positive for cough. Negative for shortness of breath and wheezing.    Cardiovascular: Negative.  Negative for chest pain.   Gastrointestinal: Negative.  Negative for nausea and vomiting.   Genitourinary: Negative.    Musculoskeletal: Negative.  Negative for myalgias.   Skin: Negative.    Neurological: Negative.  Negative for headaches.   Psychiatric/Behavioral: Negative.  Negative for confusion.   All other systems reviewed and are negative.      Filed Vitals:    04/22/14 0917   Pulse: 80   Temp: 99 ??F (37.2 ??C)   Resp: 24   Height: 6' (1.829 m)   Weight: 68.04 kg (150 lb)   SpO2: 100%            Physical Exam   Constitutional: She is oriented to person, place, and time. She appears well-developed and well-nourished.   HENT:    Head: Normocephalic and atraumatic.   Right Ear: External ear normal.   Left Ear: External ear normal.   Mouth/Throat: Oropharynx is clear and moist.   Clear rhinorrhea, bilateral swollen, hyporemic nasal turbinates.   Eyes: Conjunctivae and EOM are normal. Pupils are equal, round, and reactive to light.   Neck: Normal range of motion. Neck supple.   Cardiovascular: Normal rate, regular rhythm, normal heart sounds and intact distal pulses.    Pulmonary/Chest: Effort normal and breath sounds normal.   Abdominal: Soft. Bowel sounds are normal.   Musculoskeletal: Normal range of motion.   Neurological: She is alert and oriented to person, place, and time. She has normal reflexes.   Skin: Skin is warm and dry.   Psychiatric: She has a normal mood and affect. Her behavior is normal. Judgment and thought content normal.   Nursing note and vitals reviewed.       MDM    Procedures    The patient was observed in the ED.  Will give patient a DuoNeb here symptomatically with her coughing to see if it would help and also given a one-time dose of Decadron for the inflammation in her lungs.  We'll send her home with a Z-Pak to cover her sinuses and URI symptoms that have gone on now for almost 2-1/2 weeks.  Plenty of fluids and take the medication as directed.  Note for work today.  I discussed the results of all labs, procedures, radiographs, and treatments with the patient and available family.  Treatment plan is agreed upon and the patient is ready for discharge.  All voiced understanding of the discharge plan and medication instructions or changes as appropriate.  Questions about treatment in the ED were answered.  All were encouraged to return should symptoms worsen or new problems develop.

## 2014-05-01 ENCOUNTER — Inpatient Hospital Stay
Admit: 2014-05-01 | Discharge: 2014-05-01 | Disposition: A | Discharge status: Home / Self Care | Payer: MEDICAID | Attending: Emergency Medicine

## 2014-05-01 DIAGNOSIS — R1031 Right lower quadrant pain: Secondary | ICD-10-CM

## 2014-05-01 LAB — METABOLIC PANEL, COMPREHENSIVE
A-G Ratio: 1 — ABNORMAL LOW (ref 1.2–3.5)
ALT (SGPT): 22 U/L (ref 12–65)
AST (SGOT): 12 U/L — ABNORMAL LOW (ref 15–37)
Albumin: 3.6 g/dL (ref 3.5–5.0)
Alk. phosphatase: 41 U/L — ABNORMAL LOW (ref 50–136)
Anion gap: 9 mmol/L (ref 7–16)
BUN: 15 MG/DL (ref 6–23)
Bilirubin, total: 0.3 MG/DL (ref 0.2–1.1)
CO2: 27 mmol/L (ref 21–32)
Calcium: 8.8 MG/DL (ref 8.3–10.4)
Chloride: 107 mmol/L (ref 98–107)
Creatinine: 0.9 MG/DL (ref 0.6–1.0)
GFR est AA: >60 mL/min/{1.73_m2} (ref >60)
GFR est non-AA: >60 mL/min/{1.73_m2} (ref >60)
Globulin: 3.6 g/dL — ABNORMAL HIGH (ref 2.3–3.5)
Glucose: 81 mg/dL (ref 65–100)
Potassium: 4 mmol/L (ref 3.5–5.1)
Protein, total: 7.2 g/dL (ref 6.3–8.2)
Sodium: 143 mmol/L (ref 136–145)

## 2014-05-01 LAB — CBC W/O DIFF
HCT: 35.6 % — ABNORMAL LOW (ref 35.8–46.3)
HGB: 11.5 g/dL — ABNORMAL LOW (ref 11.7–15.4)
MCH: 30.2 PG (ref 26.1–32.9)
MCHC: 32.3 g/dL (ref 31.4–35.0)
MCV: 93.4 FL (ref 79.6–97.8)
MPV: 9.5 FL — ABNORMAL LOW (ref 10.8–14.1)
PLATELET: 227 10*3/uL (ref 150–450)
RBC: 3.81 M/uL — ABNORMAL LOW (ref 4.05–5.25)
RDW: 13.9 % (ref 11.9–14.6)
WBC: 3.6 10*3/uL — ABNORMAL LOW (ref 4.3–11.1)

## 2014-05-01 LAB — URINE MICROSCOPIC: Bacteria: 0 /hpf

## 2014-05-01 LAB — HCG URINE, QL. - POC: Pregnancy test,urine (POC): NEGATIVE

## 2014-05-01 MED ORDER — ONDANSETRON (PF) 4 MG/2 ML INJECTION
4 mg/2 mL | INTRAMUSCULAR | Status: AC
Start: 2014-05-01 — End: 2014-05-01
  Administered 2014-05-01: 14:00:00 via INTRAVENOUS

## 2014-05-01 MED ORDER — TRIMETHOPRIM-SULFAMETHOXAZOLE 160 MG-800 MG TAB
160-800 mg | ORAL_TABLET | Freq: Two times a day (BID) | ORAL | Status: AC
Start: 2014-05-01 — End: 2014-05-08

## 2014-05-01 MED ORDER — HYDROMORPHONE (PF) 1 MG/ML IJ SOLN
1 mg/mL | INTRAMUSCULAR | Status: AC
Start: 2014-05-01 — End: 2014-05-01
  Administered 2014-05-01: 14:00:00 via INTRAVENOUS

## 2014-05-01 MED ORDER — HYDROCODONE-ACETAMINOPHEN 5 MG-325 MG TAB
5-325 mg | ORAL_TABLET | ORAL | Status: DC | PRN
Start: 2014-05-01 — End: 2014-05-18

## 2014-05-01 MED ORDER — SODIUM CHLORIDE 0.9 % IV
INTRAVENOUS | Status: DC
Start: 2014-05-01 — End: 2014-05-01
  Administered 2014-05-01: 14:00:00 via INTRAVENOUS

## 2014-05-01 MED FILL — ONDANSETRON (PF) 4 MG/2 ML INJECTION: 4 mg/2 mL | INTRAMUSCULAR | Qty: 2

## 2014-05-01 MED FILL — HYDROMORPHONE (PF) 1 MG/ML IJ SOLN: 1 mg/mL | INTRAMUSCULAR | Qty: 1

## 2014-05-01 NOTE — ED Notes (Signed)
Pt c/o right sided flank pain attributed to kidney stones. Pt also states that she has been "bleeding when I pee" for the past four days.

## 2014-05-01 NOTE — ED Provider Notes (Addendum)
HPI Comments: 33 year old female presents with 4 day history of right flank into right groin pain.  She reports that she's had mild fever and chills at home as well as she vomited 3 times in the last 2 days and she had 4 episodes of diarrhea yesterday.  She has known kidney stones.  She has one that is 5 mm plus on the right as well as several bilaterally that are "small enough to pass".  Patient has had 5 lithotripsies she had a cystoscopy I believe she said back in April.  Presents now for pain control and evaluation for possible urine infection.  She has had frequent urination dysuria and some hematuria as well over the last 4 days.  She refuses offer for CT scan and she says she's had some many in the past.    Patient is a 33 y.o. female presenting with flank pain. The history is provided by the patient. No language interpreter was used.   Flank Pain   This is a new problem. The current episode started more than 2 days ago. The problem has not changed since onset.The problem occurs constantly. Patient reports not work related injury.The pain is associated with no known injury. The pain is present in the right side. The quality of the pain is described as stabbing. The pain radiates to the right groin. The pain is at a severity of 6/10. The pain is moderate. Associated symptoms include a fever, abdominal pain and dysuria. Pertinent negatives include no chest pain, no numbness, no weight loss, no headaches, no abdominal swelling, no bowel incontinence, no perianal numbness, no bladder incontinence, no pelvic pain, no leg pain, no paresthesias, no paresis, no tingling and no weakness.        Past Medical History   Diagnosis Date   ??? Postpartum fever 05/09/2008   ??? Anemia of pregnancy 05/09/2008   ??? General conditions of the kidney and ureter      kidney stones   ??? General conditions of the kidney and ureter      hydronephrosis   ??? GERD (gastroesophageal reflux disease)      mild    ??? Previous cesarean delivery, delivered 05/09/2008   ??? Chronic kidney disease      kidney stones        Past Surgical History   Procedure Laterality Date   ??? Pr cesarean delivery only  2001, 2006, 2009   ??? Hx other surgical  201, 2006     lithotripsy x2   ??? Hx other surgical  10/11/09     kidney-stent   ??? Hx other surgical       c-section   ??? Hx tonsillectomy     ??? Hx cesarean section       x4   ??? Hx tubal ligation       Nov 17, 2008   ??? Hx lithotripsy       x5         Family History   Problem Relation Age of Onset   ??? Alcohol abuse Neg Hx    ??? Arthritis-rheumatoid Neg Hx    ??? Asthma Neg Hx    ??? Bleeding Prob Neg Hx    ??? Diabetes Neg Hx    ??? Heart Disease Neg Hx    ??? Migraines Neg Hx    ??? Psychiatric Disorder Neg Hx    ??? Stroke Neg Hx    ??? Elevated Lipids Neg Hx    ??? Headache Neg Hx    ???  Hypertension Neg Hx    ??? Lung Disease Neg Hx    ??? Mental Retardation Neg Hx    ??? Cancer Maternal Aunt         History     Social History   ??? Marital Status: SINGLE     Spouse Name: N/A     Number of Children: N/A   ??? Years of Education: N/A     Occupational History   ??? Not on file.     Social History Main Topics   ??? Smoking status: Never Smoker    ??? Smokeless tobacco: Never Used   ??? Alcohol Use: No   ??? Drug Use: No   ??? Sexual Activity:     Partners: Male     Pharmacist, hospital Protection: Surgical     Other Topics Concern   ??? Not on file     Social History Narrative                  ALLERGIES: Meperidine      Review of Systems   Constitutional: Positive for fever and chills. Negative for weight loss.   HENT: Negative for facial swelling and nosebleeds.    Eyes: Negative for discharge and itching.   Respiratory: Negative for cough and shortness of breath.    Cardiovascular: Negative for chest pain and palpitations.   Gastrointestinal: Positive for nausea, vomiting (3 episodes of vomiting in the last 2 days), abdominal pain and diarrhea (4 episodes of diarrhea yesterday). Negative for bowel incontinence.    Endocrine: Negative for cold intolerance and heat intolerance.   Genitourinary: Positive for dysuria, hematuria, flank pain and difficulty urinating. Negative for bladder incontinence, vaginal bleeding (last period began on October 1 was regular and on time.  Patient has had bilateral tubal ligation), vaginal discharge, vaginal pain and pelvic pain.   Musculoskeletal: Positive for back pain (right flank pain). Negative for neck pain and neck stiffness.   Skin: Negative for rash and wound.   Neurological: Negative for tingling, facial asymmetry, weakness, numbness, headaches and paresthesias.   Psychiatric/Behavioral: Negative for confusion and decreased concentration.       Filed Vitals:    05/01/14 0939   BP: 107/54   Pulse: 82   Temp: 98.1 ??F (36.7 ??C)   Resp: 18   Height: 6' (1.829 m)   Weight: 68.04 kg (150 lb)   SpO2: 100%            Physical Exam   Constitutional: She is oriented to person, place, and time. She appears well-developed and well-nourished. No distress.   HENT:   Head: Normocephalic and atraumatic.   Right Ear: External ear normal.   Left Ear: External ear normal.   Nose: Nose normal.   Eyes: Conjunctivae and EOM are normal. Pupils are equal, round, and reactive to light.   Neck: Normal range of motion. Neck supple.   Cardiovascular: Normal rate, regular rhythm and normal heart sounds.    Pulmonary/Chest: Effort normal and breath sounds normal. No respiratory distress. She has no wheezes.   Abdominal: Soft. Bowel sounds are normal. She exhibits no distension. There is tenderness.   Right CVA tenderness.  Pain is suprapubic and right lower quadrant region.  Worse with palpation   Musculoskeletal: Normal range of motion. She exhibits no edema or tenderness.   Neurological: She is alert and oriented to person, place, and time. No cranial nerve deficit. Coordination normal.   Skin: Skin is warm and dry. No rash noted.  Psychiatric: She has a normal mood and affect. Her behavior is normal.  Judgment and thought content normal.   Nursing note and vitals reviewed.       MDM  Number of Diagnoses or Management Options  Diagnosis management comments: 33 year old with  Urinary symptoms presents with right flank and right lower quadrant pain onset 4 days ago. KUB from 03/14/14 does reveal multiple stones on the right.  Patient has refused offer for CT at this time.  We will run a urine micro, baseline blood work, provide IV fluids as well as provide for pain and nausea control here in the department.  Patient does have a urologist who she will be following with  11:09 AM  Feeling better at present time, still with some nausea but better.  Iv fluids infused, meds in.   Will follow up with her urologist for long term plan.  Urine with 5-10 wbc and 10-20 rbc, bun and cr good.  Dc home with abx       Amount and/or Complexity of Data Reviewed  Clinical lab tests: ordered and reviewed  Discuss the patient with other providers: yes ([place])    Risk of Complications, Morbidity, and/or Mortality  Presenting problems: minimal  Diagnostic procedures: low  Management options: low    Patient Progress  Patient progress: improved      Procedures

## 2014-05-01 NOTE — ED Notes (Signed)
Pt discharged per physician's orders. Discharge instructions given to patient.  verbalized understanding. Pt instructed not to drive under the influence of Norco. Instructed to schedule follow up appt. with primary care provider.

## 2014-05-18 ENCOUNTER — Emergency Department: Admit: 2014-05-18 | Payer: MEDICAID | Primary: Family Medicine

## 2014-05-18 ENCOUNTER — Inpatient Hospital Stay
Admit: 2014-05-18 | Discharge: 2014-05-18 | Disposition: A | Discharge status: Home / Self Care | Payer: MEDICAID | Attending: Emergency Medicine

## 2014-05-18 DIAGNOSIS — N8320 Unspecified ovarian cysts: Secondary | ICD-10-CM

## 2014-05-18 LAB — METABOLIC PANEL, BASIC
Anion gap: 6 mmol/L — ABNORMAL LOW (ref 7–16)
BUN: 13 MG/DL (ref 6–23)
CO2: 28 mmol/L (ref 21–32)
Calcium: 8.8 MG/DL (ref 8.3–10.4)
Chloride: 107 mmol/L (ref 98–107)
Creatinine: 0.8 MG/DL (ref 0.6–1.0)
GFR est AA: >60 mL/min/{1.73_m2} (ref >60)
GFR est non-AA: >60 mL/min/{1.73_m2} (ref >60)
Glucose: 83 mg/dL (ref 65–100)
Potassium: 4.6 mmol/L (ref 3.5–5.1)
Sodium: 141 mmol/L (ref 136–145)

## 2014-05-18 LAB — CBC W/O DIFF
HCT: 35 % — ABNORMAL LOW (ref 35.8–46.3)
HGB: 11.1 g/dL — ABNORMAL LOW (ref 11.7–15.4)
MCH: 30 PG (ref 26.1–32.9)
MCHC: 31.7 g/dL (ref 31.4–35.0)
MCV: 94.6 FL (ref 79.6–97.8)
MPV: 9.5 FL — ABNORMAL LOW (ref 10.8–14.1)
PLATELET: 231 10*3/uL (ref 150–450)
RBC: 3.7 M/uL — ABNORMAL LOW (ref 4.05–5.25)
RDW: 14.2 % (ref 11.9–14.6)
WBC: 5.7 10*3/uL (ref 4.3–11.1)

## 2014-05-18 LAB — HCG URINE, QL. - POC: Pregnancy test,urine (POC): NEGATIVE

## 2014-05-18 MED ORDER — KETOROLAC TROMETHAMINE 30 MG/ML INJECTION
30 mg/mL (1 mL) | INTRAMUSCULAR | Status: AC
Start: 2014-05-18 — End: 2014-05-18
  Administered 2014-05-18: 20:00:00 via INTRAVENOUS

## 2014-05-18 MED ORDER — SODIUM CHLORIDE 0.9 % IV
INTRAVENOUS | Status: DC
Start: 2014-05-18 — End: 2014-05-18
  Administered 2014-05-18: 17:00:00 via INTRAVENOUS

## 2014-05-18 MED ORDER — ONDANSETRON (PF) 4 MG/2 ML INJECTION
4 mg/2 mL | INTRAMUSCULAR | Status: AC
Start: 2014-05-18 — End: 2014-05-18
  Administered 2014-05-18: 17:00:00 via INTRAVENOUS

## 2014-05-18 MED ORDER — HYDROMORPHONE (PF) 1 MG/ML IJ SOLN
1 mg/mL | INTRAMUSCULAR | Status: AC
Start: 2014-05-18 — End: 2014-05-18
  Administered 2014-05-18: 17:00:00 via INTRAVENOUS

## 2014-05-18 MED ORDER — TRAMADOL 50 MG TAB
50 mg | ORAL_TABLET | Freq: Four times a day (QID) | ORAL | Status: DC | PRN
Start: 2014-05-18 — End: 2014-06-30

## 2014-05-18 MED FILL — HYDROMORPHONE (PF) 1 MG/ML IJ SOLN: 1 mg/mL | INTRAMUSCULAR | Qty: 1

## 2014-05-18 MED FILL — ONDANSETRON (PF) 4 MG/2 ML INJECTION: 4 mg/2 mL | INTRAMUSCULAR | Qty: 2

## 2014-05-18 MED FILL — KETOROLAC TROMETHAMINE 30 MG/ML INJECTION: 30 mg/mL (1 mL) | INTRAMUSCULAR | Qty: 1

## 2014-05-18 NOTE — ED Notes (Signed)
[name] np in to see pt

## 2014-05-18 NOTE — ED Provider Notes (Addendum)
HPI Comments: 33 y/o f to ed for eval of hematuria, left flank and abd pain.  Onset last couple of days.  Patient had CT in April which showed multiple kidney stones in bilateral kidneys.  She presents today with left flank and left lower abdominal pain as well as episodes of vomiting some fever and chills.  She is on her regular menstrual cycle is same time.  Of note she has blood noted in her urine specimen but it was not calf.  She appears uncomfortable at present time.    Patient is a 33 y.o. female presenting with hematuria. The history is provided by the patient. No language interpreter was used.   Blood in Urine   This is a new problem. The current episode started yesterday. The problem occurs every urination. The problem has been gradually worsening. The quality of the pain is described as aching. The pain is moderate. Patient reports a subjective fever - was not measured.Associated symptoms include chills, sweats (with vomiting episodes), nausea, vomiting, hematuria, hesitancy, urgency, flank pain and abdominal pain (left side). Pertinent negatives include no discharge, no frequency, no vaginal discharge and no back pain. Her past medical history is significant for kidney stones.                                    Past Medical History   Diagnosis Date   ??? Postpartum fever 05/09/2008   ??? Anemia of pregnancy 05/09/2008   ??? General conditions of the kidney and ureter      kidney stones   ??? General conditions of the kidney and ureter      hydronephrosis   ??? GERD (gastroesophageal reflux disease)      mild   ??? Previous cesarean delivery, delivered 05/09/2008   ??? Chronic kidney disease      kidney stones   ;     Past Surgical History   Procedure Laterality Date   ??? Pr cesarean delivery only  2001, 2006, 2009   ??? Hx other surgical  201, 2006     lithotripsy x2   ??? Hx other surgical  10/11/09     kidney-stent   ??? Hx other surgical       c-section   ??? Hx tonsillectomy     ??? Hx cesarean section       x4    ??? Hx tubal ligation       Nov 17, 2008   ??? Hx lithotripsy       x5         Family History   Problem Relation Age of Onset   ??? Alcohol abuse Neg Hx    ??? Arthritis-rheumatoid Neg Hx    ??? Asthma Neg Hx    ??? Bleeding Prob Neg Hx    ??? Diabetes Neg Hx    ??? Heart Disease Neg Hx    ??? Migraines Neg Hx    ??? Psychiatric Disorder Neg Hx    ??? Stroke Neg Hx    ??? Elevated Lipids Neg Hx    ??? Headache Neg Hx    ??? Hypertension Neg Hx    ??? Lung Disease Neg Hx    ??? Mental Retardation Neg Hx    ??? Cancer Maternal Aunt         History     Social History   ??? Marital Status: SINGLE     Spouse Name:  N/A     Number of Children: N/A   ??? Years of Education: N/A     Occupational History   ??? Not on file.     Social History Main Topics   ??? Smoking status: Never Smoker    ??? Smokeless tobacco: Never Used   ??? Alcohol Use: No   ??? Drug Use: No   ??? Sexual Activity:     Partners: Male     Pharmacist, hospitalBirth Control/ Protection: Surgical     Other Topics Concern   ??? Not on file     Social History Narrative                  ALLERGIES: Meperidine      Review of Systems   Constitutional: Positive for fever and chills.   HENT: Negative for facial swelling and nosebleeds.    Eyes: Negative for discharge and itching.   Respiratory: Negative for cough and shortness of breath.    Cardiovascular: Negative for chest pain and palpitations.   Gastrointestinal: Positive for nausea, vomiting and abdominal pain (left side). Negative for diarrhea.   Endocrine: Negative for cold intolerance and heat intolerance.   Genitourinary: Positive for hesitancy, urgency, hematuria, flank pain and menstrual problem (on menses now). Negative for frequency and vaginal discharge.   Musculoskeletal: Negative for back pain, neck pain and neck stiffness.   Skin: Negative for rash and wound.   Neurological: Negative for dizziness and headaches.   Psychiatric/Behavioral: Negative for confusion and decreased concentration.       Filed Vitals:    05/18/14 1107   BP: 101/54   Pulse: 79    Temp: 98.6 ??F (37 ??C)   Resp: 16   Height: 6' (1.829 m)   Weight: 68.947 kg (152 lb)            Physical Exam   Constitutional: She is oriented to person, place, and time. She appears well-developed and well-nourished. No distress.   HENT:   Head: Normocephalic and atraumatic.   Right Ear: External ear normal.   Left Ear: External ear normal.   Nose: Nose normal.   Eyes: Conjunctivae and EOM are normal. Pupils are equal, round, and reactive to light.   Neck: Normal range of motion. Neck supple.   Cardiovascular: Normal rate, regular rhythm and normal heart sounds.    Pulmonary/Chest: Effort normal and breath sounds normal. No respiratory distress. She has no wheezes.   Abdominal: Soft. Bowel sounds are normal. She exhibits no distension. There is tenderness (left lower and middle region tender with palpation).   Left cva tenderness   Musculoskeletal: Normal range of motion. She exhibits no edema or tenderness.   Neurological: She is alert and oriented to person, place, and time. No cranial nerve deficit. Coordination normal.   Skin: Skin is warm and dry. No rash noted.   Psychiatric: She has a normal mood and affect. Her behavior is normal. Judgment and thought content normal.   Nursing note and vitals reviewed.       MDM  Number of Diagnoses or Management Options  Diagnosis management comments: 33 y/o with known hsx of renal calculi presents with left side/flank/abd pain along with hematuria.  Will eval ct uro, provide for pain and nausea control  12:20 PM  Discussed findings of ct with pt.  Her pain is controllled now.  She agrees to pelvic us to eval ovary.  3:09 PM  Left side ovarian cyst - doppler flow present at periph.  Discussed  with pt - she has meloxicam at thome.  Will dc home with tramadol, follow with family md as neededc       Amount and/or Complexity of Data Reviewed  Clinical lab tests: ordered and reviewed  Tests in the radiology section of CPT??: ordered and reviewed   Discuss the patient with other providers: yes Manson Passey  )    Risk of Complications, Morbidity, and/or Mortality  Presenting problems: minimal  Diagnostic procedures: low  Management options: low    Patient Progress  Patient progress: improved                              Procedures

## 2014-05-18 NOTE — ED Notes (Signed)
To US

## 2014-05-18 NOTE — ED Notes (Signed)
I have reviewed discharge instructions with the patient.  The patient verbalized understanding.

## 2014-05-18 NOTE — ED Notes (Signed)
Returned from CT states pain is better

## 2014-05-18 NOTE — ED Notes (Signed)
Blood in urine x 1 wk hx kidney stones. Also having period onset yesterday

## 2014-06-30 ENCOUNTER — Emergency Department: Admit: 2014-06-30 | Payer: MEDICAID | Primary: Family Medicine

## 2014-06-30 ENCOUNTER — Inpatient Hospital Stay
Admit: 2014-06-30 | Discharge: 2014-06-30 | Disposition: A | Discharge status: Home / Self Care | Payer: MEDICAID | Attending: Emergency Medicine

## 2014-06-30 DIAGNOSIS — N23 Unspecified renal colic: Secondary | ICD-10-CM

## 2014-06-30 LAB — METABOLIC PANEL, BASIC
Anion gap: 7 mmol/L (ref 7–16)
BUN: 9 MG/DL (ref 6–23)
CO2: 29 mmol/L (ref 21–32)
Calcium: 8.2 MG/DL — ABNORMAL LOW (ref 8.3–10.4)
Chloride: 108 mmol/L — ABNORMAL HIGH (ref 98–107)
Creatinine: 0.79 MG/DL (ref 0.6–1.0)
GFR est AA: >60 mL/min/{1.73_m2} (ref >60)
GFR est non-AA: >60 mL/min/{1.73_m2} (ref >60)
Glucose: 82 mg/dL (ref 65–100)
Potassium: 3.9 mmol/L (ref 3.5–5.1)
Sodium: 144 mmol/L (ref 136–145)

## 2014-06-30 LAB — CBC W/O DIFF
HCT: 34.4 % — ABNORMAL LOW (ref 35.8–46.3)
HGB: 11.3 g/dL — ABNORMAL LOW (ref 11.7–15.4)
MCH: 30.4 PG (ref 26.1–32.9)
MCHC: 32.8 g/dL (ref 31.4–35.0)
MCV: 92.5 FL (ref 79.6–97.8)
MPV: 9.8 FL — ABNORMAL LOW (ref 10.8–14.1)
PLATELET: 199 10*3/uL (ref 150–450)
RBC: 3.72 M/uL — ABNORMAL LOW (ref 4.05–5.25)
RDW: 14.3 % (ref 11.9–14.6)
WBC: 3.8 10*3/uL — ABNORMAL LOW (ref 4.3–11.1)

## 2014-06-30 LAB — URINE MICROSCOPIC
Bacteria: 0 /hpf
RBC: >100 /hpf — ABNORMAL HIGH

## 2014-06-30 LAB — HCG URINE, QL. - POC: Pregnancy test,urine (POC): NEGATIVE

## 2014-06-30 MED ORDER — SODIUM CHLORIDE 0.9% BOLUS IV
0.9 % | Freq: Once | INTRAVENOUS | Status: AC
Start: 2014-06-30 — End: 2014-06-30
  Administered 2014-06-30: 17:00:00 via INTRAVENOUS

## 2014-06-30 MED ORDER — NAPROXEN 500 MG EC TAB, DELAYED RELEASE
500 mg | ORAL_TABLET | Freq: Two times a day (BID) | ORAL | Status: DC
Start: 2014-06-30 — End: 2014-11-12

## 2014-06-30 MED ORDER — ONDANSETRON (PF) 4 MG/2 ML INJECTION
4 mg/2 mL | INTRAMUSCULAR | Status: AC
Start: 2014-06-30 — End: 2014-06-30
  Administered 2014-06-30: 17:00:00 via INTRAVENOUS

## 2014-06-30 MED ORDER — TRAMADOL 50 MG TAB
50 mg | ORAL_TABLET | Freq: Four times a day (QID) | ORAL | Status: DC | PRN
Start: 2014-06-30 — End: 2014-09-14

## 2014-06-30 MED ORDER — METHOCARBAMOL 750 MG TAB
750 mg | ORAL_TABLET | Freq: Four times a day (QID) | ORAL | Status: DC
Start: 2014-06-30 — End: 2014-11-12

## 2014-06-30 MED ORDER — KETOROLAC TROMETHAMINE 30 MG/ML INJECTION
30 mg/mL (1 mL) | INTRAMUSCULAR | Status: AC
Start: 2014-06-30 — End: 2014-06-30
  Administered 2014-06-30: 17:00:00 via INTRAVENOUS

## 2014-06-30 MED FILL — ONDANSETRON (PF) 4 MG/2 ML INJECTION: 4 mg/2 mL | INTRAMUSCULAR | Qty: 2

## 2014-06-30 MED FILL — KETOROLAC TROMETHAMINE 30 MG/ML INJECTION: 30 mg/mL (1 mL) | INTRAMUSCULAR | Qty: 1

## 2014-06-30 NOTE — ED Provider Notes (Signed)
Patient is a 33 y.o. female presenting with flank pain. The history is provided by the patient.   Flank Pain   This is a recurrent problem. The current episode started more than 2 days ago. The problem has been gradually worsening. The problem occurs constantly. Patient reports not work related injury.The pain is associated with no known injury. The pain is present in the lower back and middle back. The quality of the pain is described as dull and stabbing. The pain does not radiate. The pain is moderate. Exacerbated by: no specific trigger for pain. The pain is the same all the time. Pertinent negatives include no chest pain, no fever, no headaches, no abdominal pain, no abdominal swelling and no dysuria. Associated symptoms comments: hematuria. She has tried nothing for the symptoms. Risk factors include history of kidney stones.        Past Medical History:   Diagnosis Date   ??? Postpartum fever 05/09/2008   ??? Anemia of pregnancy 05/09/2008   ??? General conditions of the kidney and ureter      kidney stones   ??? General conditions of the kidney and ureter      hydronephrosis   ??? GERD (gastroesophageal reflux disease)      mild   ??? Previous cesarean delivery, delivered 05/09/2008   ??? Chronic kidney disease      kidney stones       Past Surgical History:   Procedure Laterality Date   ??? Pr cesarean delivery only  2001, 2006, 2009   ??? Hx other surgical  201, 2006     lithotripsy x2   ??? Hx other surgical  10/11/09     kidney-stent   ??? Hx other surgical       c-section   ??? Hx tonsillectomy     ??? Hx cesarean section       x4   ??? Hx tubal ligation       Nov 17, 2008   ??? Hx lithotripsy       x5         Family History:   Problem Relation Age of Onset   ??? Alcohol abuse Neg Hx    ??? Arthritis-rheumatoid Neg Hx    ??? Asthma Neg Hx    ??? Bleeding Prob Neg Hx    ??? Diabetes Neg Hx    ??? Heart Disease Neg Hx    ??? Migraines Neg Hx    ??? Psychiatric Disorder Neg Hx    ??? Stroke Neg Hx    ??? Elevated Lipids Neg Hx    ??? Headache Neg Hx     ??? Hypertension Neg Hx    ??? Lung Disease Neg Hx    ??? Mental Retardation Neg Hx    ??? Cancer Maternal Aunt        History     Social History   ??? Marital Status: SINGLE     Spouse Name: N/A     Number of Children: N/A   ??? Years of Education: N/A     Occupational History   ??? Not on file.     Social History Main Topics   ??? Smoking status: Never Smoker    ??? Smokeless tobacco: Never Used   ??? Alcohol Use: No   ??? Drug Use: No   ??? Sexual Activity:     Partners: Male     Pharmacist, hospitalBirth Control/ Protection: Surgical     Other Topics Concern   ??? Not on file  Social History Narrative                ALLERGIES: Meperidine      Review of Systems   Constitutional: Negative for fever and chills.   HENT: Negative for congestion, ear pain and rhinorrhea.    Eyes: Negative for photophobia and discharge.   Respiratory: Negative for cough and shortness of breath.    Cardiovascular: Negative for chest pain and palpitations.   Gastrointestinal: Negative for vomiting, abdominal pain, diarrhea and constipation.   Endocrine: Negative for cold intolerance and heat intolerance.   Genitourinary: Positive for flank pain. Negative for dysuria.   Musculoskeletal: Negative for myalgias, arthralgias and neck pain.   Skin: Negative for rash and wound.   Allergic/Immunologic: Negative for environmental allergies and food allergies.   Neurological: Negative for syncope and headaches.   Hematological: Negative for adenopathy. Does not bruise/bleed easily.   Psychiatric/Behavioral: Negative for dysphoric mood. The patient is not nervous/anxious.    All other systems reviewed and are negative.      Filed Vitals:    06/30/14 1058   BP: 93/53   Pulse: 122   Temp: 98 ??F (36.7 ??C)   Resp: 20   Height: 6' (1.829 m)   Weight: 71.215 kg (157 lb)            Physical Exam   Constitutional: She is oriented to person, place, and time. She appears well-developed and well-nourished. She appears distressed.   HENT:   Head: Normocephalic and atraumatic.    Mouth/Throat: Oropharynx is clear and moist.   Eyes: Conjunctivae and EOM are normal. Pupils are equal, round, and reactive to light. Right eye exhibits no discharge. Left eye exhibits no discharge. No scleral icterus.   Neck: Normal range of motion. Neck supple. No thyromegaly present.   Cardiovascular: Normal rate, regular rhythm, normal heart sounds and intact distal pulses.  Exam reveals no gallop and no friction rub.    No murmur heard.  Pulmonary/Chest: Effort normal and breath sounds normal. No respiratory distress. She has no wheezes. She exhibits no tenderness.   Abdominal: Soft. Bowel sounds are normal. She exhibits no distension. There is no tenderness. There is no rebound and no guarding.   Musculoskeletal: Normal range of motion. She exhibits no edema or tenderness.        Back:    Neurological: She is alert and oriented to person, place, and time. No cranial nerve deficit. She exhibits normal muscle tone.   Skin: Skin is warm. No rash noted. She is not diaphoretic. No erythema.   Psychiatric: She has a normal mood and affect. Her behavior is normal. Judgment and thought content normal.   Nursing note and vitals reviewed.       MDM  Number of Diagnoses or Management Options  Renal colic: new and requires workup     Amount and/or Complexity of Data Reviewed  Clinical lab tests: ordered and reviewed  Tests in the radiology section of CPT??: ordered and reviewed  Tests in the medicine section of CPT??: ordered and reviewed  Review and summarize past medical records: yes    Risk of Complications, Morbidity, and/or Mortality  Presenting problems: moderate  Diagnostic procedures: moderate  Management options: moderate  General comments: Elements of this note have been dictated via voice recognition software.  Text and phrases may be limited by the accuracy of the software.  The chart has been reviewed, but errors may still be present.      Patient Progress  Patient progress: improved      Procedures

## 2014-06-30 NOTE — ED Notes (Signed)
Pt states that she has right sided flank pain. Pain has been constant for the past four days. Pain is sharp and constant. Pt has taken Aleve and ibuprofen, but the pain has not gone down at all.

## 2014-06-30 NOTE — ED Notes (Signed)
3 day history of flank pain states similar to stone pain

## 2014-06-30 NOTE — ED Notes (Signed)
Pt discharged per physician's orders. Discharge instructions given to patient. verbalized understanding. Pt instructed not to drive under the influence of Ultram. Instructed to schedule follow up appt. with primary care provider if symptoms do not improve or worsen.

## 2014-09-14 ENCOUNTER — Inpatient Hospital Stay
Admit: 2014-09-14 | Discharge: 2014-09-14 | Disposition: A | Discharge status: Home / Self Care | Payer: MEDICAID | Attending: Emergency Medicine

## 2014-09-14 DIAGNOSIS — J209 Acute bronchitis, unspecified: Secondary | ICD-10-CM

## 2014-09-14 LAB — HCG URINE, QL. - POC: Pregnancy test,urine (POC): NEGATIVE

## 2014-09-14 MED ORDER — TRAMADOL 50 MG TAB
50 mg | ORAL_TABLET | Freq: Three times a day (TID) | ORAL | Status: DC | PRN
Start: 2014-09-14 — End: 2014-12-05

## 2014-09-14 MED ORDER — AMOXICILLIN 875 MG TAB
875 mg | ORAL_TABLET | Freq: Two times a day (BID) | ORAL | Status: AC
Start: 2014-09-14 — End: 2014-09-24

## 2014-09-14 NOTE — ED Provider Notes (Signed)
Patient is a 34 y.o. female presenting with flank pain and cough. The history is provided by the patient.   Flank Pain   This is a recurrent problem. The current episode started 2 days ago. The problem has not changed since onset.The problem occurs constantly. Patient reports not work related injury.The pain is associated with no known injury. The pain is present in the left side. The quality of the pain is described as aching and similar to previous episodes. The pain does not radiate. The pain is at a severity of 7/10. The pain is mild. The pain is the same all the time. Pertinent negatives include no dysuria, no leg pain, no paresis, no tingling and no weakness. She has tried nothing for the symptoms. The treatment provided no relief. Risk factors include history of kidney stones (renal stones).   Cough  This is a new problem. The current episode started 2 days ago. The problem occurs constantly. The problem has been gradually worsening. The cough is productive of sputum. There has been no fever. Pertinent negatives include no wheezing, no nausea and no vomiting. She has tried nothing for the symptoms. The treatment provided no relief. She is a smoker. Her past medical history does not include pneumonia or asthma.        Past Medical History:   Diagnosis Date   ??? Postpartum fever 05/09/2008   ??? Anemia of pregnancy 05/09/2008   ??? General conditions of the kidney and ureter      kidney stones   ??? General conditions of the kidney and ureter      hydronephrosis   ??? GERD (gastroesophageal reflux disease)      mild   ??? Previous cesarean delivery, delivered 05/09/2008   ??? Chronic kidney disease      kidney stones       Past Surgical History:   Procedure Laterality Date   ??? Pr cesarean delivery only  2001, 2006, 2009   ??? Hx other surgical  201, 2006     lithotripsy x2   ??? Hx other surgical  10/11/09     kidney-stent   ??? Hx other surgical       c-section   ??? Hx tonsillectomy     ??? Hx cesarean section       x4    ??? Hx tubal ligation       Nov 17, 2008   ??? Hx lithotripsy       x5         Family History:   Problem Relation Age of Onset   ??? Alcohol abuse Neg Hx    ??? Arthritis-rheumatoid Neg Hx    ??? Asthma Neg Hx    ??? Bleeding Prob Neg Hx    ??? Diabetes Neg Hx    ??? Heart Disease Neg Hx    ??? Migraines Neg Hx    ??? Psychiatric Disorder Neg Hx    ??? Stroke Neg Hx    ??? Elevated Lipids Neg Hx    ??? Headache Neg Hx    ??? Hypertension Neg Hx    ??? Lung Disease Neg Hx    ??? Mental Retardation Neg Hx    ??? Cancer Maternal Aunt        History     Social History   ??? Marital Status: SINGLE     Spouse Name: N/A   ??? Number of Children: N/A   ??? Years of Education: N/A     Occupational History   ???  Not on file.     Social History Main Topics   ??? Smoking status: Never Smoker    ??? Smokeless tobacco: Never Used   ??? Alcohol Use: No   ??? Drug Use: No   ??? Sexual Activity:     Partners: Male     Pharmacist, hospital Protection: Surgical     Other Topics Concern   ??? Not on file     Social History Narrative           ALLERGIES: Meperidine      Review of Systems   Respiratory: Positive for cough. Negative for wheezing.    Gastrointestinal: Negative for nausea and vomiting.   Genitourinary: Positive for flank pain. Negative for dysuria.   Neurological: Negative for tingling and weakness.   All other systems reviewed and are negative.      Filed Vitals:    09/14/14 0856 09/14/14 0906   BP: 111/61    Pulse: 64    Temp: 98 ??F (36.7 ??C)    Resp: 18    Height: 6' (1.829 m)    Weight: 65.772 kg (145 lb)    SpO2: 98% 99%            Physical Exam   Constitutional: She is oriented to person, place, and time. She appears well-developed and well-nourished. No distress.   HENT:   Head: Normocephalic and atraumatic.   Right Ear: External ear normal.   Left Ear: External ear normal.   Eyes: Conjunctivae and EOM are normal. Pupils are equal, round, and reactive to light.   Neck: Normal range of motion. Neck supple.   Cardiovascular: Normal rate and regular rhythm.     Pulmonary/Chest: Effort normal and breath sounds normal. No respiratory distress. She has no wheezes. She has no rales.   congested cough    Abdominal: Soft. Bowel sounds are normal. There is no tenderness. There is no rebound and no guarding.   Musculoskeletal: She exhibits tenderness. She exhibits no edema.   Pain to very light touch left flank area, no skin changes   Neurological: She is alert and oriented to person, place, and time.   Skin: Skin is warm.   Nursing note and vitals reviewed.       MDM  Number of Diagnoses or Management Options  Diagnosis management comments: Bronchitis, will stressed to stop smoking  H/o renal stones, all previous ct scans w/o ureteral stone, pt appears comfortable, urine wit small blood but also current menses  Stressed follow up        Amount and/or Complexity of Data Reviewed  Clinical lab tests: ordered and reviewed  Review and summarize past medical records: yes    Risk of Complications, Morbidity, and/or Mortality  Presenting problems: low  Diagnostic procedures: low  Management options: low    Patient Progress  Patient progress: improved      Procedures

## 2014-09-14 NOTE — ED Notes (Signed)
I have reviewed discharge instructions with the patient.  Printed instructions, follow-up information and a prescription was given. The patient verbalized understanding. Pt left ambulatory with her family members.

## 2014-09-14 NOTE — ED Notes (Signed)
Patient c/o left flank pain and cough x 1 week.

## 2014-11-12 ENCOUNTER — Emergency Department: Admit: 2014-11-12 | Payer: MEDICAID | Primary: Family Medicine

## 2014-11-12 ENCOUNTER — Inpatient Hospital Stay
Admit: 2014-11-12 | Discharge: 2014-11-12 | Disposition: A | Discharge status: Home / Self Care | Payer: MEDICAID | Attending: Emergency Medicine

## 2014-11-12 DIAGNOSIS — R109 Unspecified abdominal pain: Secondary | ICD-10-CM

## 2014-11-12 LAB — HCG URINE, QL. - POC: Pregnancy test,urine (POC): NEGATIVE

## 2014-11-12 MED ORDER — TRAMADOL 50 MG TAB
50 mg | ORAL_TABLET | Freq: Four times a day (QID) | ORAL | Status: DC | PRN
Start: 2014-11-12 — End: 2014-12-05

## 2014-11-12 MED ORDER — ONDANSETRON 8 MG TAB, RAPID DISSOLVE
8 mg | ORAL | Status: AC
Start: 2014-11-12 — End: 2014-11-12
  Administered 2014-11-12: 19:00:00 via ORAL

## 2014-11-12 MED ORDER — CYCLOBENZAPRINE 10 MG TAB
10 mg | ORAL_TABLET | Freq: Three times a day (TID) | ORAL | Status: DC | PRN
Start: 2014-11-12 — End: 2018-09-16

## 2014-11-12 MED ORDER — PROMETHAZINE 25 MG TAB
25 mg | ORAL_TABLET | Freq: Four times a day (QID) | ORAL | Status: DC | PRN
Start: 2014-11-12 — End: 2018-09-16

## 2014-11-12 MED ORDER — NAPROXEN SODIUM 550 MG TAB
550 mg | ORAL_TABLET | Freq: Two times a day (BID) | ORAL | Status: DC
Start: 2014-11-12 — End: 2018-09-16

## 2014-11-12 MED ORDER — TAMSULOSIN SR 0.4 MG 24 HR CAP
0.4 mg | Freq: Once | ORAL | Status: AC
Start: 2014-11-12 — End: 2014-11-12
  Administered 2014-11-12: 19:00:00 via ORAL

## 2014-11-12 MED ORDER — KETOROLAC TROMETHAMINE 30 MG/ML INJECTION
30 mg/mL (1 mL) | INTRAMUSCULAR | Status: AC
Start: 2014-11-12 — End: 2014-11-12
  Administered 2014-11-12: 19:00:00 via INTRAMUSCULAR

## 2014-11-12 MED FILL — ONDANSETRON 8 MG TAB, RAPID DISSOLVE: 8 mg | ORAL | Qty: 1

## 2014-11-12 MED FILL — TAMSULOSIN SR 0.4 MG 24 HR CAP: 0.4 mg | ORAL | Qty: 1

## 2014-11-12 MED FILL — KETOROLAC TROMETHAMINE 30 MG/ML INJECTION: 30 mg/mL (1 mL) | INTRAMUSCULAR | Qty: 2

## 2014-11-12 NOTE — ED Provider Notes (Signed)
Patient is a 35 y.o. female presenting with hematuria. The history is provided by the patient.   Blood in Urine   This is a new problem. Episode onset: 4 days. The problem has not changed since onset.The pain is moderate. There has been no fever. Associated symptoms include nausea, vomiting, hematuria, flank pain, abdominal pain and back pain. Pertinent negatives include no chills. She has tried nothing for the symptoms. The treatment provided no relief. Her past medical history is significant for kidney stones.        Past Medical History:   Diagnosis Date   ??? Postpartum fever 05/09/2008   ??? Anemia of pregnancy 05/09/2008   ??? General conditions of the kidney and ureter      kidney stones   ??? General conditions of the kidney and ureter      hydronephrosis   ??? GERD (gastroesophageal reflux disease)      mild   ??? Previous cesarean delivery, delivered 05/09/2008   ??? Chronic kidney disease      kidney stones       Past Surgical History:   Procedure Laterality Date   ??? Pr cesarean delivery only  2001, 2006, 2009   ??? Hx other surgical  201, 2006     lithotripsy x2   ??? Hx other surgical  10/11/09     kidney-stent   ??? Hx other surgical       c-section   ??? Hx tonsillectomy     ??? Hx cesarean section       x4   ??? Hx tubal ligation       Nov 17, 2008   ??? Hx lithotripsy       x5         Family History:   Problem Relation Age of Onset   ??? Alcohol abuse Neg Hx    ??? Arthritis-rheumatoid Neg Hx    ??? Asthma Neg Hx    ??? Bleeding Prob Neg Hx    ??? Diabetes Neg Hx    ??? Heart Disease Neg Hx    ??? Migraines Neg Hx    ??? Psychiatric Disorder Neg Hx    ??? Stroke Neg Hx    ??? Elevated Lipids Neg Hx    ??? Headache Neg Hx    ??? Hypertension Neg Hx    ??? Lung Disease Neg Hx    ??? Mental Retardation Neg Hx    ??? Cancer Maternal Aunt        History     Social History   ??? Marital Status: SINGLE     Spouse Name: N/A   ??? Number of Children: N/A   ??? Years of Education: N/A     Occupational History   ??? Not on file.     Social History Main Topics    ??? Smoking status: Never Smoker    ??? Smokeless tobacco: Never Used   ??? Alcohol Use: No   ??? Drug Use: No   ??? Sexual Activity:     Partners: Male     Pharmacist, hospital Protection: Surgical     Other Topics Concern   ??? Not on file     Social History Narrative           ALLERGIES: Meperidine      Review of Systems   Constitutional: Negative for fever and chills.   HENT: Negative for rhinorrhea and sore throat.    Eyes: Negative for discharge and redness.   Respiratory: Negative for  cough.    Gastrointestinal: Positive for nausea, vomiting and abdominal pain. Negative for diarrhea.   Genitourinary: Positive for hematuria and flank pain. Negative for dysuria.   Musculoskeletal: Positive for back pain. Negative for arthralgias.   Skin: Negative for rash.   Neurological: Negative for dizziness and headaches.   All other systems reviewed and are negative.      Filed Vitals:    11/12/14 1309   BP: 91/53   Pulse: 72   Temp: 98.7 ??F (37.1 ??C)   Resp: 18   Height: 6' (1.829 m)   Weight: 68.04 kg (150 lb)   SpO2: 100%            Physical Exam   Constitutional: She is oriented to person, place, and time. She appears well-developed and well-nourished. No distress.   Patient talking on phone comfortably  Reclining in bed   Initially no signs of distress, until later during physical exam she begins to exhibit some paining behavior, and grimacing    Smiling, comfortable and, seemingly grateful at discharge discussion   HENT:   Head: Normocephalic and atraumatic.   Eyes: Conjunctivae are normal. Pupils are equal, round, and reactive to light. Right eye exhibits no discharge. Left eye exhibits no discharge. No scleral icterus.   Neck: Normal range of motion. Neck supple.   Cardiovascular: Normal rate, regular rhythm and normal heart sounds.  Exam reveals no gallop.    No murmur heard.  Pulmonary/Chest: Effort normal and breath sounds normal. No respiratory distress. She has no wheezes. She has no rales.    Abdominal: Soft. Bowel sounds are normal. There is no tenderness. There is no guarding.   Musculoskeletal: Normal range of motion. She exhibits tenderness. She exhibits no edema.        Lumbar back: She exhibits tenderness. She exhibits no bony tenderness.        Back:    Neurological: She is alert and oriented to person, place, and time. She exhibits normal muscle tone.   cni 2-12 grossly   Skin: Skin is warm and dry. She is not diaphoretic.   Psychiatric: She has a normal mood and affect. Her behavior is normal.   Nursing note and vitals reviewed.       MDM    Procedures

## 2014-11-12 NOTE — ED Notes (Signed)
I have reviewed discharge instructions with the patient.  The patient verbalized understanding. Pt ambulatory to car. Pt home with family.

## 2014-11-12 NOTE — ED Notes (Signed)
C/o hematuria and left flank pain x 3 days.

## 2014-12-05 ENCOUNTER — Inpatient Hospital Stay
Admit: 2014-12-05 | Discharge: 2014-12-05 | Disposition: A | Discharge status: Home / Self Care | Payer: MEDICAID | Attending: Emergency Medicine

## 2014-12-05 ENCOUNTER — Emergency Department: Admit: 2014-12-05 | Payer: MEDICAID | Primary: Family Medicine

## 2014-12-05 DIAGNOSIS — R091 Pleurisy: Secondary | ICD-10-CM

## 2014-12-05 LAB — METABOLIC PANEL, COMPREHENSIVE
A-G Ratio: 1.1 — ABNORMAL LOW (ref 1.2–3.5)
ALT (SGPT): 18 U/L (ref 12–65)
AST (SGOT): 13 U/L — ABNORMAL LOW (ref 15–37)
Albumin: 3.7 g/dL (ref 3.5–5.0)
Alk. phosphatase: 51 U/L (ref 50–136)
Anion gap: 9 mmol/L (ref 7–16)
BUN: 13 MG/DL (ref 6–23)
Bilirubin, total: 0.4 MG/DL (ref 0.2–1.1)
CO2: 24 mmol/L (ref 21–32)
Calcium: 9 MG/DL (ref 8.3–10.4)
Chloride: 105 mmol/L (ref 98–107)
Creatinine: 0.99 MG/DL (ref 0.6–1.0)
GFR est AA: >60 mL/min/{1.73_m2} (ref >60)
GFR est non-AA: >60 mL/min/{1.73_m2} (ref >60)
Globulin: 3.3 g/dL (ref 2.3–3.5)
Glucose: 103 mg/dL — ABNORMAL HIGH (ref 65–100)
Potassium: 4 mmol/L (ref 3.5–5.1)
Protein, total: 7 g/dL (ref 6.3–8.2)
Sodium: 138 mmol/L (ref 136–145)

## 2014-12-05 LAB — CBC W/O DIFF
HCT: 34.7 % — ABNORMAL LOW (ref 35.8–46.3)
HGB: 11.2 g/dL — ABNORMAL LOW (ref 11.7–15.4)
MCH: 29.2 PG (ref 26.1–32.9)
MCHC: 32.3 g/dL (ref 31.4–35.0)
MCV: 90.6 FL (ref 79.6–97.8)
MPV: 9.9 FL — ABNORMAL LOW (ref 10.8–14.1)
PLATELET: 249 10*3/uL (ref 150–450)
RBC: 3.83 M/uL — ABNORMAL LOW (ref 4.05–5.25)
RDW: 14.8 % — ABNORMAL HIGH (ref 11.9–14.6)
WBC: 8.1 10*3/uL (ref 4.3–11.1)

## 2014-12-05 LAB — POC TROPONIN: Troponin-I (POC): 0 ng/ml (ref 0.0–0.08)

## 2014-12-05 MED ORDER — SODIUM CHLORIDE 0.9 % IJ SYRG
INTRAMUSCULAR | Status: DC | PRN
Start: 2014-12-05 — End: 2014-12-05

## 2014-12-05 MED ORDER — ALBUTEROL SULFATE 0.083 % (0.83 MG/ML) SOLN FOR INHALATION
2.5 mg /3 mL (0.083 %) | Freq: Once | RESPIRATORY_TRACT | Status: AC
Start: 2014-12-05 — End: 2014-12-05
  Administered 2014-12-05: 06:00:00 via RESPIRATORY_TRACT

## 2014-12-05 MED ORDER — METHYLPREDNISOLONE (PF) 125 MG/2 ML IJ SOLR
125 mg/2 mL | Freq: Once | INTRAMUSCULAR | Status: AC
Start: 2014-12-05 — End: 2014-12-05
  Administered 2014-12-05: 06:00:00 via INTRAVENOUS

## 2014-12-05 MED ORDER — ALBUTEROL SULFATE HFA 90 MCG/ACTUATION AEROSOL INHALER
90 mcg/actuation | RESPIRATORY_TRACT | Status: DC | PRN
Start: 2014-12-05 — End: 2018-09-16

## 2014-12-05 MED ORDER — HYDROCODONE-HOMATROPINE 5 MG-1.5 MG/5 ML (5 ML) ORAL SOLUTION
Freq: Four times a day (QID) | ORAL | Status: DC
Start: 2014-12-05 — End: 2018-09-16

## 2014-12-05 MED ORDER — AZITHROMYCIN 250 MG TAB
250 mg | ORAL_TABLET | ORAL | Status: AC
Start: 2014-12-05 — End: 2014-12-10

## 2014-12-05 MED ORDER — PREDNISONE 10 MG TABLETS IN A DOSE PACK
10 mg | ORAL_TABLET | ORAL | Status: DC
Start: 2014-12-05 — End: 2018-09-16

## 2014-12-05 MED ORDER — SODIUM CHLORIDE 0.9 % IJ SYRG
Freq: Three times a day (TID) | INTRAMUSCULAR | Status: DC
Start: 2014-12-05 — End: 2014-12-05

## 2014-12-05 MED FILL — SOLU-MEDROL (PF) 125 MG/2 ML SOLUTION FOR INJECTION: 125 mg/2 mL | INTRAMUSCULAR | Qty: 2

## 2014-12-05 MED FILL — ALBUTEROL SULFATE 0.083 % (0.83 MG/ML) SOLN FOR INHALATION: 2.5 mg /3 mL (0.083 %) | RESPIRATORY_TRACT | Qty: 1

## 2014-12-05 NOTE — ED Provider Notes (Signed)
HPI Comments: 34 year old female complaining of 5 days of cough and chest pain.  Patient states the pain is mostly when she coughs.  She's had some congestion and some sputum production.  She denies fevers or chills but does have body aches.  He has tried over-the-counter remedies without relief.    Patient is a 34 y.o. female presenting with cough. The history is provided by the patient.   Cough  This is a new problem. The problem has not changed since onset.The cough is productive of sputum. There has been no fever. She has tried cough syrup and decongestants for the symptoms.        Past Medical History:   Diagnosis Date   ??? Postpartum fever 05/09/2008   ??? Anemia of pregnancy 05/09/2008   ??? General conditions of the kidney and ureter      kidney stones   ??? General conditions of the kidney and ureter      hydronephrosis   ??? GERD (gastroesophageal reflux disease)      mild   ??? Previous cesarean delivery, delivered 05/09/2008   ??? Chronic kidney disease      kidney stones       Past Surgical History:   Procedure Laterality Date   ??? Pr cesarean delivery only  2001, 2006, 2009   ??? Hx other surgical  201, 2006     lithotripsy x2   ??? Hx other surgical  10/11/09     kidney-stent   ??? Hx other surgical       c-section   ??? Hx tonsillectomy     ??? Hx cesarean section       x4   ??? Hx tubal ligation       Nov 17, 2008   ??? Hx lithotripsy       x5         Family History:   Problem Relation Age of Onset   ??? Alcohol abuse Neg Hx    ??? Arthritis-rheumatoid Neg Hx    ??? Asthma Neg Hx    ??? Bleeding Prob Neg Hx    ??? Diabetes Neg Hx    ??? Heart Disease Neg Hx    ??? Migraines Neg Hx    ??? Psychiatric Disorder Neg Hx    ??? Stroke Neg Hx    ??? Elevated Lipids Neg Hx    ??? Headache Neg Hx    ??? Hypertension Neg Hx    ??? Lung Disease Neg Hx    ??? Mental Retardation Neg Hx    ??? Cancer Maternal Aunt        History     Social History   ??? Marital Status: SINGLE     Spouse Name: N/A   ??? Number of Children: N/A   ??? Years of Education: N/A      Occupational History   ??? Not on file.     Social History Main Topics   ??? Smoking status: Never Smoker    ??? Smokeless tobacco: Never Used   ??? Alcohol Use: No   ??? Drug Use: No   ??? Sexual Activity:     Partners: Male     Pharmacist, hospitalBirth Control/ Protection: Surgical     Other Topics Concern   ??? Not on file     Social History Narrative           ALLERGIES: Meperidine      Review of Systems   Constitutional: Negative.  Negative for activity change.  HENT: Negative.    Eyes: Negative.    Respiratory: Positive for cough.    Cardiovascular: Negative.    Gastrointestinal: Negative.    Genitourinary: Negative.    Musculoskeletal: Negative.    Skin: Negative.    Neurological: Negative.    Psychiatric/Behavioral: Negative.    All other systems reviewed and are negative.      Filed Vitals:    12/05/14 0108   BP: 104/54   Pulse: 68   Temp: 98.9 ??F (37.2 ??C)   Resp: 20   Height:  (1.676 m)   Weight: 63.504 kg (140 lb)   SpO2: 100%            Physical Exam   Constitutional: She is oriented to person, place, and time. She appears well-developed and well-nourished. No distress.   HENT:   Head: Normocephalic and atraumatic.   Right Ear: External ear normal.   Left Ear: External ear normal.   Nose: Nose normal.   Mouth/Throat: Oropharynx is clear and moist. No oropharyngeal exudate.   Eyes: Conjunctivae and EOM are normal. Pupils are equal, round, and reactive to light. Right eye exhibits no discharge. Left eye exhibits no discharge. No scleral icterus.   Neck: Normal range of motion. Neck supple. No JVD present. No tracheal deviation present.   Cardiovascular: Normal rate, regular rhythm and intact distal pulses.    Pulmonary/Chest: Effort normal and breath sounds normal. No stridor. No respiratory distress. She has no wheezes. She exhibits no tenderness.   Abdominal: Soft. Bowel sounds are normal. She exhibits no distension and no mass. There is no tenderness.   Musculoskeletal: Normal range of motion. She exhibits no edema or  tenderness.   Neurological: She is alert and oriented to person, place, and time. No cranial nerve deficit.   Skin: Skin is warm and dry. No rash noted. She is not diaphoretic. No erythema. No pallor.   Psychiatric: She has a normal mood and affect. Her behavior is normal. Thought content normal.   Nursing note and vitals reviewed.       MDM  Number of Diagnoses or Management Options     Amount and/or Complexity of Data Reviewed  Clinical lab tests: ordered and reviewed  Tests in the radiology section of CPT??: ordered and reviewed  Tests in the medicine section of CPT??: ordered and reviewed    Risk of Complications, Morbidity, and/or Mortality  Presenting problems: high  Diagnostic procedures: high  Management options: high        Procedures

## 2014-12-05 NOTE — ED Notes (Signed)
Discharge instructions covered verbally.  Pt verbalized understanding.

## 2014-12-05 NOTE — ED Notes (Signed)
Fever, chills, cough and dizziness with chest pain x 5 days.  States she has taken two boxes of day quil/nyquil without relief.

## 2014-12-06 LAB — EKG, 12 LEAD, INITIAL
Atrial Rate: 71 {beats}/min
Calculated P Axis: 51 degrees
Calculated R Axis: 92 degrees
Calculated T Axis: 71 degrees
P-R Interval: 156 ms
Q-T Interval: 370 ms
QRS Duration: 82 ms
QTC Calculation (Bezet): 402 ms
Ventricular Rate: 71 {beats}/min

## 2014-12-15 ENCOUNTER — Inpatient Hospital Stay
Admit: 2014-12-15 | Discharge: 2014-12-15 | Disposition: A | Discharge status: Home / Self Care | Payer: MEDICAID | Attending: Emergency Medicine

## 2014-12-15 DIAGNOSIS — R109 Unspecified abdominal pain: Secondary | ICD-10-CM

## 2014-12-15 LAB — METABOLIC PANEL, COMPREHENSIVE
A-G Ratio: 1.2 (ref 1.2–3.5)
ALT (SGPT): 20 U/L (ref 12–65)
AST (SGOT): 10 U/L — ABNORMAL LOW (ref 15–37)
Albumin: 3.4 g/dL — ABNORMAL LOW (ref 3.5–5.0)
Alk. phosphatase: 36 U/L — ABNORMAL LOW (ref 50–136)
Anion gap: 5 mmol/L — ABNORMAL LOW (ref 7–16)
BUN: 9 MG/DL (ref 6–23)
Bilirubin, total: 0.5 MG/DL (ref 0.2–1.1)
CO2: 29 mmol/L (ref 21–32)
Calcium: 8.6 MG/DL (ref 8.3–10.4)
Chloride: 108 mmol/L — ABNORMAL HIGH (ref 98–107)
Creatinine: 0.96 MG/DL (ref 0.6–1.0)
GFR est AA: >60 mL/min/{1.73_m2} (ref >60)
GFR est non-AA: >60 mL/min/{1.73_m2} (ref >60)
Globulin: 2.9 g/dL (ref 2.3–3.5)
Glucose: 85 mg/dL (ref 65–100)
Potassium: 3.9 mmol/L (ref 3.5–5.1)
Protein, total: 6.3 g/dL (ref 6.3–8.2)
Sodium: 142 mmol/L (ref 136–145)

## 2014-12-15 LAB — CBC W/O DIFF
HCT: 35.6 % — ABNORMAL LOW (ref 35.8–46.3)
HGB: 11.3 g/dL — ABNORMAL LOW (ref 11.7–15.4)
MCH: 29 PG (ref 26.1–32.9)
MCHC: 31.7 g/dL (ref 31.4–35.0)
MCV: 91.5 FL (ref 79.6–97.8)
MPV: 9.5 FL — ABNORMAL LOW (ref 10.8–14.1)
PLATELET: 224 10*3/uL (ref 150–450)
RBC: 3.89 M/uL — ABNORMAL LOW (ref 4.05–5.25)
RDW: 14.8 % — ABNORMAL HIGH (ref 11.9–14.6)
WBC: 5.1 10*3/uL (ref 4.3–11.1)

## 2014-12-15 MED ORDER — SODIUM CHLORIDE 0.9 % IV
INTRAVENOUS | Status: DC
Start: 2014-12-15 — End: 2014-12-15
  Administered 2014-12-15: 17:00:00 via INTRAVENOUS

## 2014-12-15 MED ORDER — ONDANSETRON (PF) 4 MG/2 ML INJECTION
4 mg/2 mL | INTRAMUSCULAR | Status: AC
Start: 2014-12-15 — End: 2014-12-15
  Administered 2014-12-15: 17:00:00 via INTRAVENOUS

## 2014-12-15 MED ORDER — KETOROLAC TROMETHAMINE 30 MG/ML INJECTION
30 mg/mL (1 mL) | INTRAMUSCULAR | Status: AC
Start: 2014-12-15 — End: 2014-12-15
  Administered 2014-12-15: 18:00:00 via INTRAVENOUS

## 2014-12-15 MED FILL — ONDANSETRON (PF) 4 MG/2 ML INJECTION: 4 mg/2 mL | INTRAMUSCULAR | Qty: 2

## 2014-12-15 MED FILL — KETOROLAC TROMETHAMINE 30 MG/ML INJECTION: 30 mg/mL (1 mL) | INTRAMUSCULAR | Qty: 1

## 2014-12-15 NOTE — Progress Notes (Addendum)
Visited with patient who states she does not have a PCP because the only time she comes to the hospital is for kidney stones and she has a Insurance underwriterUrologist Dr Elease HashimotoMaloney.  I reminded her that a couple of her ER visits were for cough/bronchitis and stressed the importance of having a PCP.  She is not interested in me making her an appointment so I have provided her with a list of PCP and encouraged her to make an appointment to get established. Patient mentioned something about me doing this because she had medicaid.  I assured her that I do this for all patients who do not have a PCP and not just medicaid patients.

## 2014-12-15 NOTE — ED Notes (Signed)
PMD-Dr [name]. Hematuria x 3 days. Flank pain and nausea since yesterday.

## 2014-12-15 NOTE — ED Notes (Signed)
Pt states that it hurts to bad to get a urine specimen.

## 2014-12-15 NOTE — ED Provider Notes (Addendum)
HPI Comments: 34 y/o f with hsx kidney stones and 7 lithotripsies presents with left flank pain onset 3 days ago.  Says she has had blood in urine and passed something that may have been a stone yesterday.  Nausea with vomiting today.  Some burning at urinary meatus.  No abd pain.  Some chills, fever at home. Admits two periods this past month, hsx tubal ligation    Patient is a 34 y.o. female presenting with hematuria. The history is provided by the patient. No language interpreter was used.   Blood in Urine   This is a new problem. The current episode started more than 2 days ago. The problem occurs every urination. The problem has not changed since onset.The quality of the pain is described as burning. The pain is moderate. Patient reports a subjective fever - was not measured.Associated symptoms include chills, nausea, vomiting, frequency, hematuria, hesitancy and flank pain. Pertinent negatives include no sweats, no discharge, no urgency, no vaginal discharge, no abdominal pain and no back pain. Her past medical history is significant for kidney stones.        Past Medical History:   Diagnosis Date   ??? Postpartum fever 05/09/2008   ??? Anemia of pregnancy 05/09/2008   ??? General conditions of the kidney and ureter      kidney stones   ??? General conditions of the kidney and ureter      hydronephrosis   ??? GERD (gastroesophageal reflux disease)      mild   ??? Previous cesarean delivery, delivered 05/09/2008   ??? Chronic kidney disease      kidney stones       Past Surgical History:   Procedure Laterality Date   ??? Pr cesarean delivery only  2001, 2006, 2009   ??? Hx other surgical  201, 2006     lithotripsy x2   ??? Hx other surgical  10/11/09     kidney-stent   ??? Hx other surgical       c-section   ??? Hx tonsillectomy     ??? Hx cesarean section       x4   ??? Hx tubal ligation       Nov 17, 2008   ??? Hx lithotripsy       x5         Family History:   Problem Relation Age of Onset   ??? Alcohol abuse Neg Hx     ??? Arthritis-rheumatoid Neg Hx    ??? Asthma Neg Hx    ??? Bleeding Prob Neg Hx    ??? Diabetes Neg Hx    ??? Heart Disease Neg Hx    ??? Migraines Neg Hx    ??? Psychiatric Disorder Neg Hx    ??? Stroke Neg Hx    ??? Elevated Lipids Neg Hx    ??? Headache Neg Hx    ??? Hypertension Neg Hx    ??? Lung Disease Neg Hx    ??? Mental Retardation Neg Hx    ??? Cancer Maternal Aunt        History     Social History   ??? Marital Status: SINGLE     Spouse Name: N/A   ??? Number of Children: N/A   ??? Years of Education: N/A     Occupational History   ??? Not on file.     Social History Main Topics   ??? Smoking status: Never Smoker    ??? Smokeless tobacco: Never Used   ???  Alcohol Use: No   ??? Drug Use: No   ??? Sexual Activity:     Partners: Male     Pharmacist, hospital Protection: Surgical     Other Topics Concern   ??? Not on file     Social History Narrative           ALLERGIES: Meperidine      Review of Systems   Constitutional: Positive for fever, chills and fatigue.   HENT: Negative for facial swelling and mouth sores.    Eyes: Negative for pain and discharge.   Respiratory: Negative for cough and shortness of breath.    Cardiovascular: Negative for chest pain and palpitations.   Gastrointestinal: Positive for nausea and vomiting. Negative for abdominal pain and diarrhea.   Endocrine: Negative for cold intolerance and heat intolerance.   Genitourinary: Positive for dysuria, hesitancy, frequency, hematuria and flank pain. Negative for urgency and vaginal discharge.   Musculoskeletal: Negative for myalgias and back pain.   Skin: Negative for rash and wound.   Neurological: Negative for dizziness and facial asymmetry.   Psychiatric/Behavioral: Negative for confusion and decreased concentration.       Filed Vitals:    12/15/14 1148   BP: 123/76   Pulse: 87   Temp: 98.4 ??F (36.9 ??C)   SpO2: 100%            Physical Exam   Constitutional: She is oriented to person, place, and time. She appears well-developed and well-nourished. No distress.   HENT:    Head: Normocephalic and atraumatic.   Right Ear: External ear normal.   Left Ear: External ear normal.   Nose: Nose normal.   Eyes: Conjunctivae and EOM are normal. Pupils are equal, round, and reactive to light.   Neck: Normal range of motion. Neck supple.   Cardiovascular: Normal rate, regular rhythm and normal heart sounds.    Pulmonary/Chest: Effort normal and breath sounds normal. No respiratory distress. She has no wheezes.   Abdominal: Soft. Bowel sounds are normal. She exhibits no distension. There is no tenderness.   Left flank pain worse with palpation    Musculoskeletal: Normal range of motion. She exhibits no edema or tenderness.   Neurological: She is alert and oriented to person, place, and time. No cranial nerve deficit. Coordination normal.   Skin: Skin is warm and dry. No rash noted.   Psychiatric: She has a normal mood and affect. Her behavior is normal. Judgment and thought content normal.   Nursing note and vitals reviewed.       MDM  Number of Diagnoses or Management Options  Diagnosis management comments: 34 y/o pt with multiple ed visits here and at [facility] presents with left flank pain, fever, chills, nausea and vomiting with hematuria, and passed yesterday what she thinks may have been a stone.  Review of [place] in April at [facility] shows bilat non obstructing stones present.  Will wait urine spec prior to admin of narcotics.  1:51 PM  Urine dip neg preg, no nitr, no leuk, NO BLOOD.  i suspect drug seeking behavior.  I told pt urine was normal, and no narcotics in ed today.  She agrees.  Will dc home       Amount and/or Complexity of Data Reviewed  Clinical lab tests: ordered and reviewed  Discuss the patient with other providers: (kaczmerick  )    Risk of Complications, Morbidity, and/or Mortality  Presenting problems: minimal  Diagnostic procedures: low  Management options: minimal  Patient Progress  Patient progress: stable      Procedures

## 2014-12-15 NOTE — ED Notes (Signed)
Discharge instr.  Given and reviewed with pt, questions answered and pt verbalized understanding. Ambulaotry out with out dif.

## 2017-11-21 ENCOUNTER — Emergency Department (HOSPITAL_COMMUNITY): Payer: Medicaid - Out of State

## 2017-11-21 ENCOUNTER — Emergency Department (HOSPITAL_COMMUNITY)
Admission: EM | Admit: 2017-11-21 | Discharge: 2017-11-21 | Disposition: A | Discharge status: Home / Self Care | Payer: Medicaid - Out of State | Attending: Emergency Medicine | Admitting: Emergency Medicine

## 2017-11-21 ENCOUNTER — Encounter (HOSPITAL_COMMUNITY): Payer: Self-pay

## 2017-11-21 ENCOUNTER — Other Ambulatory Visit: Payer: Self-pay

## 2017-11-21 DIAGNOSIS — N200 Calculus of kidney: Secondary | ICD-10-CM | POA: Insufficient documentation

## 2017-11-21 DIAGNOSIS — R109 Unspecified abdominal pain: Secondary | ICD-10-CM | POA: Diagnosis present

## 2017-11-21 HISTORY — DX: Calculus of kidney: N20.0

## 2017-11-21 LAB — URINALYSIS, ROUTINE W REFLEX MICROSCOPIC
Bilirubin Urine: NEGATIVE
Glucose, UA: NEGATIVE mg/dL
Ketones, ur: NEGATIVE mg/dL
LEUKOCYTES UA: NEGATIVE
NITRITE: NEGATIVE
PROTEIN: NEGATIVE mg/dL
SPECIFIC GRAVITY, URINE: 1.012 (ref 1.005–1.030)
pH: 8 (ref 5.0–8.0)

## 2017-11-21 LAB — I-STAT CHEM 8, ED
BUN: 8 mg/dL (ref 6–20)
Calcium, Ion: 1.2 mmol/L (ref 1.15–1.40)
Chloride: 105 mmol/L (ref 101–111)
Creatinine, Ser: 0.7 mg/dL (ref 0.44–1.00)
Glucose, Bld: 77 mg/dL (ref 65–99)
HCT: 35 % — ABNORMAL LOW (ref 36.0–46.0)
Hemoglobin: 11.9 g/dL — ABNORMAL LOW (ref 12.0–15.0)
Potassium: 3.6 mmol/L (ref 3.5–5.1)
Sodium: 140 mmol/L (ref 135–145)
TCO2: 22 mmol/L (ref 22–32)

## 2017-11-21 LAB — POC URINE PREG, ED: PREG TEST UR: NEGATIVE

## 2017-11-21 MED ORDER — PROMETHAZINE HCL 25 MG PO TABS
25.0000 mg | ORAL_TABLET | Freq: Once | ORAL | Status: AC
  Administered 2017-11-21: 25 mg via ORAL
  Filled 2017-11-21: qty 1

## 2017-11-21 MED ORDER — ONDANSETRON 4 MG PO TBDP
4.0000 mg | ORAL_TABLET | Freq: Once | ORAL | Status: AC
  Administered 2017-11-21: 4 mg via ORAL
  Filled 2017-11-21: qty 1

## 2017-11-21 MED ORDER — KETOROLAC TROMETHAMINE 30 MG/ML IJ SOLN
30.0000 mg | Freq: Once | INTRAMUSCULAR | Status: AC
  Administered 2017-11-21: 30 mg via INTRAMUSCULAR
  Filled 2017-11-21: qty 1

## 2017-11-21 MED ORDER — ONDANSETRON HCL 4 MG PO TABS: 4.0000 mg | ORAL_TABLET | Freq: Four times a day (QID) | ORAL | 0 refills | Status: DC

## 2017-11-21 MED ORDER — PROMETHAZINE HCL 25 MG PO TABS: 25.0000 mg | ORAL_TABLET | Freq: Four times a day (QID) | ORAL | 0 refills | Status: DC | PRN

## 2017-11-21 MED ORDER — OXYCODONE-ACETAMINOPHEN 5-325 MG PO TABS: 1.0000 | ORAL_TABLET | Freq: Four times a day (QID) | ORAL | 0 refills | Status: DC | PRN

## 2017-11-21 MED ORDER — OXYCODONE-ACETAMINOPHEN 5-325 MG PO TABS
1.0000 | ORAL_TABLET | ORAL | Status: DC | PRN
  Administered 2017-11-21: 2 via ORAL
  Filled 2017-11-21: qty 2

## 2017-11-21 NOTE — ED Provider Notes (Signed)
[facility] EMERGENCY DEPARTMENT Provider Note   CSN: [id number] Arrival date & time: 11/21/17  1053     History   Chief Complaint Chief Complaint  Patient presents with  . Hematuria  . Flank Pain    HPI Jillian Mcgrath is a 37 y.o. female.  HPI   37 year old female presents today with 2-day history of sharp left-sided flank pain.  Patient notes symptoms most consistent with previous kidney stones as she has had 14 lithotripsies.  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  She notes blood in her urine, severe pain.  She denies any abdominal pain.  Patient received Percocet prior to my evaluation which improved her symptoms.  Past Medical History:  Diagnosis Date  . Kidney stone    x 14 lithrotripsies    There are no active problems to display for this patient.   Past Surgical History:  Procedure Laterality Date  . LITHOTRIPSY    . TONSILLECTOMY       OB History   None      Home Medications    Prior to Admission medications   Medication Sig Start Date End Date Taking? Authorizing Provider  ondansetron (ZOFRAN) 4 MG tablet Take 1 tablet (4 mg total) by mouth every 6 (six) hours. 11/21/17   Cordella Nyquist, Tinnie Gens, PA-C  oxyCODONE-acetaminophen (PERCOCET/ROXICET) 5-325 MG tablet Take 1 tablet by mouth every 6 (six) hours as needed for severe pain. 11/21/17   Amulya Quintin, Tinnie Gens, PA-C  promethazine (PHENERGAN) 25 MG tablet Take 1 tablet (25 mg total) by mouth every 6 (six) hours as needed for nausea or vomiting. 11/21/17   Eyvonne Mechanic, PA-C    Family History History reviewed. No pertinent family history.  Social History Social History   Tobacco Use  . Smoking status: Never Smoker  Substance Use Topics  . Alcohol use: Never    Frequency: Never  . Drug use: Never     Allergies   Demerol [meperidine] and Sulfa antibiotics   Review of Systems Review of Systems  All other systems reviewed and are  negative.  Physical Exam Updated Vital Signs BP (!) 104/50   Pulse (!) 52   Temp 99 F (37.2 C) (Oral)   Resp 18   Ht  (1.753 m)   Wt 69.9 kg (154 lb)   LMP 11/14/2017 (Exact Date)   SpO2 100%   BMI 22.74 kg/m   Physical Exam  Constitutional: She is oriented to person, place, and time. She appears well-developed and well-nourished.  HENT:  Head: Normocephalic and atraumatic.  Eyes: Pupils are equal, round, and reactive to light. Conjunctivae are normal. Right eye exhibits no discharge. Left eye exhibits no discharge. No scleral icterus.  Neck: Normal range of motion. No JVD present. No tracheal deviation present.  Pulmonary/Chest: Effort normal. No stridor.  Abdominal: Soft. She exhibits no distension and no mass. There is no tenderness. There is no rebound and no guarding. No hernia.  Minor left-sided CVA tenderness  Neurological: She is alert and oriented to person, place, and time. Coordination normal.  Psychiatric: She has a normal mood and affect. Her behavior is normal. Judgment and thought content normal.  Nursing note and vitals reviewed.    ED Treatments / Results  Labs (all labs ordered are listed, but only abnormal results are displayed) Labs Reviewed  URINALYSIS, ROUTINE W REFLEX MICROSCOPIC - Abnormal; Notable for the following components:      Result Value  Hgb urine dipstick MODERATE (*)    Bacteria, UA RARE (*)    All other components within normal limits  I-STAT CHEM 8, ED - Abnormal; Notable for the following components:   Hemoglobin 11.9 (*)    HCT 35.0 (*)    All other components within normal limits  POC URINE PREG, ED    EKG None  Radiology Ct Renal Stone Study  Result Date: 11/21/2017 CLINICAL DATA:  37 year old female with acute LEFT flank and abdominal pain with nausea and vomiting for 3 days. History of urinary calculi. EXAM: CT ABDOMEN AND PELVIS WITHOUT CONTRAST TECHNIQUE: Multidetector CT imaging of the abdomen and pelvis was  performed following the standard protocol without IV contrast. COMPARISON:  None. FINDINGS: Please note that parenchymal abnormalities may be missed without intravenous contrast. Lower chest: No acute abnormality. Hepatobiliary: The liver and gallbladder are unremarkable. No biliary dilatation. Pancreas: Unremarkable Spleen: Unremarkable Adrenals/Urinary Tract: Multiple nonobstructing bilateral renal calculi are identified. The largest nonobstructing calculus measures 9 mm within the mid-LOWER RIGHT kidney. There is no evidence of hydronephrosis. There are several calcifications within the LEFT anatomic pelvis. Although these most likely represent phleboliths, it is difficult to totally exclude a nonobstructing distal LEFT ureteral calculus. The bladder and adrenal glands are unremarkable. Stomach/Bowel: Stomach is within normal limits. Appendix appears normal. No evidence of bowel wall thickening, distention, or inflammatory changes. Vascular/Lymphatic: No significant vascular findings are present. No enlarged abdominal or pelvic lymph nodes. Reproductive: Uterus and bilateral adnexa are unremarkable. Other: No abdominal wall hernia or abnormality. No abdominopelvic ascites. Musculoskeletal: No acute or significant osseous findings. IMPRESSION: 1. Indeterminate calcifications within the LEFT anatomic pelvis, most definitely representing phleboliths. However, it is difficult to totally exclude a nonobstructing distal LEFT ureteral calculus on this exam. Comparison CTs would be helpful if those exist. 2. Multiple nonobstructing bilateral renal calculi. No evidence of hydronephrosis. Electronically Signed   By: Harmon Pier M.D.   On: 11/21/2017 16:17    Procedures Procedures (including critical care time)  Medications Ordered in ED Medications  oxyCODONE-acetaminophen (PERCOCET/ROXICET) 5-325 MG per tablet 1-2 tablet (2 tablets Oral Given 11/21/17 1112)  ondansetron (ZOFRAN-ODT) disintegrating tablet 4 mg (4 mg  Oral Given 11/21/17 1112)  ketorolac (TORADOL) 30 MG/ML injection 30 mg (30 mg Intramuscular Given 11/21/17 1454)  ondansetron (ZOFRAN-ODT) disintegrating tablet 4 mg (4 mg Oral Given 11/21/17 1453)  promethazine (PHENERGAN) tablet 25 mg (25 mg Oral Given 11/21/17 1639)     Initial Impression / Assessment and Plan / ED Course  I have reviewed the triage vital signs and the nursing notes.  Pertinent labs & imaging results that were available during my care of the patient were reviewed by me and considered in my medical decision making (see chart for details).     Labs: Point-of-care urine parenchyma urinalysis  Imaging: CT renal  Consults:  Therapeutics: Toradol, Percocet, Zofran, promethazine  Discharge Meds: Promethazine, Zofran, Percocet  Assessment/Plan: 37 year old female presents today with complaints of left-sided flank pain.  Patient reports this feels like kidney stones.  Patient has no signs of obstructive stone, question calcifications within the pelvis.  Patient with normal renal function no signs of infection or urine.  Should be treated for pain, outpatient follow-up given her extensive stone history.  She is given strict return precautions, she verbalized understanding and agreement to today's plan had no further questions or concerns.  Patient was written a prescription for both promethazine and Zofran, she will be feeling 1 of the prescriptions.  Final Clinical Impressions(s) / ED Diagnoses   Final diagnoses:  Nephrolithiasis    ED Discharge Orders        Ordered    oxyCODONE-acetaminophen (PERCOCET/ROXICET) 5-325 MG tablet  Every 6 hours PRN     11/21/17 1644    ondansetron (ZOFRAN) 4 MG tablet  Every 6 hours     11/21/17 1644    promethazine (PHENERGAN) 25 MG tablet  Every 6 hours PRN     11/21/17 1644       Wendell Fiebig, Josie Dixon 11/21/17 1647    Gerhard Munch, MD 11/22/17 2033

## 2017-11-21 NOTE — ED Triage Notes (Signed)
Pt endorses hematuria with left sided flank/abd pain x 3 days. Has hx of 14 previous lithotripsies. States this feels like kidney stone. VSS

## 2017-11-21 NOTE — ED Notes (Signed)
Patient transported to CT 

## 2017-11-21 NOTE — ED Notes (Signed)
Pt returned from CT °

## 2017-11-21 NOTE — Discharge Instructions (Addendum)
Please read attached information. If you experience any new or worsening signs or symptoms please return to the emergency room for evaluation. Please follow-up with your primary care provider or specialist as discussed. Please use medication prescribed only as directed and discontinue taking if you have any concerning signs or symptoms.   °

## 2017-12-17 ENCOUNTER — Encounter (HOSPITAL_COMMUNITY): Payer: Self-pay

## 2017-12-17 ENCOUNTER — Emergency Department (HOSPITAL_COMMUNITY)
Admission: EM | Admit: 2017-12-17 | Discharge: 2017-12-17 | Disposition: A | Discharge status: Home / Self Care | Payer: Medicaid - Out of State | Attending: Emergency Medicine | Admitting: Emergency Medicine

## 2017-12-17 ENCOUNTER — Emergency Department (HOSPITAL_COMMUNITY): Payer: Medicaid - Out of State

## 2017-12-17 ENCOUNTER — Other Ambulatory Visit: Payer: Self-pay

## 2017-12-17 DIAGNOSIS — R109 Unspecified abdominal pain: Secondary | ICD-10-CM | POA: Diagnosis present

## 2017-12-17 DIAGNOSIS — R319 Hematuria, unspecified: Secondary | ICD-10-CM | POA: Diagnosis not present

## 2017-12-17 DIAGNOSIS — Z79899 Other long term (current) drug therapy: Secondary | ICD-10-CM | POA: Insufficient documentation

## 2017-12-17 DIAGNOSIS — N200 Calculus of kidney: Secondary | ICD-10-CM | POA: Diagnosis not present

## 2017-12-17 DIAGNOSIS — N390 Urinary tract infection, site not specified: Secondary | ICD-10-CM | POA: Insufficient documentation

## 2017-12-17 LAB — COMPREHENSIVE METABOLIC PANEL
ALBUMIN: 3.5 g/dL (ref 3.5–5.0)
ALK PHOS: 39 U/L (ref 38–126)
ALT: 13 U/L — ABNORMAL LOW (ref 14–54)
ANION GAP: 8 (ref 5–15)
AST: 16 U/L (ref 15–41)
BUN: 13 mg/dL (ref 6–20)
CALCIUM: 8.9 mg/dL (ref 8.9–10.3)
CO2: 24 mmol/L (ref 22–32)
Chloride: 106 mmol/L (ref 101–111)
Creatinine, Ser: 0.81 mg/dL (ref 0.44–1.00)
GFR calc non Af Amer: >60 mL/min (ref >60)
GLUCOSE: 101 mg/dL — AB (ref 65–99)
POTASSIUM: 3.9 mmol/L (ref 3.5–5.1)
SODIUM: 138 mmol/L (ref 135–145)
Total Bilirubin: 0.3 mg/dL (ref 0.3–1.2)
Total Protein: 6.7 g/dL (ref 6.5–8.1)

## 2017-12-17 LAB — CBC
HEMATOCRIT: 36.6 % (ref 36.0–46.0)
HEMOGLOBIN: 11.5 g/dL — AB (ref 12.0–15.0)
MCH: 28.2 pg (ref 26.0–34.0)
MCHC: 31.4 g/dL (ref 30.0–36.0)
MCV: 89.7 fL (ref 78.0–100.0)
Platelets: 251 10*3/uL (ref 150–400)
RBC: 4.08 MIL/uL (ref 3.87–5.11)
RDW: 15.4 % (ref 11.5–15.5)
WBC: 5 10*3/uL (ref 4.0–10.5)

## 2017-12-17 LAB — URINALYSIS, ROUTINE W REFLEX MICROSCOPIC
BILIRUBIN URINE: NEGATIVE
GLUCOSE, UA: NEGATIVE mg/dL
KETONES UR: NEGATIVE mg/dL
NITRITE: POSITIVE — AB
PH: 6 (ref 5.0–8.0)
Protein, ur: NEGATIVE mg/dL
Specific Gravity, Urine: 1.017 (ref 1.005–1.030)

## 2017-12-17 LAB — LIPASE, BLOOD: Lipase: 39 U/L (ref 11–51)

## 2017-12-17 LAB — I-STAT BETA HCG BLOOD, ED (MC, WL, AP ONLY): I-stat hCG, quantitative: <5 m[IU]/mL (ref <5)

## 2017-12-17 MED ORDER — ONDANSETRON HCL 4 MG/2ML IJ SOLN
4.0000 mg | Freq: Once | INTRAMUSCULAR | Status: AC
  Administered 2017-12-17: 4 mg via INTRAVENOUS
  Filled 2017-12-17: qty 2

## 2017-12-17 MED ORDER — HYDROMORPHONE HCL 1 MG/ML IJ SOLN
1.0000 mg | Freq: Once | INTRAMUSCULAR | Status: AC
  Administered 2017-12-17: 1 mg via INTRAVENOUS
  Filled 2017-12-17: qty 1

## 2017-12-17 MED ORDER — ONDANSETRON 4 MG PO TBDP: 4.0000 mg | ORAL_TABLET | Freq: Three times a day (TID) | ORAL | 0 refills | Status: DC | PRN

## 2017-12-17 MED ORDER — OXYCODONE-ACETAMINOPHEN 5-325 MG PO TABS: 1.0000 | ORAL_TABLET | ORAL | 0 refills | Status: DC | PRN

## 2017-12-17 MED ORDER — OXYCODONE-ACETAMINOPHEN 5-325 MG PO TABS: 2.0000 | ORAL_TABLET | ORAL | 0 refills | Status: DC | PRN

## 2017-12-17 MED ORDER — CEPHALEXIN 500 MG PO CAPS: 500.0000 mg | ORAL_CAPSULE | Freq: Four times a day (QID) | ORAL | 0 refills | Status: DC

## 2017-12-17 MED ORDER — SODIUM CHLORIDE 0.9 % IV SOLN
1.0000 g | Freq: Once | INTRAVENOUS | Status: AC
  Administered 2017-12-17: 1 g via INTRAVENOUS
  Filled 2017-12-17: qty 10

## 2017-12-17 MED ORDER — SODIUM CHLORIDE 0.9 % IV BOLUS
1000.0000 mL | Freq: Once | INTRAVENOUS | Status: AC
  Administered 2017-12-17: 1000 mL via INTRAVENOUS

## 2017-12-17 MED ORDER — AMOXICILLIN-POT CLAVULANATE 875-125 MG PO TABS: 1.0000 | ORAL_TABLET | Freq: Two times a day (BID) | ORAL | 0 refills | Status: DC

## 2017-12-17 NOTE — ED Provider Notes (Signed)
[facility]-EMERGENCY DEPT Provider Note   CSN: [id number] Arrival date & time: 12/17/17  0509     History   Chief Complaint Chief Complaint  Patient presents with  . Abdominal Pain    HPI Jillian Mcgrath is a 37 y.o. female.  The history is provided by the patient. No language interpreter was used.  Abdominal Pain   This is a new problem. The current episode started 6 to 12 hours ago. The problem occurs constantly. The problem has been gradually worsening. The pain is associated with an unknown factor. The quality of the pain is sharp. The pain is at a severity of 9/10. The pain is severe. Associated symptoms include nausea and vomiting. Nothing aggravates the symptoms. Nothing relieves the symptoms. Her past medical history does not include PUD. Past medical history comments: kidney stones.  Pt reports she has had multiple kidney stones in the past.  [place] in [place] through [place].  Pt is scheduled to be seen at [facility] today.  Pt complains of burning with urination.    Past Medical History:  Diagnosis Date  . Kidney stone    x 14 lithrotripsies    There are no active problems to display for this patient.   Past Surgical History:  Procedure Laterality Date  . LITHOTRIPSY    . TONSILLECTOMY       OB History   None      Home Medications    Prior to Admission medications   Medication Sig Start Date End Date Taking? Authorizing Provider  ondansetron (ZOFRAN) 4 MG tablet Take 1 tablet (4 mg total) by mouth every 6 (six) hours. Patient taking differently: Take 4 mg by mouth every 8 (eight) hours as needed for nausea or vomiting.  11/21/17  Yes Hedges, Tinnie GensJeffrey, PA-C  oxyCODONE-acetaminophen (PERCOCET/ROXICET) 5-325 MG tablet Take 1 tablet by mouth every 6 (six) hours as needed for severe pain. 11/21/17  Yes Hedges, Tinnie GensJeffrey, PA-C  promethazine (PHENERGAN) 25 MG tablet Take 1 tablet (25 mg total) by mouth every 6 (six) hours as needed for  nausea or vomiting. 11/21/17  Yes Hedges, Josie DixonJeffrey, PA-C    Family History History reviewed. No pertinent family history.  Social History Social History   Tobacco Use  . Smoking status: Never Smoker  . Smokeless tobacco: Never Used  Substance Use Topics  . Alcohol use: Never    Frequency: Never  . Drug use: Never     Allergies   Demerol [meperidine] and Sulfa antibiotics   Review of Systems Review of Systems  Gastrointestinal: Positive for abdominal pain, nausea and vomiting.  All other systems reviewed and are negative.    Physical Exam Updated Vital Signs BP 114/60   Pulse 65   Temp 98.2 F (36.8 C) (Oral)   Resp 18   Ht 6' (1.829 m)   Wt 69.4 kg (153 lb)   LMP 11/14/2017 (Approximate)   SpO2 99%   BMI 20.75 kg/m   Physical Exam  Constitutional: She is oriented to person, place, and time. She appears well-developed and well-nourished.  HENT:  Head: Normocephalic.  Eyes: EOM are normal.  Neck: Normal range of motion.  Cardiovascular: Normal rate, regular rhythm, normal heart sounds and intact distal pulses.  Pulmonary/Chest: Effort normal.  Abdominal: Soft. Normal appearance and bowel sounds are normal. She exhibits no distension. There is CVA tenderness. There is no guarding.  Musculoskeletal: Normal range of motion.  Neurological: She is alert and oriented to person, place, and time.  Skin: Skin is warm.  Psychiatric: She has a normal mood and affect.  Nursing note and vitals reviewed.    ED Treatments / Results  Labs (all labs ordered are listed, but only abnormal results are displayed) Labs Reviewed  COMPREHENSIVE METABOLIC PANEL - Abnormal; Notable for the following components:      Result Value   Glucose, Bld 101 (*)    ALT 13 (*)    All other components within normal limits  CBC - Abnormal; Notable for the following components:   Hemoglobin 11.5 (*)    All other components within normal limits  URINALYSIS, ROUTINE W REFLEX MICROSCOPIC -  Abnormal; Notable for the following components:   APPearance HAZY (*)    Hgb urine dipstick MODERATE (*)    Nitrite POSITIVE (*)    Leukocytes, UA LARGE (*)    RBC / HPF >50 (*)    WBC, UA >50 (*)    Bacteria, UA RARE (*)    All other components within normal limits  URINE CULTURE  LIPASE, BLOOD  I-STAT BETA HCG BLOOD, ED ([facility], [facility], [facility] ONLY)    EKG None  Radiology Dg Abdomen 1 View  Result Date: 12/17/2017 CLINICAL DATA:  Emesis and flank pain EXAM: ABDOMEN - 1 VIEW COMPARISON:  CT abdomen and pelvis Nov 21, 2017 FINDINGS: There is fairly diffuse stool throughout much of the colon. There is no bowel dilatation or air-fluid level to suggest bowel obstruction. No free air. There are phleboliths in the pelvis. There is a calculus in the mid right kidney measuring 8 x 6 mm. IMPRESSION: Right renal calculus. Stool throughout much of colon. No bowel obstruction or free air evident. Electronically Signed   By: Bretta Bang III M.D.   On: 12/17/2017 07:18    Procedures Procedures (including critical care time)  Medications Ordered in ED Medications  cefTRIAXone (ROCEPHIN) 1 g in sodium chloride 0.9 % 100 mL IVPB (has no administration in time range)  sodium chloride 0.9 % bolus 1,000 mL (1,000 mLs Intravenous New Bag/Given 12/17/17 0907)  HYDROmorphone (DILAUDID) injection 1 mg (has no administration in time range)  ondansetron (ZOFRAN) injection 4 mg (4 mg Intravenous Given 12/17/17 0907)     Initial Impression / Assessment and Plan / ED Course  I have reviewed the triage vital signs and the nursing notes.  Pertinent labs & imaging results that were available during my care of the patient were reviewed by me and considered in my medical decision making (see chart for details).     MDM  Urine shows greater than 50 wbc's and greater than 50 rbc's. Ct scan reviewed from last viist.  Pt has 9mm stone in kidney.  Kub no hydronephrosis.  Pt given Rocephin 1 gram IV.  zofran and IV fluids  due to vomiting. I spoke to Dr. Laverle Patter who requested ultrasound.  Ultrasound obtained and Dr. Laverle Patter advised repeat ct scan.   Final Clinical Impressions(s) / ED Diagnoses   Final diagnoses:  Urinary tract infection with hematuria, site unspecified  Kidney stone on left side    ED Discharge Orders        Ordered    cephALEXin (KEFLEX) 500 MG capsule  4 times daily     12/17/17 1530    oxyCODONE-acetaminophen (PERCOCET/ROXICET) 5-325 MG tablet  Every 4 hours PRN     12/17/17 1530    ondansetron (ZOFRAN ODT) 4 MG disintegrating tablet  Every 8 hours PRN     12/17/17 1530  Osie Cheeks 12/17/17 1531    Azalia Bilis, MD 12/18/17 0800

## 2017-12-17 NOTE — Discharge Instructions (Addendum)
Reschedule to see the Urologist.  You have a urinary tract infection currently.  You do not have any passing stones. Take antibiotic as directed. Pain and nausea medication as directed

## 2017-12-17 NOTE — ED Notes (Signed)
Bed: [id number] Expected date:  Expected time:  Means of arrival:  Comments: 37 yo F/Flank pain

## 2017-12-17 NOTE — ED Triage Notes (Signed)
PER EMS: Pt states she was diagnosed with kidney stones in the beginning of May. Pt reports emesis x4 in the past 4 hours along with bilateral flank pain.  Pt reports hx of kidney stones.

## 2017-12-20 LAB — URINE CULTURE: Culture: >=100000 — AB

## 2017-12-21 ENCOUNTER — Telehealth: Payer: Self-pay

## 2017-12-21 NOTE — Telephone Encounter (Signed)
Post ED Visit - Positive Culture Follow-up  Culture report reviewed by antimicrobial stewardship pharmacist:  []  Enzo BiNathan Batchelder, Pharm.D. []  Celedonio MiyamotoJeremy Frens, Pharm.D., BCPS AQ-ID []  Garvin FilaMike Maccia, Pharm.D., BCPS [x]  Georgina PillionElizabeth Martin, Pharm.D., BCPS []  Derby LineMinh Pham, [address]Pharm.D., BCPS, AAHIVP []  Estella HuskMichelle Turner, Pharm.D., BCPS, AAHIVP []  Lysle Pearlachel Rumbarger, PharmD, BCPS []  Sherlynn CarbonAustin Lucas, PharmD []  Pollyann SamplesAndy Johnston, PharmD, BCPS  Positive urine culture Treated with Cephalexin, organism sensitive to the same and no further patient follow-up is required at this time.  Jerry CarasCullom, Quinn Bartling Burnett 12/21/2017, 9:46 AM

## 2017-12-30 ENCOUNTER — Emergency Department (HOSPITAL_COMMUNITY): Payer: Medicaid - Out of State

## 2017-12-30 ENCOUNTER — Encounter (HOSPITAL_COMMUNITY): Payer: Self-pay | Admitting: Emergency Medicine

## 2017-12-30 ENCOUNTER — Emergency Department (HOSPITAL_COMMUNITY)
Admission: EM | Admit: 2017-12-30 | Discharge: 2017-12-30 | Disposition: A | Discharge status: Home / Self Care | Payer: Medicaid - Out of State | Attending: Emergency Medicine | Admitting: Emergency Medicine

## 2017-12-30 ENCOUNTER — Other Ambulatory Visit: Payer: Self-pay

## 2017-12-30 DIAGNOSIS — J45909 Unspecified asthma, uncomplicated: Secondary | ICD-10-CM | POA: Diagnosis not present

## 2017-12-30 DIAGNOSIS — R109 Unspecified abdominal pain: Secondary | ICD-10-CM | POA: Diagnosis present

## 2017-12-30 DIAGNOSIS — N1330 Unspecified hydronephrosis: Secondary | ICD-10-CM | POA: Insufficient documentation

## 2017-12-30 HISTORY — DX: Unspecified asthma, uncomplicated: J45.909

## 2017-12-30 LAB — CBC WITH DIFFERENTIAL/PLATELET
Basophils Absolute: 0 10*3/uL (ref 0.0–0.1)
Basophils Relative: 0 %
Eosinophils Absolute: 0.1 10*3/uL (ref 0.0–0.7)
Eosinophils Relative: 1 %
HCT: 36.5 % (ref 36.0–46.0)
Hemoglobin: 11.5 g/dL — ABNORMAL LOW (ref 12.0–15.0)
LYMPHS ABS: 2.1 10*3/uL (ref 0.7–4.0)
Lymphocytes Relative: 37 %
MCH: 28.9 pg (ref 26.0–34.0)
MCHC: 31.5 g/dL (ref 30.0–36.0)
MCV: 91.7 fL (ref 78.0–100.0)
Monocytes Absolute: 0.3 10*3/uL (ref 0.1–1.0)
Monocytes Relative: 5 %
Neutro Abs: 3.2 10*3/uL (ref 1.7–7.7)
Neutrophils Relative %: 57 %
PLATELETS: 261 10*3/uL (ref 150–400)
RBC: 3.98 MIL/uL (ref 3.87–5.11)
RDW: 16.5 % — ABNORMAL HIGH (ref 11.5–15.5)
WBC: 5.6 10*3/uL (ref 4.0–10.5)

## 2017-12-30 LAB — URINALYSIS, ROUTINE W REFLEX MICROSCOPIC
Bilirubin Urine: NEGATIVE
GLUCOSE, UA: NEGATIVE mg/dL
Hgb urine dipstick: NEGATIVE
KETONES UR: NEGATIVE mg/dL
LEUKOCYTES UA: NEGATIVE
Nitrite: NEGATIVE
Protein, ur: NEGATIVE mg/dL
Specific Gravity, Urine: 1.017 (ref 1.005–1.030)
pH: 7 (ref 5.0–8.0)

## 2017-12-30 LAB — BASIC METABOLIC PANEL
Anion gap: 7 (ref 5–15)
BUN: 11 mg/dL (ref 6–20)
CO2: 27 mmol/L (ref 22–32)
CREATININE: 0.73 mg/dL (ref 0.44–1.00)
Calcium: 8.9 mg/dL (ref 8.9–10.3)
Chloride: 106 mmol/L (ref 101–111)
GFR calc Af Amer: >60 mL/min (ref >60)
Glucose, Bld: 85 mg/dL (ref 65–99)
POTASSIUM: 3.8 mmol/L (ref 3.5–5.1)
Sodium: 140 mmol/L (ref 135–145)

## 2017-12-30 LAB — I-STAT BETA HCG BLOOD, ED (MC, WL, AP ONLY): I-stat hCG, quantitative: <5 m[IU]/mL (ref <5)

## 2017-12-30 MED ORDER — KETOROLAC TROMETHAMINE 30 MG/ML IJ SOLN
30.0000 mg | Freq: Once | INTRAMUSCULAR | Status: AC
  Administered 2017-12-30: 30 mg via INTRAVENOUS
  Filled 2017-12-30: qty 1

## 2017-12-30 MED ORDER — SODIUM CHLORIDE 0.9 % IV SOLN
1.0000 g | Freq: Once | INTRAVENOUS | Status: AC
  Administered 2017-12-30: 1 g via INTRAVENOUS
  Filled 2017-12-30: qty 10

## 2017-12-30 MED ORDER — ONDANSETRON HCL 4 MG/2ML IJ SOLN
4.0000 mg | Freq: Once | INTRAMUSCULAR | Status: AC
  Administered 2017-12-30: 4 mg via INTRAVENOUS
  Filled 2017-12-30: qty 2

## 2017-12-30 MED ORDER — HYDROMORPHONE HCL 1 MG/ML IJ SOLN
0.5000 mg | Freq: Once | INTRAMUSCULAR | Status: AC
  Administered 2017-12-30: 0.5 mg via INTRAVENOUS
  Filled 2017-12-30: qty 1

## 2017-12-30 MED ORDER — CEPHALEXIN 500 MG PO CAPS: 500.0000 mg | ORAL_CAPSULE | Freq: Three times a day (TID) | ORAL | 0 refills | Status: DC

## 2017-12-30 MED ORDER — ALBUTEROL SULFATE HFA 108 (90 BASE) MCG/ACT IN AERS
2.0000 | INHALATION_SPRAY | Freq: Once | RESPIRATORY_TRACT | Status: AC
  Administered 2017-12-30: 2 via RESPIRATORY_TRACT
  Filled 2017-12-30: qty 6.7

## 2017-12-30 MED ORDER — SODIUM CHLORIDE 0.9 % IV BOLUS
1000.0000 mL | Freq: Once | INTRAVENOUS | Status: AC
  Administered 2017-12-30: 1000 mL via INTRAVENOUS

## 2017-12-30 NOTE — ED Triage Notes (Signed)
Pt reports chest pain with cough, shortness of breath due to asthma. Does not have her inhaler. Noted pain on urination, with threads of blood in urine. Stated that she she felt feverish and has nausea since  yesterday. Currently scheduled to be seen at [facility] on 7/11.

## 2017-12-30 NOTE — Discharge Instructions (Addendum)
Please take your antibiotic as prescribed until all gone.  Call urology office tomorrow or Tuesday for close outpatient follow-up, to see if they can schedule you in sooner.  Return if fever, vomiting, unable to keep medications down, any new concerning symptom.

## 2017-12-30 NOTE — ED Provider Notes (Signed)
[facility]-EMERGENCY DEPT Provider Note   CSN: [id number] Arrival date & time: 12/30/17  1050     History   Chief Complaint Chief Complaint  Patient presents with  . Urinary Tract Infection  . Nephrolithiasis    HPI Jillian Mcgrath is a 37 y.o. female.  HPI Jillian Mcgrath is a 37 y.o. female with history of frequent kidney stones, presents to emergency department complaining of right flank pain, as well as now her chest pain and shortness of breath.  Patient states that she has had right flank pain for several weeks.  She has been seen here initially a month ago and again for the same pain 13 days ago.  States is that CT scan and ultrasound both show that her kidney is swollen.  She states she has a 9 mm kidney stone and unable to get a urology appointment until next month.  She continues to have pain and reports subjective fever and chills as well as nausea and vomiting.  Chart reviewed, patient did have a CT scan on 6/3 619, which showed minimal dilation of the right renal collecting system and proximal ureter with no obstructing stone or mass visualized.  She had stable bilateral nephrolithiasis in the kidney, as well as gallbladder sludge.  She was given pain medications which she is taking as well as antibiotics which she did not take, stating "I do not like antibiotics.  She reports that the pain is severe, associated with nausea and vomiting.  Denies any fever or chills.  She states chest pain shortness of breath started yesterday.  She states feels like prior asthma but does not have an inhaler. Past Medical History:  Diagnosis Date  . Asthma   . Kidney stone    x 14 lithrotripsies    There are no active problems to display for this patient.   Past Surgical History:  Procedure Laterality Date  . LITHOTRIPSY    . TONSILLECTOMY       OB History   None      Home Medications    Prior to Admission medications   Medication Sig Start Date End  Date Taking? Authorizing Provider  ondansetron (ZOFRAN ODT) 4 MG disintegrating tablet Take 1 tablet (4 mg total) by mouth every 8 (eight) hours as needed for nausea or vomiting. 12/17/17  Yes Elson Areas, PA-C  oxyCODONE-acetaminophen (PERCOCET/ROXICET) 5-325 MG tablet Take 2 tablets by mouth every 4 (four) hours as needed for severe pain. 12/17/17  Yes Cheron Schaumann K, PA-C  amoxicillin-clavulanate (AUGMENTIN) 875-125 MG tablet Take 1 tablet by mouth every 12 (twelve) hours. Patient not taking: Reported on 12/30/2017 12/17/17   Elson Areas, PA-C  ondansetron (ZOFRAN ODT) 4 MG disintegrating tablet Take 1 tablet (4 mg total) by mouth every 8 (eight) hours as needed for nausea or vomiting. Patient not taking: Reported on 12/30/2017 12/17/17   Elson Areas, PA-C  oxyCODONE-acetaminophen (PERCOCET/ROXICET) 5-325 MG tablet Take 1 tablet by mouth every 4 (four) hours as needed for severe pain. Patient not taking: Reported on 12/30/2017 12/17/17   Osie Cheeks    Family History History reviewed. No pertinent family history.  Social History Social History   Tobacco Use  . Smoking status: Never Smoker  . Smokeless tobacco: Never Used  Substance Use Topics  . Alcohol use: Never    Frequency: Never  . Drug use: Never     Allergies   Demerol [meperidine] and Sulfa antibiotics   Review of Systems  Review of Systems  Constitutional: Negative for chills and fever.  Respiratory: Positive for chest tightness and shortness of breath. Negative for cough.   Cardiovascular: Negative for chest pain, palpitations and leg swelling.  Gastrointestinal: Positive for abdominal pain, nausea and vomiting. Negative for diarrhea.  Genitourinary: Positive for flank pain and hematuria. Negative for difficulty urinating, dysuria, pelvic pain, vaginal bleeding, vaginal discharge and vaginal pain.  Musculoskeletal: Negative for arthralgias, myalgias, neck pain and neck stiffness.  Skin: Negative for rash.    Neurological: Negative for dizziness, weakness and headaches.  All other systems reviewed and are negative.    Physical Exam Updated Vital Signs BP (!) 111/56 (BP Location: Right Arm)   Pulse 90   Temp 98.4 F (36.9 C) (Oral)   Resp 18   Wt 70.3 kg (155 lb)   LMP 11/29/2017 (Exact Date)   SpO2 100%   BMI 21.02 kg/m   Physical Exam  Constitutional: She appears well-developed and well-nourished. No distress.  HENT:  Head: Normocephalic.  Eyes: Conjunctivae are normal.  Neck: Neck supple.  Cardiovascular: Normal rate, regular rhythm and normal heart sounds.  Pulmonary/Chest: Effort normal and breath sounds normal. No respiratory distress. She has no wheezes. She has no rales.  Abdominal: Soft. Bowel sounds are normal. She exhibits no distension. There is no tenderness. There is no rebound.  No CVA tenderness bilaterally.  Minimal tenderness in the right lower quadrant.  No guarding.  Musculoskeletal: She exhibits no edema.  Neurological: She is alert.  Skin: Skin is warm and dry.  Psychiatric: She has a normal mood and affect. Her behavior is normal.  Nursing note and vitals reviewed.    ED Treatments / Results  Labs (all labs ordered are listed, but only abnormal results are displayed) Labs Reviewed  CBC WITH DIFFERENTIAL/PLATELET - Abnormal; Notable for the following components:      Result Value   Hemoglobin 11.5 (*)    RDW 16.5 (*)    All other components within normal limits  URINE CULTURE  URINALYSIS, ROUTINE W REFLEX MICROSCOPIC  BASIC METABOLIC PANEL  I-STAT BETA HCG BLOOD, ED ([facility], [facility], [facility] ONLY)  I-STAT CHEM 8, ED    EKG EKG Interpretation  Date/Time:  Sunday December 30 2017 12:19:30 EDT Ventricular Rate:  74 PR Interval:    QRS Duration: 94 QT Interval:  373 QTC Calculation: 414 R Axis:   91 Text Interpretation:  Sinus rhythm Borderline right axis deviation Baseline wander in lead(s) V2 No old tracing to compare Confirmed by Melene Plan [phone]) on  12/30/2017 12:21:28 PM Also confirmed by Melene Plan [phone]), editor Barbette Hair [phone])  on 12/30/2017 1:02:52 PM   Radiology US Renal  Result Date: 12/30/2017 CLINICAL DATA:  Right flank pain EXAM: RENAL / URINARY TRACT ULTRASOUND COMPLETE COMPARISON:  CT 12/17/2017 FINDINGS: Right Kidney: Length: 10.9 cm. Mild-to-moderate right hydronephrosis. Multiple renal stones, the largest 8 mm in the lower pole. Normal echotexture. Left Kidney: Length: 10.8 cm. No hydronephrosis. Punctate nonobstructing lower pole stone, 4 mm in the upper pole. Normal echotexture. Bladder: Appears normal for degree of bladder distention. IMPRESSION: Mild to moderate right hydronephrosis. Bilateral nephrolithiasis. Electronically Signed   By: Charlett Nose M.D.   On: 12/30/2017 15:00    Procedures Procedures (including critical care time)  Medications Ordered in ED Medications  HYDROmorphone (DILAUDID) injection 0.5 mg (has no administration in time range)  ondansetron (ZOFRAN) injection 4 mg (has no administration in time range)  ketorolac (TORADOL) 30 MG/ML injection 30 mg (has  no administration in time range)     Initial Impression / Assessment and Plan / ED Course  I have reviewed the triage vital signs and the nursing notes.  Pertinent labs & imaging results that were available during my care of the patient were reviewed by me and considered in my medical decision making (see chart for details).     Patient with flank pain and lower abdominal pain for several weeks.  Was seen here 2 weeks ago, at that time showed minimal dilation in the right renal system, with no obvious stone.  She does have bilateral nephrolithiasis in the kidneys.  She denies any vaginal discharge and states that they did do a pelvic exam last time she was here, however according to the notes I do not see any evidence of pelvic exam.  She is refusing it right now.  She states "they already did all that unnecessary work-up."  I will repeat  renal ultrasound to evaluate for worsening hydronephrosis.  We will repeat her urinalysis and kidney function.  Will give pain medications.  I did review her recent urine analysis, culture grew E. coli.  Patient never took any antibiotics except for Rocephin that she received in emergency department.   3:32 PM' Urine today is negative, however still concerning given she ages had positive culture.  I will send today's urine culture as well.  Patient reports fevers at home, nausea, vomiting, generalized malaise and body aches, I will give her Rocephin here for possible occult infection.  Her ultrasound today showed mild to moderate hydronephrosis.  Labs unremarkable with normal white blood cell count and normal renal function.  I discussed these results with urologist on-call, Dr. Al CorpusNarang.  He will try to set her up a close follow-up appointment in the office, also advised to go ahead and start her an antibiotic and the urine is normal today.  I discussed results with patient.  She take her antibiotics at home.  She will follow-up closely.  She is comfortable going home.  Vitals:   12/30/17 1105 12/30/17 1256 12/30/17 1332 12/30/17 1335  BP:  (!) 111/51 108/63 108/63  Pulse:    71  Resp:  16 12 12   Temp:      TempSrc:      SpO2:    100%  Weight: 70.3 kg (155 lb)        Final Clinical Impressions(s) / ED Diagnoses   Final diagnoses:  Hydronephrosis, unspecified hydronephrosis type    ED Discharge Orders        Ordered    cephALEXin (KEFLEX) 500 MG capsule  3 times daily     12/30/17 1535       Trellis MomentKirichenko, Nicholsonatyana, PA-C 12/30/17 1537    Melene PlanFloyd, Dan, DO 01/01/18 1004

## 2017-12-30 NOTE — ED Notes (Signed)
Patient verbalized understanding of discharge instructions and prescriptions, no questions. IV removed, bleeding controlled. Patient ambulated out of ED with steady gait in no distress. 

## 2017-12-30 NOTE — ED Notes (Signed)
Pt states she is unable to void at this time.

## 2017-12-31 LAB — URINE CULTURE: CULTURE: NO GROWTH

## 2018-01-16 ENCOUNTER — Other Ambulatory Visit: Payer: Self-pay | Admitting: Urology

## 2018-01-16 ENCOUNTER — Encounter (HOSPITAL_COMMUNITY): Payer: Self-pay | Admitting: *Deleted

## 2018-01-16 ENCOUNTER — Other Ambulatory Visit: Payer: Self-pay

## 2018-01-18 ENCOUNTER — Other Ambulatory Visit: Payer: Self-pay

## 2018-01-18 ENCOUNTER — Encounter (HOSPITAL_COMMUNITY): Payer: Self-pay | Admitting: *Deleted

## 2018-01-21 ENCOUNTER — Ambulatory Visit (HOSPITAL_COMMUNITY): Payer: Managed Care, Other (non HMO)

## 2018-01-21 ENCOUNTER — Ambulatory Visit (HOSPITAL_COMMUNITY)
Admission: RE | Admit: 2018-01-21 | Discharge: 2018-01-21 | Disposition: A | Discharge status: Home / Self Care | Payer: Managed Care, Other (non HMO) | Source: Ambulatory Visit | Attending: Urology | Admitting: Urology

## 2018-01-21 ENCOUNTER — Ambulatory Visit (HOSPITAL_COMMUNITY): Payer: Managed Care, Other (non HMO) | Admitting: Certified Registered"

## 2018-01-21 ENCOUNTER — Encounter (HOSPITAL_COMMUNITY): Payer: Self-pay

## 2018-01-21 ENCOUNTER — Encounter (HOSPITAL_COMMUNITY)
Admission: RE | Disposition: A | Discharge status: Home / Self Care | Payer: Self-pay | Source: Ambulatory Visit | Attending: Urology

## 2018-01-21 DIAGNOSIS — N202 Calculus of kidney with calculus of ureter: Secondary | ICD-10-CM | POA: Insufficient documentation

## 2018-01-21 DIAGNOSIS — N29 Other disorders of kidney and ureter in diseases classified elsewhere: Secondary | ICD-10-CM | POA: Diagnosis not present

## 2018-01-21 DIAGNOSIS — R109 Unspecified abdominal pain: Secondary | ICD-10-CM | POA: Diagnosis present

## 2018-01-21 HISTORY — DX: Other specified postprocedural states: R11.2

## 2018-01-21 HISTORY — DX: Other specified postprocedural states: Z98.890

## 2018-01-21 HISTORY — PX: CYSTOSCOPY/URETEROSCOPY/HOLMIUM LASER/STENT PLACEMENT: SHX6546

## 2018-01-21 HISTORY — DX: Personal history of urinary calculi: Z87.442

## 2018-01-21 LAB — CBC WITH DIFFERENTIAL/PLATELET
BASOS ABS: 0 10*3/uL (ref 0.0–0.1)
BASOS PCT: 0 %
Eosinophils Absolute: 0.1 10*3/uL (ref 0.0–0.7)
Eosinophils Relative: 1 %
HCT: 37.7 % (ref 36.0–46.0)
Hemoglobin: 11.9 g/dL — ABNORMAL LOW (ref 12.0–15.0)
Lymphocytes Relative: 36 %
Lymphs Abs: 1.7 10*3/uL (ref 0.7–4.0)
MCH: 28.3 pg (ref 26.0–34.0)
MCHC: 31.6 g/dL (ref 30.0–36.0)
MCV: 89.8 fL (ref 78.0–100.0)
Monocytes Absolute: 0.2 10*3/uL (ref 0.1–1.0)
Monocytes Relative: 4 %
NEUTROS ABS: 2.9 10*3/uL (ref 1.7–7.7)
NEUTROS PCT: 59 %
Platelets: 281 10*3/uL (ref 150–400)
RBC: 4.2 MIL/uL (ref 3.87–5.11)
RDW: 16.5 % — AB (ref 11.5–15.5)
WBC: 4.9 10*3/uL (ref 4.0–10.5)

## 2018-01-21 LAB — BASIC METABOLIC PANEL
ANION GAP: 6 (ref 5–15)
BUN: 16 mg/dL (ref 6–20)
CALCIUM: 9 mg/dL (ref 8.9–10.3)
CO2: 26 mmol/L (ref 22–32)
Chloride: 110 mmol/L (ref 98–111)
Creatinine, Ser: 0.76 mg/dL (ref 0.44–1.00)
Glucose, Bld: 72 mg/dL (ref 70–99)
Potassium: 3.9 mmol/L (ref 3.5–5.1)
Sodium: 142 mmol/L (ref 135–145)

## 2018-01-21 LAB — HCG, SERUM, QUALITATIVE: PREG SERUM: NEGATIVE

## 2018-01-21 SURGERY — CYSTOSCOPY/URETEROSCOPY/HOLMIUM LASER/STENT PLACEMENT
Anesthesia: General | Laterality: Left

## 2018-01-21 MED ORDER — LACTATED RINGERS IV SOLN
INTRAVENOUS | Status: DC
  Administered 2018-01-21: 14:00:00 via INTRAVENOUS

## 2018-01-21 MED ORDER — ONDANSETRON HCL 4 MG/2ML IJ SOLN: 4.0000 mg | Freq: Once | INTRAMUSCULAR | Status: DC | PRN

## 2018-01-21 MED ORDER — FENTANYL CITRATE (PF) 100 MCG/2ML IJ SOLN
INTRAMUSCULAR | Status: DC | PRN
  Administered 2018-01-21 (×2): 25 ug via INTRAVENOUS
  Administered 2018-01-21: 50 ug via INTRAVENOUS
  Administered 2018-01-21: 25 ug via INTRAVENOUS

## 2018-01-21 MED ORDER — MIDAZOLAM HCL 2 MG/2ML IJ SOLN
INTRAMUSCULAR | Status: DC | PRN
  Administered 2018-01-21: 2 mg via INTRAVENOUS

## 2018-01-21 MED ORDER — TAMSULOSIN HCL 0.4 MG PO CAPS: 0.4000 mg | ORAL_CAPSULE | Freq: Every day | ORAL | 3 refills | Status: DC

## 2018-01-21 MED ORDER — OXYBUTYNIN CHLORIDE 5 MG PO TABS: 5.0000 mg | ORAL_TABLET | Freq: Three times a day (TID) | ORAL | Status: DC | PRN

## 2018-01-21 MED ORDER — LIDOCAINE 2% (20 MG/ML) 5 ML SYRINGE
INTRAMUSCULAR | Status: AC
  Filled 2018-01-21: qty 5

## 2018-01-21 MED ORDER — DEXAMETHASONE SODIUM PHOSPHATE 10 MG/ML IJ SOLN
INTRAMUSCULAR | Status: AC
  Filled 2018-01-21: qty 1

## 2018-01-21 MED ORDER — IOHEXOL 300 MG/ML  SOLN
INTRAMUSCULAR | Status: DC | PRN
  Administered 2018-01-21: 10 mL via URETHRAL

## 2018-01-21 MED ORDER — PROPOFOL 10 MG/ML IV BOLUS
INTRAVENOUS | Status: DC | PRN
  Administered 2018-01-21: 150 mg via INTRAVENOUS

## 2018-01-21 MED ORDER — ONDANSETRON HCL 4 MG/2ML IJ SOLN
INTRAMUSCULAR | Status: AC
  Filled 2018-01-21: qty 2

## 2018-01-21 MED ORDER — FENTANYL CITRATE (PF) 100 MCG/2ML IJ SOLN
INTRAMUSCULAR | Status: AC
  Filled 2018-01-21: qty 2

## 2018-01-21 MED ORDER — OXYBUTYNIN CHLORIDE 5 MG PO TABS: 5.0000 mg | ORAL_TABLET | Freq: Three times a day (TID) | ORAL | 3 refills | Status: DC | PRN

## 2018-01-21 MED ORDER — LIDOCAINE 2% (20 MG/ML) 5 ML SYRINGE
INTRAMUSCULAR | Status: DC | PRN
  Administered 2018-01-21: 80 mg via INTRAVENOUS

## 2018-01-21 MED ORDER — SODIUM CHLORIDE 0.9 % IR SOLN
Status: DC | PRN
  Administered 2018-01-21: 3000 mL via INTRAVESICAL

## 2018-01-21 MED ORDER — DEXAMETHASONE SODIUM PHOSPHATE 4 MG/ML IJ SOLN
INTRAMUSCULAR | Status: DC | PRN
  Administered 2018-01-21: 10 mg via INTRAVENOUS

## 2018-01-21 MED ORDER — 0.9 % SODIUM CHLORIDE (POUR BTL) OPTIME
TOPICAL | Status: DC | PRN
  Administered 2018-01-21: 1000 mL

## 2018-01-21 MED ORDER — PROPOFOL 10 MG/ML IV BOLUS
INTRAVENOUS | Status: AC
  Filled 2018-01-21: qty 20

## 2018-01-21 MED ORDER — KETOROLAC TROMETHAMINE 30 MG/ML IJ SOLN
INTRAMUSCULAR | Status: DC | PRN
  Administered 2018-01-21: 30 mg via INTRAVENOUS

## 2018-01-21 MED ORDER — MORPHINE SULFATE (PF) 2 MG/ML IV SOLN: 2.0000 mg | Freq: Once | INTRAVENOUS | Status: DC

## 2018-01-21 MED ORDER — MIDAZOLAM HCL 2 MG/2ML IJ SOLN
INTRAMUSCULAR | Status: AC
  Filled 2018-01-21: qty 2

## 2018-01-21 MED ORDER — CEFAZOLIN SODIUM-DEXTROSE 2-4 GM/100ML-% IV SOLN
2.0000 g | INTRAVENOUS | Status: AC
  Administered 2018-01-21: 2 g via INTRAVENOUS
  Filled 2018-01-21: qty 100

## 2018-01-21 MED ORDER — KETOROLAC TROMETHAMINE 30 MG/ML IJ SOLN
INTRAMUSCULAR | Status: AC
  Filled 2018-01-21: qty 1

## 2018-01-21 MED ORDER — ONDANSETRON HCL 4 MG/2ML IJ SOLN
INTRAMUSCULAR | Status: DC | PRN
  Administered 2018-01-21: 4 mg via INTRAVENOUS

## 2018-01-21 MED ORDER — TAMSULOSIN HCL 0.4 MG PO CAPS: 0.4000 mg | ORAL_CAPSULE | Freq: Every day | ORAL | Status: DC

## 2018-01-21 MED ORDER — HYDROMORPHONE HCL 1 MG/ML IJ SOLN: 0.2500 mg | INTRAMUSCULAR | Status: DC | PRN

## 2018-01-21 MED ORDER — OXYCODONE HCL 5 MG PO TABS: 5.0000 mg | ORAL_TABLET | Freq: Three times a day (TID) | ORAL | 0 refills | Status: DC | PRN

## 2018-01-21 SURGICAL SUPPLY — 23 items
BAG URO CATCHER STRL LF (MISCELLANEOUS) ×2 IMPLANT
BASKET LASER NITINOL 1.9FR (BASKET) IMPLANT
BASKET ZERO TIP NITINOL 2.4FR (BASKET) IMPLANT
CATH INTERMIT  6FR 70CM (CATHETERS) ×2 IMPLANT
CATH URET 5FR 28IN CONE TIP (BALLOONS)
CATH URET 5FR 70CM CONE TIP (BALLOONS) IMPLANT
CLOTH BEACON ORANGE TIMEOUT ST (SAFETY) IMPLANT
COVER FOOTSWITCH UNIV (MISCELLANEOUS) ×2 IMPLANT
EXTRACTOR STONE 1.7FRX115CM (UROLOGICAL SUPPLIES) IMPLANT
FIBER LASER FLEXIVA 365 (UROLOGICAL SUPPLIES) IMPLANT
FIBER LASER TRAC TIP (UROLOGICAL SUPPLIES) IMPLANT
GLOVE BIO SURGEON STRL SZ7.5 (GLOVE) ×2 IMPLANT
GOWN STRL REUS W/TWL XL LVL3 (GOWN DISPOSABLE) ×2 IMPLANT
GUIDEWIRE ANG ZIPWIRE 038X150 (WIRE) IMPLANT
GUIDEWIRE STR DUAL SENSOR (WIRE) ×4 IMPLANT
INFUSOR MANOMETER BAG 3000ML (MISCELLANEOUS) IMPLANT
MANIFOLD NEPTUNE II (INSTRUMENTS) ×2 IMPLANT
PACK CYSTO (CUSTOM PROCEDURE TRAY) ×2 IMPLANT
SHEATH URETERAL 12FRX28CM (UROLOGICAL SUPPLIES) IMPLANT
SHEATH URETERAL 12FRX35CM (MISCELLANEOUS) ×2 IMPLANT
STENT CONTOUR 6FRX26X.038 (STENTS) ×2 IMPLANT
TUBING CONNECTING 10 (TUBING) ×2 IMPLANT
TUBING UROLOGY SET (TUBING) ×2 IMPLANT

## 2018-01-21 NOTE — Op Note (Signed)
Operative Note  Preoperative diagnosis:  1.  Right renal calculus  Postoperative diagnosis: 1.  Renal calculus  Procedure(s): 1.  Cystoscopy with right retrograde pyelogram, right ureteroscopy with laser lithotripsy, right ureteral stent placement  Surgeon: Modena SlaterEugene Bell, MD  Assistants: None  Anesthesia: General  Complications: None immediate  EBL: Minimal  Specimens: 1.  None  Drains/Catheters: 1.  6 x 26 double-J ureteral stent  Intraoperative findings: 1.  Normal urethra and bladder 2.  Right retrograde pyelogram revealed a well opacified ureter and kidney without any hydronephrosis.  There were no obvious filling defects to suggest tumor.  There was some air bubbles however. 3.  Right ureteroscopy revealed evidence of nephrocalcinosis with multiple Randall's plaques.  There was one larger 7 mm ureteral calculus.  This was fragmented to less than 1 mm fragments  Indication: 37 year old female with intermittent right-sided flank pain and a history of renal calculi requiring multiple interventions in the past.  She had a nonobstructing right renal calculus and desired intervention for the stone.  She understands this may not relieve the pain she is experiencing.  Description of procedure:  The patient was identified and consent was obtained.  The patient was taken to the operating room and placed in the supine position.  The patient was placed under general anesthesia.  Perioperative antibiotics were administered.  The patient was placed in dorsal lithotomy.  Patient was prepped and draped in a standard sterile fashion and a timeout was performed.  A 21 French rigid cystoscope was advanced into the urethra and into the bladder.  Complete cystoscopy was performed with no abnormal findings.  An open-ended ureteral catheter was advanced into the distal portion of the right ureter and a retrograde pyelogram was performed with the findings noted above.  A wire was advanced up to the  kidney under fluoroscopic guidance.  A second sensor wire was advanced alongside this up to the kidney under fluoroscopic guidance.  The scope was withdrawn.  Over 1 of the wires, the inner portion of a 12 x 14 access sheath was advanced over the wire under continuous fluoroscopic guidance for passive dilation.  I then advanced the entire 12 x 14 access sheath over the wire again under continuous fluoroscopic guidance up to the renal pelvis followed by removal of the inner portion of the sheath and the wire.  Flexible ureteroscopy was performed which identified the stone of interest which was fragmented to less than 1 mm fragments.  There were no other clinically significant stone fragments.  There were many Randall's plaques.  After all clinically significant stones were fragmented, I withdrew the scope along with the access sheath.  I backloaded the wire onto a rigid cystoscope and advanced it into the bladder.  I then placed a 6 x 26 double-J ureteral stent in a standard fashion followed by removal of the wire.  Fluoroscopy confirmed proximal placement and direct visualization confirmed a good coil within the bladder.  I drained the bladder and withdrew the scope and this concluded the operation.  The patient tolerated the procedure well and was stable postoperatively.  Plan: Follow-up in 1 week for ureteral stent removal.

## 2018-01-21 NOTE — Progress Notes (Signed)
PACU NURSING NOTE: pt fully awake, alert and oriented x 4, pt states "I really have to use the bathroom" pt attempting to get off stretcher and remove monitoring devices, explained to pt will transport her to the restroom, but must remove monitoring cables etc. Pt tx to restroom, ambulated to restroom and voided into measuring device with female RN supervision. Pt immediately then tx to Phase II for DC to home

## 2018-01-21 NOTE — Anesthesia Procedure Notes (Signed)
Procedure Name: LMA Insertion Date/Time: 01/21/2018 3:07 PM Performed by: Vanessa Durhamochran, Valla Pacey Glenn, CRNA Pre-anesthesia Checklist: Emergency Drugs available, Patient identified, Suction available and Patient being monitored Patient Re-evaluated:Patient Re-evaluated prior to induction Oxygen Delivery Method: Circle system utilized Preoxygenation: Pre-oxygenation with 100% oxygen Induction Type: IV induction Ventilation: Mask ventilation without difficulty LMA: LMA inserted LMA Size: 4.0 Number of attempts: 1 Placement Confirmation: positive ETCO2 and breath sounds checked- equal and bilateral Tube secured with: Tape Dental Injury: Teeth and Oropharynx as per pre-operative assessment

## 2018-01-21 NOTE — Discharge Instructions (Signed)

## 2018-01-21 NOTE — Transfer of Care (Signed)
Immediate Anesthesia Transfer of Care Note  Patient: Jillian Mcgrath  Procedure(s) Performed: CYSTOSCOPY /LEFT URETEROSCOPY/HOLMIUM LASER/LEFT STENT PLACEMENT (Left )  Patient Location: PACU  Anesthesia Type:General  Level of Consciousness: drowsy  Airway & Oxygen Therapy: Patient Spontanous Breathing and Patient connected to face mask  Post-op Assessment: Report given to RN and Post -op Vital signs reviewed and stable  Post vital signs: Reviewed and stable  Last Vitals:  Vitals Value Taken Time  BP    Temp    Pulse    Resp    SpO2      Last Pain:  Vitals:   01/21/18 1218  TempSrc: Oral         Complications: No apparent anesthesia complications

## 2018-01-21 NOTE — H&P (Signed)
CC/HPI: Cc: Right flank pain  HPI: A 37 year old female with a history of nephrocalcinosis. She has had greater than 10 lithotripsies in the past. She has had both ESWL and ureteroscopy. She has had this on both kidneys. She has had a stent in the past. For the past month or so, she has been experiencing intermittent right-sided flank pain. She went to the emergency department on 12/17/2017 and a CT scan was performed that revealed mild fullness in the right kidney but no obvious hydronephrosis. There were 2 larger renal calculi on the right up to 7 mm in size. These were nonobstructing. Otherwise, there was evidence of nephrocalcinosis bilaterally. There were small tiny renal calculi nonobstructing bilaterally. No obvious ureteral calculi. She went to the emergency department again on 12/30/2017. Renal ultrasound was performed which revealed hydronephrosis in the right kidney. She continues to have some right-sided flank pain. No nausea, vomiting, fever. Minimal voiding complaints.     ALLERGIES: None   MEDICATIONS: None   GU PSH: None   NON-GU PSH: None   GU PMH: Renal calculus    NON-GU PMH: None   FAMILY HISTORY: copd - Father Kidney Stones - Grandmother   SOCIAL HISTORY: None   REVIEW OF SYSTEMS:    GU Review Female:   Patient reports frequent urination, burning /pain with urination, get up at night to urinate, stream starts and stops, trouble starting your stream, and have to strain to urinate. Patient denies hard to postpone urination, leakage of urine, and being pregnant.  Gastrointestinal (Upper):   Patient reports nausea and vomiting. Patient denies indigestion/ heartburn.  Gastrointestinal (Lower):   Patient denies diarrhea and constipation.  Constitutional:   Patient reports fever and fatigue. Patient denies night sweats and weight loss.  Skin:   Patient denies skin rash/ lesion and itching.  Eyes:   Patient denies blurred vision and double vision.  Ears/ Nose/ Throat:   Patient  denies sore throat and sinus problems.  Hematologic/Lymphatic:   Patient denies swollen glands and easy bruising.  Cardiovascular:   Patient denies leg swelling and chest pains.  Respiratory:   Patient denies cough and shortness of breath.  Endocrine:   Patient denies excessive thirst.  Musculoskeletal:   Patient denies back pain and joint pain.  Neurological:   Patient reports dizziness. Patient denies headaches.  Psychologic:   Patient denies depression and anxiety.   Notes: blood in urine UTI    VITAL SIGNS:      01/15/2018 03:33 PM  Weight 155 lb / 70.31 kg  Height 72 in / 182.88 cm  BP 110/72 mmHg  Heart Rate 78 /min  Temperature 98.4 F / 36.8 C  BMI 21.0 kg/m   MULTI-SYSTEM PHYSICAL EXAMINATION:    Constitutional: Well-nourished. No physical deformities. Normally developed. Good grooming.  Respiratory: No labored breathing, no use of accessory muscles.   Cardiovascular: Normal temperature, adequate perfusion of extremities  Skin: No paleness, no jaundice  Neurologic / Psychiatric: Oriented to time, oriented to place, oriented to person. No depression, no anxiety, no agitation.  Gastrointestinal: No mass, no tenderness, no rigidity, non obese abdomen. No CVA tenderness bilaterally  Eyes: Normal conjunctivae. Normal eyelids.  Musculoskeletal: Normal gait and station of head and neck.     PAST DATA REVIEWED:  Source Of History:  Patient  X-Ray Review: Renal Ultrasound: Reviewed Films. Reviewed Report. Discussed With Patient.  C.T. Abdomen/Pelvis: Reviewed Films. Reviewed Report. Discussed With Patient.     PROCEDURES:  Urinalysis w/Scope Dipstick Dipstick Cont'd Micro  Color: Yellow Bilirubin: Neg WBC/hpf: NS (Not Seen)  Appearance: Cloudy Ketones: Neg RBC/hpf: 3 - 10/hpf  Specific Gravity: 1.015 Blood: 3+ Bacteria: Few (10-25/hpf)  pH: 6.5 Protein: Trace Cystals: NS (Not Seen)  Glucose: Neg Urobilinogen: 0.2 Casts: NS (Not Seen)    Nitrites: Neg  Trichomonas: Not Present    Leukocyte Esterase: Neg Mucous: Not Present      Epithelial Cells: 0 - 5/hpf      Yeast: NS (Not Seen)      Sperm: Not Present         Ketoralac 60mg  - [id number], [id number] Qty: 60 Adm. By: Tawnya Crook  Unit: mg Lot No [id number]  Route: IM Exp. Date 07/18/2019  Freq: None Mfgr.:   Site: Left Hip   ASSESSMENT:      ICD-10 Details  1 GU:   Renal calculus - N20.0   2   Hydronephrosis - N13.0   3   Flank Pain - R10.84    PLAN:           Orders Labs Urine Culture          Document Letter(s):  Created for Patient: Clinical Summary         Notes:   We discussed the management of urinary stones. These options include observation, ureteroscopy, and shockwave lithotripsy. We discussed which options are relevant to these particular stones. We discussed the natural history of stones as well as the complications of untreated stones and the impact on quality of life without treatment as well as with each of the above listed treatments. We also discussed the efficacy of each treatment in its ability to clear the stone burden. With any of these management options I discussed the signs and symptoms of infection and the need for emergent treatment should these be experienced. For each option we discussed the ability of each procedure to clear the patient of their stone burden.   For observation I described the risks which include but are not limited to silent renal damage, life-threatening infection, need for emergent surgery, failure to pass stone, and pain.   For ureteroscopy I described the risks which include heart attack, stroke, pulmonary embolus, death, bleeding, infection, damage to contiguous structures, positioning injury, ureteral stricture, ureteral avulsion, ureteral injury, need for ureteral stent, inability to perform ureteroscopy, need for an interval procedure, inability to clear stone burden, stent discomfort and pain.   For shockwave lithotripsy I described the  risks which include arrhythmia, kidney contusion, kidney hemorrhage, need for transfusion, pain, inability to break up stone, inability to pass stone fragments, Steinstrasse, infection associated with obstructing stones, need for different surgical procedure, need for repeat shockwave lithotripsy, and death.   She would like to proceed with ureteroscopy. I'll plan to try to clear all the stone in the right kidney. She has tiny stones on the left and do not recommend intervention for this currently. This will also allow [place] to inspect the entire ureter to rule out any ureteral stricture given that the scan on 12/17/2017 did not reveal any ureteral calculi.   Toradol today for flank pain.   She does not plan to be in [place] for long. She has had 2 24-hour urinalyses. Therefore we will not repeat this at this time.    Signed by Modena Slater, III, M.D. on 01/15/18 at 4:56 PM (EDT)

## 2018-01-21 NOTE — Anesthesia Postprocedure Evaluation (Signed)
Anesthesia Post Note  Patient: [name]  Procedure(s) Performed: CYSTOSCOPY /LEFT URETEROSCOPY/HOLMIUM LASER/LEFT STENT PLACEMENT (Left )     Patient location during evaluation: PACU Anesthesia Type: General Level of consciousness: awake and alert Pain management: pain level controlled Vital Signs Assessment: post-procedure vital signs reviewed and stable Respiratory status: spontaneous breathing, nonlabored ventilation, respiratory function stable and patient connected to nasal cannula oxygen Cardiovascular status: blood pressure returned to baseline and stable Postop Assessment: no apparent nausea or vomiting Anesthetic complications: no    Last Vitals:  Vitals:   01/21/18 1600 01/21/18 1614  BP: 109/60 101/84  Pulse: (!) 50 (!) 54  Resp: 12 16  Temp:  36.8 C  SpO2: 100% 100%    Last Pain:  Vitals:   01/21/18 1600  TempSrc:   PainSc: 0-No pain                 Zanden Colver DAVID

## 2018-01-21 NOTE — Anesthesia Preprocedure Evaluation (Signed)
Anesthesia Evaluation  Patient identified by MRN, date of birth, ID band Patient awake    Reviewed: Allergy & Precautions, NPO status , Patient's Chart, lab work & pertinent test results  History of Anesthesia Complications (+) PONV  Airway Mallampati: I  TM Distance: >3 FB Neck ROM: Full    Dental   Pulmonary    Pulmonary exam normal        Cardiovascular Normal cardiovascular exam     Neuro/Psych    GI/Hepatic   Endo/Other    Renal/GU      Musculoskeletal   Abdominal   Peds  Hematology   Anesthesia Other Findings   Reproductive/Obstetrics                             Anesthesia Physical Anesthesia Plan  ASA: II  Anesthesia Plan: General   Post-op Pain Management:    Induction: Intravenous  PONV Risk Score and Plan: 4 or greater and Ondansetron, Dexamethasone, Midazolam and Treatment may vary due to age or medical condition  Airway Management Planned: LMA  Additional Equipment:   Intra-op Plan:   Post-operative Plan: Extubation in OR  Informed Consent: I have reviewed the patients History and Physical, chart, labs and discussed the procedure including the risks, benefits and alternatives for the proposed anesthesia with the patient or authorized representative who has indicated his/her understanding and acceptance.     Plan Discussed with: CRNA and Surgeon  Anesthesia Plan Comments:         Anesthesia Quick Evaluation

## 2018-01-22 ENCOUNTER — Encounter (HOSPITAL_COMMUNITY): Payer: Self-pay | Admitting: *Deleted

## 2018-01-22 ENCOUNTER — Emergency Department (HOSPITAL_COMMUNITY)
Admission: EM | Admit: 2018-01-22 | Discharge: 2018-01-23 | Disposition: A | Discharge status: Home / Self Care | Payer: Managed Care, Other (non HMO) | Attending: Emergency Medicine | Admitting: Emergency Medicine

## 2018-01-22 ENCOUNTER — Other Ambulatory Visit: Payer: Self-pay

## 2018-01-22 DIAGNOSIS — R1031 Right lower quadrant pain: Secondary | ICD-10-CM | POA: Insufficient documentation

## 2018-01-22 DIAGNOSIS — R310 Gross hematuria: Secondary | ICD-10-CM

## 2018-01-22 DIAGNOSIS — J45909 Unspecified asthma, uncomplicated: Secondary | ICD-10-CM | POA: Diagnosis not present

## 2018-01-22 DIAGNOSIS — R109 Unspecified abdominal pain: Secondary | ICD-10-CM

## 2018-01-22 MED ORDER — HYDROMORPHONE HCL 1 MG/ML IJ SOLN
0.5000 mg | Freq: Once | INTRAMUSCULAR | Status: AC
Start: 2018-01-22 — End: 2018-01-23
  Administered 2018-01-23: 0.5 mg via INTRAVENOUS
  Filled 2018-01-22: qty 1

## 2018-01-22 MED ORDER — ONDANSETRON HCL 4 MG/2ML IJ SOLN
4.0000 mg | Freq: Once | INTRAMUSCULAR | Status: AC
  Administered 2018-01-23: 4 mg via INTRAVENOUS
  Filled 2018-01-22: qty 2

## 2018-01-22 MED ORDER — SODIUM CHLORIDE 0.9 % IV BOLUS (SEPSIS)
1000.0000 mL | Freq: Once | INTRAVENOUS | Status: AC
  Administered 2018-01-23: 1000 mL via INTRAVENOUS

## 2018-01-22 NOTE — ED Notes (Signed)
Pt ambulated to the bathroom without assistance. Urine sample at bedside.

## 2018-01-22 NOTE — ED Triage Notes (Signed)
Pt reports lithotripsy yesterday, still having right flank pain, hematuria, and only urinating small amounts. Last took naproxen for pain

## 2018-01-22 NOTE — ED Notes (Signed)
60 ml on bladder scan in triage

## 2018-01-22 NOTE — ED Provider Notes (Signed)
[facility]-EMERGENCY DEPT Provider Note   CSN: [id number] Arrival date & time: 01/22/18  2050     History   Chief Complaint Chief Complaint  Patient presents with  . Flank Pain    HPI Glenice Ciccone is a 37 y.o. female.  The history is provided by the patient.  Flank Pain  This is a new problem. The current episode started yesterday. The problem occurs constantly. The problem has been gradually worsening. Associated symptoms include abdominal pain. Pertinent negatives include no chest pain. Exacerbated by: urination. Nothing relieves the symptoms. Treatments tried: oxycodone. The treatment provided no relief.  Patient with history of multiple kidney stones presents with right flank and abdominal pain.  She reports having a right ureteral stent and lithotripsy done yesterday.  Her pain is increasing.  She reports nausea vomiting.  No recorded fevers.  She reports hematuria and difficulty urinating.  Past Medical History:  Diagnosis Date  . Asthma   . History of kidney stones   . Kidney stone    x 14 lithrotripsies  . PONV (postoperative nausea and vomiting)     There are no active problems to display for this patient.   Past Surgical History:  Procedure Laterality Date  . CESAREAN SECTION     x 4  . LITHOTRIPSY    . TONSILLECTOMY       OB History   None      Home Medications    Prior to Admission medications   Medication Sig Start Date End Date Taking? Authorizing Provider  oxybutynin (DITROPAN) 5 MG tablet Take 1 tablet (5 mg total) by mouth every 8 (eight) hours as needed for bladder spasms. 01/21/18  Yes Ray Church III, MD  oxyCODONE (ROXICODONE) 5 MG immediate release tablet Take 1 tablet (5 mg total) by mouth every 8 (eight) hours as needed. 01/21/18 01/21/19 Yes Ray Church III, MD  tamsulosin (FLOMAX) 0.4 MG CAPS capsule Take 1 capsule (0.4 mg total) by mouth daily. 01/21/18  Yes Ray Church III, MD  amoxicillin-clavulanate  (AUGMENTIN) 875-125 MG tablet Take 1 tablet by mouth every 12 (twelve) hours. Patient not taking: Reported on 01/22/2018 12/17/17   Elson Areas, PA-C  cephALEXin (KEFLEX) 500 MG capsule Take 1 capsule (500 mg total) by mouth 3 (three) times daily. Patient not taking: Reported on 01/22/2018 12/30/17   Jaynie Crumble, PA-C  ketorolac (TORADOL) 10 MG tablet Take 10 mg by mouth every 6 (six) hours as needed. for pain 12/06/17   [provider]  ondansetron (ZOFRAN ODT) 4 MG disintegrating tablet Take 1 tablet (4 mg total) by mouth every 8 (eight) hours as needed for nausea or vomiting. Patient not taking: Reported on 01/22/2018 12/17/17   Elson Areas, PA-C  ondansetron (ZOFRAN ODT) 4 MG disintegrating tablet Take 1 tablet (4 mg total) by mouth every 8 (eight) hours as needed for nausea or vomiting. Patient not taking: Reported on 12/30/2017 12/17/17   Elson Areas, PA-C  oxyCODONE-acetaminophen (PERCOCET/ROXICET) 5-325 MG tablet Take 2 tablets by mouth every 4 (four) hours as needed for severe pain. Patient not taking: Reported on 01/22/2018 12/17/17   Elson Areas, PA-C  oxyCODONE-acetaminophen (PERCOCET/ROXICET) 5-325 MG tablet Take 1 tablet by mouth every 4 (four) hours as needed for severe pain. Patient not taking: Reported on 12/30/2017 12/17/17   Elson Areas, PA-C    Family History No family history on file.  Social History Social History   Tobacco Use  .  Smoking status: Never Smoker  . Smokeless tobacco: Never Used  Substance Use Topics  . Alcohol use: Never    Frequency: Never  . Drug use: Never     Allergies   Demerol [meperidine] and Sulfa antibiotics   Review of Systems Review of Systems  Constitutional: Negative for fever.  Cardiovascular: Negative for chest pain.  Gastrointestinal: Positive for abdominal pain and vomiting.  Genitourinary: Positive for difficulty urinating, flank pain and hematuria.  All other systems reviewed and are  negative.    Physical Exam Updated Vital Signs BP 109/70 (BP Location: Right Arm)   Pulse 67   Temp 98.8 F (37.1 C) (Oral)   Resp 14   Ht 1.803 m (5\' 11" )   Wt 69.4 kg (153 lb)   LMP 01/16/2018   SpO2 100%   BMI 21.34 kg/m   Physical Exam CONSTITUTIONAL: Well developed/well nourished, uncomfortable appearing HEAD: Normocephalic/atraumatic EYES: EOMI/PERRL ENMT: Mucous membranes moist NECK: supple no meningeal signs SPINE/BACK:entire spine nontender CV: S1/S2 noted, no murmurs/rubs/gallops noted LUNGS: Lungs are clear to auscultation bilaterally, no apparent distress ABDOMEN: soft, nontender, no rebound or guarding, bowel sounds noted throughout abdomen GU:no cva tenderness NEURO: Pt is awake/alert/appropriate, moves all extremitiesx4.  No facial droop.   EXTREMITIES: pulses normal/equal, full ROM SKIN: warm, color normal PSYCH: no abnormalities of mood noted, alert and oriented to situation   ED Treatments / Results  Labs (all labs ordered are listed, but only abnormal results are displayed) Labs Reviewed  URINALYSIS, ROUTINE W REFLEX MICROSCOPIC - Abnormal; Notable for the following components:      Result Value   Color, Urine RED (*)    APPearance CLOUDY (*)    Glucose, UA   (*)    Value: TEST NOT REPORTED DUE TO COLOR INTERFERENCE OF URINE PIGMENT   Hgb urine dipstick   (*)    Value: TEST NOT REPORTED DUE TO COLOR INTERFERENCE OF URINE PIGMENT   Bilirubin Urine   (*)    Value: TEST NOT REPORTED DUE TO COLOR INTERFERENCE OF URINE PIGMENT   Ketones, ur   (*)    Value: TEST NOT REPORTED DUE TO COLOR INTERFERENCE OF URINE PIGMENT   Protein, ur   (*)    Value: TEST NOT REPORTED DUE TO COLOR INTERFERENCE OF URINE PIGMENT   Nitrite   (*)    Value: TEST NOT REPORTED DUE TO COLOR INTERFERENCE OF URINE PIGMENT   Leukocytes, UA   (*)    Value: TEST NOT REPORTED DUE TO COLOR INTERFERENCE OF URINE PIGMENT   All other components within normal limits  URINALYSIS,  MICROSCOPIC (REFLEX) - Abnormal; Notable for the following components:   Bacteria, UA RARE (*)    All other components within normal limits  I-STAT CHEM 8, ED - Abnormal; Notable for the following components:   Hemoglobin 11.9 (*)    HCT 35.0 (*)    All other components within normal limits  URINE CULTURE  I-STAT BETA HCG BLOOD, ED ([facility], [facility], [facility] ONLY)    EKG None  Radiology Dg Abdomen 1 View  Result Date: 01/23/2018 CLINICAL DATA:  Lithotripsy 2 days ago.  Increased pain EXAM: ABDOMEN - 1 VIEW COMPARISON:  Retrograde pyelogram 01/21/2018 FINDINGS: Recent right renal stone manipulation. At least 3 right renal calculi are seen today, measuring up to 5 mm at the upper pole. Right ureteral stent in expected position. Despite direct overlap, a rounded calcification in the right hemipelvis is a superimposed phlebolith rather than ureteral stone when correlated  with 12/17/2017 CT. Left pelvic phleboliths also seen. IMPRESSION: 1. Right ureteral stent is in good position. 2. Right nephrolithiasis. 3. Formed stool in multiple colonic segments, please correlate for constipation. Electronically Signed   By: Marnee Spring M.D.   On: 01/23/2018 00:53   Dg C-arm 1-60 Min-no Report  Result Date: 01/21/2018 Fluoroscopy was utilized by the requesting physician.  No radiographic interpretation.    Procedures Procedures (including critical care time)  Medications Ordered in ED Medications  sodium chloride 0.9 % bolus 1,000 mL (1,000 mLs Intravenous New Bag/Given 01/23/18 0016)  ondansetron (ZOFRAN) injection 4 mg (4 mg Intravenous Given 01/23/18 0011)  HYDROmorphone (DILAUDID) injection 0.5 mg (0.5 mg Intravenous Given 01/23/18 0014)     Initial Impression / Assessment and Plan / ED Course  I have reviewed the triage vital signs and the nursing notes.  Pertinent labs & imaging results that were available during my care of the patient were reviewed by me and considered in my medical decision making  (see chart for details). Narcotic database reviewed and considered in decision making    11:40 PM Patient presents for flank pain after her ureteral stent.  She reported difficulty urinating, but only 60 cc of urine on bladder scan. Plan to treat pain, check labs and KUB to evaluate ureteral stent placement 1:34 AM Patient improved and in no distress.  She is not septic appearing.  She is walking X-ray shows appropriate stent placement Will discharge home on Keflex.  She requests MiraLAX for constipation.  Short course of Percocet, but she has a few pills left postoperatively.  She will use those first, and if need be will fill her Percocet. She already has follow-up with urology next week. Final Clinical Impressions(s) / ED Diagnoses   Final diagnoses:  Flank pain  Gross hematuria    ED Discharge Orders        Ordered    oxyCODONE-acetaminophen (PERCOCET) 5-325 MG tablet  Every 8 hours PRN     01/23/18 0130    cephALEXin (KEFLEX) 500 MG capsule  3 times daily     01/23/18 0130    polyethylene glycol (MIRALAX / GLYCOLAX) packet  Daily     01/23/18 0130       Zadie Rhine, MD 01/23/18 [id number]

## 2018-01-23 ENCOUNTER — Emergency Department (HOSPITAL_COMMUNITY): Payer: Managed Care, Other (non HMO)

## 2018-01-23 ENCOUNTER — Encounter (HOSPITAL_COMMUNITY): Payer: Self-pay | Admitting: Urology

## 2018-01-23 DIAGNOSIS — R1031 Right lower quadrant pain: Secondary | ICD-10-CM | POA: Diagnosis not present

## 2018-01-23 LAB — URINALYSIS, ROUTINE W REFLEX MICROSCOPIC

## 2018-01-23 LAB — I-STAT CHEM 8, ED
BUN: 16 mg/dL (ref 6–20)
CALCIUM ION: 1.22 mmol/L (ref 1.15–1.40)
CHLORIDE: 106 mmol/L (ref 98–111)
Creatinine, Ser: 0.9 mg/dL (ref 0.44–1.00)
Glucose, Bld: 90 mg/dL (ref 70–99)
HEMATOCRIT: 35 % — AB (ref 36.0–46.0)
Hemoglobin: 11.9 g/dL — ABNORMAL LOW (ref 12.0–15.0)
POTASSIUM: 4.1 mmol/L (ref 3.5–5.1)
SODIUM: 139 mmol/L (ref 135–145)
TCO2: 25 mmol/L (ref 22–32)

## 2018-01-23 LAB — URINALYSIS, MICROSCOPIC (REFLEX): RBC / HPF: >50 RBC/hpf (ref 0–5)

## 2018-01-23 LAB — I-STAT BETA HCG BLOOD, ED (MC, WL, AP ONLY): I-stat hCG, quantitative: <5 m[IU]/mL (ref <5)

## 2018-01-23 MED ORDER — CEPHALEXIN 500 MG PO CAPS: 500.0000 mg | ORAL_CAPSULE | Freq: Three times a day (TID) | ORAL | 0 refills | Status: DC

## 2018-01-23 MED ORDER — POLYETHYLENE GLYCOL 3350 17 G PO PACK: 17.0000 g | PACK | Freq: Every day | ORAL | 0 refills | Status: DC

## 2018-01-23 MED ORDER — OXYCODONE-ACETAMINOPHEN 5-325 MG PO TABS: 1.0000 | ORAL_TABLET | Freq: Three times a day (TID) | ORAL | 0 refills | Status: DC | PRN

## 2018-01-23 NOTE — ED Notes (Signed)
Patient transported to X-ray 

## 2018-01-24 LAB — URINE CULTURE: CULTURE: NO GROWTH

## 2018-02-28 ENCOUNTER — Emergency Department (HOSPITAL_COMMUNITY)
Admission: EM | Admit: 2018-02-28 | Discharge: 2018-02-28 | Disposition: A | Discharge status: Home / Self Care | Payer: Managed Care, Other (non HMO) | Attending: Emergency Medicine | Admitting: Emergency Medicine

## 2018-02-28 ENCOUNTER — Encounter (HOSPITAL_COMMUNITY): Payer: Self-pay

## 2018-02-28 ENCOUNTER — Other Ambulatory Visit: Payer: Self-pay

## 2018-02-28 ENCOUNTER — Emergency Department (HOSPITAL_COMMUNITY): Payer: Managed Care, Other (non HMO)

## 2018-02-28 DIAGNOSIS — R1032 Left lower quadrant pain: Secondary | ICD-10-CM | POA: Insufficient documentation

## 2018-02-28 DIAGNOSIS — R110 Nausea: Secondary | ICD-10-CM | POA: Insufficient documentation

## 2018-02-28 DIAGNOSIS — N200 Calculus of kidney: Secondary | ICD-10-CM | POA: Insufficient documentation

## 2018-02-28 DIAGNOSIS — R109 Unspecified abdominal pain: Secondary | ICD-10-CM

## 2018-02-28 DIAGNOSIS — N3000 Acute cystitis without hematuria: Secondary | ICD-10-CM

## 2018-02-28 LAB — CBC WITH DIFFERENTIAL/PLATELET
Basophils Absolute: 0 10*3/uL (ref 0.0–0.1)
Basophils Relative: 0 %
Eosinophils Absolute: 0.1 10*3/uL (ref 0.0–0.7)
Eosinophils Relative: 1 %
HEMATOCRIT: 38.3 % (ref 36.0–46.0)
Hemoglobin: 12.4 g/dL (ref 12.0–15.0)
LYMPHS PCT: 34 %
Lymphs Abs: 1.6 10*3/uL (ref 0.7–4.0)
MCH: 29.3 pg (ref 26.0–34.0)
MCHC: 32.4 g/dL (ref 30.0–36.0)
MCV: 90.5 fL (ref 78.0–100.0)
MONO ABS: 0.2 10*3/uL (ref 0.1–1.0)
Monocytes Relative: 5 %
NEUTROS ABS: 2.8 10*3/uL (ref 1.7–7.7)
Neutrophils Relative %: 60 %
Platelets: 256 10*3/uL (ref 150–400)
RBC: 4.23 MIL/uL (ref 3.87–5.11)
RDW: 16.2 % — ABNORMAL HIGH (ref 11.5–15.5)
WBC: 4.7 10*3/uL (ref 4.0–10.5)

## 2018-02-28 LAB — URINALYSIS, ROUTINE W REFLEX MICROSCOPIC
Bilirubin Urine: NEGATIVE
Glucose, UA: NEGATIVE mg/dL
HGB URINE DIPSTICK: NEGATIVE
Ketones, ur: NEGATIVE mg/dL
Nitrite: NEGATIVE
Protein, ur: NEGATIVE mg/dL
SPECIFIC GRAVITY, URINE: 1.02 (ref 1.005–1.030)
pH: 7 (ref 5.0–8.0)

## 2018-02-28 LAB — POC URINE PREG, ED: PREG TEST UR: NEGATIVE

## 2018-02-28 LAB — I-STAT CHEM 8, ED
BUN: 15 mg/dL (ref 6–20)
CHLORIDE: 106 mmol/L (ref 98–111)
CREATININE: 0.8 mg/dL (ref 0.44–1.00)
Calcium, Ion: 1.23 mmol/L (ref 1.15–1.40)
GLUCOSE: 76 mg/dL (ref 70–99)
HCT: 40 % (ref 36.0–46.0)
Hemoglobin: 13.6 g/dL (ref 12.0–15.0)
POTASSIUM: 3.9 mmol/L (ref 3.5–5.1)
Sodium: 141 mmol/L (ref 135–145)
TCO2: 25 mmol/L (ref 22–32)

## 2018-02-28 LAB — URINALYSIS, MICROSCOPIC (REFLEX)

## 2018-02-28 MED ORDER — HYDROMORPHONE HCL 1 MG/ML IJ SOLN
0.5000 mg | Freq: Once | INTRAMUSCULAR | Status: AC
  Administered 2018-02-28: 0.5 mg via INTRAVENOUS
  Filled 2018-02-28: qty 1

## 2018-02-28 MED ORDER — ONDANSETRON HCL 4 MG/2ML IJ SOLN
4.0000 mg | Freq: Once | INTRAMUSCULAR | Status: AC
  Administered 2018-02-28: 4 mg via INTRAVENOUS
  Filled 2018-02-28: qty 2

## 2018-02-28 MED ORDER — SODIUM CHLORIDE 0.9 % IV BOLUS
500.0000 mL | Freq: Once | INTRAVENOUS | Status: AC
  Administered 2018-02-28: 500 mL via INTRAVENOUS

## 2018-02-28 MED ORDER — CEPHALEXIN 500 MG PO CAPS: 500.0000 mg | ORAL_CAPSULE | Freq: Two times a day (BID) | ORAL | 0 refills | Status: AC

## 2018-02-28 MED ORDER — ONDANSETRON 4 MG PO TBDP: 4.0000 mg | ORAL_TABLET | Freq: Three times a day (TID) | ORAL | 0 refills | Status: DC | PRN

## 2018-02-28 NOTE — Discharge Instructions (Addendum)
Your urine showed a small amount of bacteria. Given your history we will treat this as a UTI and have been sent home with a prescription for Keflex. Please take as prescribed. A culture of your urine will be sent off and you will be notified of the results. Your CT did not show any stones on the left side. Your pain may be due to a musculoskeletal strain or from the UTI. Please keep your follow-up appointment with your Urologist.   Get help right away if: Your tummy hurts or is swollen. You are short of breath. You feel sick to your stomach (nauseous) and it does not go away. You cannot stop throwing up (vomiting). You feel like you will pass out or you do pass out (faint). You have blood in your pee. You have a fever and your symptoms suddenly get worse.

## 2018-02-28 NOTE — ED Triage Notes (Signed)
Patient c/o intermittent left flank pain x 3 weeks and dysuria since yesterday. Patient states she has a known kidney stone.

## 2018-02-28 NOTE — ED Provider Notes (Signed)
[facility]-EMERGENCY DEPT Provider Note   CSN: [id number] Arrival date & time: 02/28/18  9562   History   Chief Complaint Chief Complaint  Patient presents with  . Flank Pain  . Dysuria    HPI Jillian Mcgrath is a 37 y.o. female with a past medical history significant for recurrent kidney stones with right uretal stent with lithotripsy presents for left sided flank pain. Pain does not radiate and is intermittent in nature. Pain is rated a 8/10. Movement makes the pain worse. Denies alleviating factors. Admits to dysuria and nausea. Denies hematuria, urinary frequency, chest pain, SOB, fever, chills, diarrhea, constipation, right sided flank pain. States she has an appointment with her urologist 03/02/18 for follow-up.   Past Medical History:  Diagnosis Date  . Asthma   . History of kidney stones   . Kidney stone    x 14 lithrotripsies  . PONV (postoperative nausea and vomiting)     There are no active problems to display for this patient.   Past Surgical History:  Procedure Laterality Date  . CESAREAN SECTION     x 4  . CYSTOSCOPY/URETEROSCOPY/HOLMIUM LASER/STENT PLACEMENT Left 01/21/2018   Procedure: CYSTOSCOPY /LEFT URETEROSCOPY/HOLMIUM LASER/LEFT STENT PLACEMENT;  Surgeon: Crista Elliot, MD;  Location: [facility];  Service: Urology;  Laterality: Left;  . LITHOTRIPSY    . TONSILLECTOMY       OB History   None      Home Medications    Prior to Admission medications   Medication Sig Start Date End Date Taking? Authorizing Provider  naproxen (NAPROSYN) 500 MG tablet Take 500 mg by mouth daily as needed for moderate pain.   Yes [provider]  cephALEXin (KEFLEX) 500 MG capsule Take 1 capsule (500 mg total) by mouth 2 (two) times daily for 7 days. 02/28/18 03/07/18  Granville Whitefield A, PA-C  ondansetron (ZOFRAN ODT) 4 MG disintegrating tablet Take 1 tablet (4 mg total) by mouth every 8 (eight) hours as needed for nausea or vomiting.  02/28/18   Myrna Vonseggern A, PA-C  oxybutynin (DITROPAN) 5 MG tablet Take 1 tablet (5 mg total) by mouth every 8 (eight) hours as needed for bladder spasms. Patient not taking: Reported on 02/28/2018 01/21/18   Crista Elliot, MD  oxyCODONE-acetaminophen (PERCOCET) 5-325 MG tablet Take 1 tablet by mouth every 8 (eight) hours as needed for severe pain. Patient not taking: Reported on 02/28/2018 01/23/18   Zadie Rhine, MD  polyethylene glycol [facility] Ethelene Hal) packet Take 17 g by mouth daily. Patient not taking: Reported on 02/28/2018 01/23/18   Zadie Rhine, MD  tamsulosin (FLOMAX) 0.4 MG CAPS capsule Take 1 capsule (0.4 mg total) by mouth daily. Patient not taking: Reported on 02/28/2018 01/21/18   Crista Elliot, MD    Family History History reviewed. No pertinent family history.  Social History Social History   Tobacco Use  . Smoking status: Never Smoker  . Smokeless tobacco: Never Used  Substance Use Topics  . Alcohol use: Never    Frequency: Never  . Drug use: Never     Allergies   Demerol [meperidine] and Sulfa antibiotics   Review of Systems Review of Systems  Constitutional: Negative for appetite change, chills, fatigue and fever.  Respiratory: Negative.   Cardiovascular: Negative.   Gastrointestinal: Positive for abdominal pain and nausea. Negative for blood in stool, constipation, diarrhea and vomiting.  Genitourinary: Positive for dysuria and flank pain. Negative for decreased urine volume, frequency, hematuria  and urgency.  Neurological: Negative for dizziness, weakness and light-headedness.  All other systems reviewed and are negative.    Physical Exam Updated Vital Signs BP (!) 114/54   Pulse 79   Temp 98.9 F (37.2 C) (Oral)   Resp 16   Ht 6' (1.829 m)   Wt 69.4 kg   LMP 01/28/2018 (Approximate) Comment: neg preg test 02/28/2018  SpO2 100%   BMI 20.75 kg/m   Physical Exam  Constitutional: She appears well-developed and well-nourished.  No distress.  HENT:  Head: Atraumatic.  Eyes: Pupils are equal, round, and reactive to light.  Neck: Normal range of motion. Neck supple.  Cardiovascular: Normal rate and regular rhythm.  Pulmonary/Chest: Effort normal. No respiratory distress.  Abdominal: Soft. Bowel sounds are normal. She exhibits no distension, no fluid wave, no abdominal bruit, no pulsatile midline mass and no mass. There is no hepatosplenomegaly. There is CVA tenderness. There is no rigidity, no rebound and no guarding.  Musculoskeletal: Normal range of motion.  Neurological: She is alert.  Skin: Skin is warm and dry. She is not diaphoretic.  Psychiatric: She has a normal mood and affect.  Nursing note and vitals reviewed.    ED Treatments / Results  Labs (all labs ordered are listed, but only abnormal results are displayed) Labs Reviewed  URINALYSIS, ROUTINE W REFLEX MICROSCOPIC - Abnormal; Notable for the following components:      Result Value   Leukocytes, UA SMALL (*)    All other components within normal limits  CBC WITH DIFFERENTIAL/PLATELET - Abnormal; Notable for the following components:   RDW 16.2 (*)    All other components within normal limits  URINALYSIS, MICROSCOPIC (REFLEX) - Abnormal; Notable for the following components:   Bacteria, UA FEW (*)    All other components within normal limits  POC URINE PREG, ED  I-STAT CHEM 8, ED    EKG None  Radiology Ct Renal Stone Study  Result Date: 02/28/2018 CLINICAL DATA:  Intermittent left flank pain for the past 3 weeks. Dysuria since yesterday. The patient reports a known kidney stone. EXAM: CT ABDOMEN AND PELVIS WITHOUT CONTRAST TECHNIQUE: Multidetector CT imaging of the abdomen and pelvis was performed following the standard protocol without IV contrast. COMPARISON:  Abdomen pelvis radiograph dated 01/23/2018 and abdomen and pelvis CT dated 12/17/2017. FINDINGS: Lower chest: Clear lung bases. Hepatobiliary: No focal liver abnormality is seen. No  gallstones, gallbladder wall thickening, or biliary dilatation. Pancreas: Unremarkable. No pancreatic ductal dilatation or surrounding inflammatory changes. Spleen: Normal in size without focal abnormality. Adrenals/Urinary Tract: Normal appearing adrenal glands. 5 mm upper pole right renal calculus and 3 mm lower pole right renal calculus. Multiple additional tiny medullary calculi in both kidneys. No bladder or ureteral calculi and no hydronephrosis. The previously demonstrated right ureteral stent has been removed. Stomach/Bowel: Stomach is within normal limits. Appendix appears normal. No evidence of bowel wall thickening, distention, or inflammatory changes. Again noted is prominent stool in the colon. Vascular/Lymphatic: No significant vascular findings are present. No enlarged abdominal or pelvic lymph nodes. Reproductive: Uterus and bilateral adnexa are unremarkable. Other: No abdominal wall hernia or abnormality. No abdominopelvic ascites. Musculoskeletal: Small bone islands. Mild lumbar and lower thoracic spine degenerative changes. IMPRESSION: 1. 5 mm and 3 mm nonobstructing right renal calculi. 2. 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 3. No ureteral calculi or hydronephrosis. 4. Prominent stool in the colon. Electronically Signed   By: Beckie SaltsSteven  Reid M.D.   On: 02/28/2018 10:09  Procedures Procedures (including critical care time)  Medications Ordered in ED Medications  ondansetron (ZOFRAN) injection 4 mg (4 mg Intravenous Given 02/28/18 0936)  HYDROmorphone (DILAUDID) injection 0.5 mg (0.5 mg Intravenous Given 02/28/18 0936)  sodium chloride 0.9 % bolus 500 mL (0 mLs Intravenous Stopped 02/28/18 1102)     Initial Impression / Assessment and Plan / ED Course  I have reviewed the triage vital signs and the nursing notes as well as the past medical records.  Pertinent labs & imaging results that were available during my care of the patient were reviewed by me  and considered in my medical decision making (see chart for details).  Patient presents with left flank pain. Will get labs, treat pain and order CT Stone protocol to evaluate for stone complication.  On reevaluation pain and nausea are well controlled. CT without evidence of left renal calculi. No hydronephrosis. 1. 5 mm and 3 mm nonobstructing right renal calculi. 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 No ureteral calculi or hydronephrosis. Labs negative. Patient is afebrile, non-toxic appearing. Abdomen soft, Mild CVA tenderness to left flank. Patient feels like she has the beginnings of a UTI, small Leukocytes and bacteria. Negative nitrites. Will treat for UTI and culture urine.   Afebrile, non-septic or ill appearing. Patient does have follow-up appointment for 03/01/18 with her Urologist. Will d/c on Keflex.  Advised patient to keep appointment with Urologist. Patient has pain medication at home. Return precautions given. Patient and boyfriend voice understanding.    Final Clinical Impressions(s) / ED Diagnoses   Final diagnoses:  Flank pain  Acute cystitis without hematuria    ED Discharge Orders         Ordered    ondansetron (ZOFRAN ODT) 4 MG disintegrating tablet  Every 8 hours PRN     02/28/18 1053    cephALEXin (KEFLEX) 500 MG capsule  2 times daily     02/28/18 1053           Charolette Bultman A, PA-C 02/28/18 1111    Mesner, Barbara CowerJason, MD 02/28/18 1617

## 2018-02-28 NOTE — ED Notes (Signed)
BLADDER SCAN RESULTED 0mL's 

## 2018-03-01 LAB — URINE CULTURE: Culture: <10000 — AB

## 2018-03-24 ENCOUNTER — Encounter (HOSPITAL_COMMUNITY): Payer: Self-pay

## 2018-03-24 ENCOUNTER — Emergency Department (HOSPITAL_COMMUNITY): Payer: Managed Care, Other (non HMO)

## 2018-03-24 ENCOUNTER — Emergency Department (HOSPITAL_COMMUNITY)
Admission: EM | Admit: 2018-03-24 | Discharge: 2018-03-24 | Disposition: A | Discharge status: Home / Self Care | Payer: Managed Care, Other (non HMO) | Attending: Emergency Medicine | Admitting: Emergency Medicine

## 2018-03-24 DIAGNOSIS — Z23 Encounter for immunization: Secondary | ICD-10-CM | POA: Diagnosis not present

## 2018-03-24 DIAGNOSIS — R55 Syncope and collapse: Secondary | ICD-10-CM | POA: Insufficient documentation

## 2018-03-24 DIAGNOSIS — R109 Unspecified abdominal pain: Secondary | ICD-10-CM

## 2018-03-24 DIAGNOSIS — Y9389 Activity, other specified: Secondary | ICD-10-CM | POA: Diagnosis not present

## 2018-03-24 DIAGNOSIS — J45909 Unspecified asthma, uncomplicated: Secondary | ICD-10-CM | POA: Diagnosis not present

## 2018-03-24 DIAGNOSIS — Y929 Unspecified place or not applicable: Secondary | ICD-10-CM | POA: Insufficient documentation

## 2018-03-24 DIAGNOSIS — S01112A Laceration without foreign body of left eyelid and periocular area, initial encounter: Secondary | ICD-10-CM | POA: Diagnosis not present

## 2018-03-24 DIAGNOSIS — Z87442 Personal history of urinary calculi: Secondary | ICD-10-CM | POA: Insufficient documentation

## 2018-03-24 DIAGNOSIS — X58XXXA Exposure to other specified factors, initial encounter: Secondary | ICD-10-CM | POA: Diagnosis not present

## 2018-03-24 DIAGNOSIS — Y999 Unspecified external cause status: Secondary | ICD-10-CM | POA: Diagnosis not present

## 2018-03-24 LAB — I-STAT BETA HCG BLOOD, ED (MC, WL, AP ONLY): I-stat hCG, quantitative: <5 m[IU]/mL (ref <5)

## 2018-03-24 LAB — BASIC METABOLIC PANEL
Anion gap: 7 (ref 5–15)
BUN: 13 mg/dL (ref 6–20)
CALCIUM: 8.7 mg/dL — AB (ref 8.9–10.3)
CO2: 25 mmol/L (ref 22–32)
CREATININE: 0.8 mg/dL (ref 0.44–1.00)
Chloride: 105 mmol/L (ref 98–111)
GFR calc Af Amer: >60 mL/min (ref >60)
Glucose, Bld: 119 mg/dL — ABNORMAL HIGH (ref 70–99)
Potassium: 3.7 mmol/L (ref 3.5–5.1)
SODIUM: 137 mmol/L (ref 135–145)

## 2018-03-24 LAB — CBC
HCT: 34.5 % — ABNORMAL LOW (ref 36.0–46.0)
Hemoglobin: 11 g/dL — ABNORMAL LOW (ref 12.0–15.0)
MCH: 29.5 pg (ref 26.0–34.0)
MCHC: 31.9 g/dL (ref 30.0–36.0)
MCV: 92.5 fL (ref 78.0–100.0)
PLATELETS: 219 10*3/uL (ref 150–400)
RBC: 3.73 MIL/uL — ABNORMAL LOW (ref 3.87–5.11)
RDW: 15.3 % (ref 11.5–15.5)
WBC: 8.4 10*3/uL (ref 4.0–10.5)

## 2018-03-24 LAB — URINALYSIS, ROUTINE W REFLEX MICROSCOPIC
Bilirubin Urine: NEGATIVE
GLUCOSE, UA: NEGATIVE mg/dL
Hgb urine dipstick: NEGATIVE
KETONES UR: NEGATIVE mg/dL
LEUKOCYTES UA: NEGATIVE
NITRITE: NEGATIVE
PROTEIN: NEGATIVE mg/dL
Specific Gravity, Urine: 1.013 (ref 1.005–1.030)
pH: 6 (ref 5.0–8.0)

## 2018-03-24 MED ORDER — KETOROLAC TROMETHAMINE 30 MG/ML IJ SOLN
30.0000 mg | Freq: Once | INTRAMUSCULAR | Status: AC
  Administered 2018-03-24: 30 mg via INTRAVENOUS
  Filled 2018-03-24: qty 1

## 2018-03-24 MED ORDER — SODIUM CHLORIDE 0.9 % IV BOLUS
1000.0000 mL | Freq: Once | INTRAVENOUS | Status: AC
Start: 2018-03-24 — End: 2018-03-24
  Administered 2018-03-24: 1000 mL via INTRAVENOUS

## 2018-03-24 MED ORDER — TETANUS-DIPHTH-ACELL PERTUSSIS 5-2.5-18.5 LF-MCG/0.5 IM SUSP
0.5000 mL | Freq: Once | INTRAMUSCULAR | Status: AC
  Administered 2018-03-24: 0.5 mL via INTRAMUSCULAR
  Filled 2018-03-24: qty 0.5

## 2018-03-24 MED ORDER — SODIUM CHLORIDE 0.9 % IV BOLUS
1000.0000 mL | Freq: Once | INTRAVENOUS | Status: AC
  Administered 2018-03-24: 1000 mL via INTRAVENOUS

## 2018-03-24 MED ORDER — LIDOCAINE-EPINEPHRINE 1 %-1:100000 IJ SOLN
10.0000 mL | Freq: Once | INTRAMUSCULAR | Status: AC
  Administered 2018-03-24: 10 mL
  Filled 2018-03-24: qty 10

## 2018-03-24 NOTE — ED Notes (Addendum)
Orthostatics attempted. Pt unable to complete standing orthostatics due to dizziness.

## 2018-03-24 NOTE — ED Notes (Signed)
Pt left with all belongings. Refused dc BP

## 2018-03-24 NOTE — ED Provider Notes (Signed)
[facility] EMERGENCY DEPARTMENT Provider Note   CSN: [id number] Arrival date & time: 03/24/18  0158     History   Chief Complaint Chief Complaint  Patient presents with  . Loss of Consciousness    HPI Jillian Mcgrath is a 37 y.o. female.  37 year old female with a long-standing history of kidney stones as well as asthma presents to the emergency department for evaluation of syncope.  He says she was getting up to void when she had prodromal symptoms of lightheadedness.  Syncopized, striking her head on a wooden table.  Has a laceration to her right eyebrow.  Notes some nausea since her fall.  She has also been experiencing intermittent right flank pain consistent with prior kidney stones.  EMS reports EtOH on board.  She was given 4 mg of Zofran prior to arrival with improvement in her nausea.  She denies any recent fevers.  Has been experiencing some intermittent dysuria.  No hematuria.  Reports one prior episode of syncope in 2011.  Last Tdap unknown.     Past Medical History:  Diagnosis Date  . Asthma   . History of kidney stones   . Kidney stone    x 14 lithrotripsies  . PONV (postoperative nausea and vomiting)     There are no active problems to display for this patient.   Past Surgical History:  Procedure Laterality Date  . CESAREAN SECTION     x 4  . CYSTOSCOPY/URETEROSCOPY/HOLMIUM LASER/STENT PLACEMENT Left 01/21/2018   Procedure: CYSTOSCOPY /LEFT URETEROSCOPY/HOLMIUM LASER/LEFT STENT PLACEMENT;  Surgeon: Crista Elliot, MD;  Location: [facility];  Service: Urology;  Laterality: Left;  . LITHOTRIPSY    . TONSILLECTOMY       OB History   None      Home Medications    Prior to Admission medications   Medication Sig Start Date End Date Taking? Authorizing Provider  ondansetron (ZOFRAN ODT) 4 MG disintegrating tablet Take 1 tablet (4 mg total) by mouth every 8 (eight) hours as needed for nausea or vomiting. Patient not taking: Reported on  03/24/2018 02/28/18   Henderly, Britni A, PA-C  oxybutynin (DITROPAN) 5 MG tablet Take 1 tablet (5 mg total) by mouth every 8 (eight) hours as needed for bladder spasms. Patient not taking: Reported on 02/28/2018 01/21/18   Crista Elliot, MD  oxyCODONE-acetaminophen (PERCOCET) 5-325 MG tablet Take 1 tablet by mouth every 8 (eight) hours as needed for severe pain. Patient not taking: Reported on 02/28/2018 01/23/18   Zadie Rhine, MD  polyethylene glycol [facility] / Ethelene Hal) packet Take 17 g by mouth daily. Patient not taking: Reported on 02/28/2018 01/23/18   Zadie Rhine, MD  tamsulosin (FLOMAX) 0.4 MG CAPS capsule Take 1 capsule (0.4 mg total) by mouth daily. Patient not taking: Reported on 02/28/2018 01/21/18   Crista Elliot, MD    Family History No family history on file.  Social History Social History   Tobacco Use  . Smoking status: Never Smoker  . Smokeless tobacco: Never Used  Substance Use Topics  . Alcohol use: Never    Frequency: Never  . Drug use: Never     Allergies   Demerol [meperidine] and Sulfa antibiotics   Review of Systems Review of Systems Ten systems reviewed and are negative for acute change, except as noted in the HPI.    Physical Exam Updated Vital Signs BP (!) 108/47   Pulse 60   Temp 97.8 F (36.6 C) (Oral)  Resp (!) 21   LMP 02/13/2018   SpO2 100%   Physical Exam  Constitutional: She is oriented to person, place, and time. She appears well-developed and well-nourished. No distress.  Nontoxic appearing and in no distress  HENT:  Head: Normocephalic.  Laceration to the lateral left eyebrow  Eyes: Conjunctivae and EOM are normal. No scleral icterus.  Neck: Normal range of motion.  Cardiovascular: Normal rate, regular rhythm and intact distal pulses.  Pulmonary/Chest: Effort normal. No stridor. No respiratory distress. She has no wheezes.  Respirations even and unlabored  Musculoskeletal: Normal range of motion.  Neurological:  She is alert and oriented to person, place, and time. She exhibits normal muscle tone. Coordination normal.  Moving all extremities spontaneously.  Skin: Skin is warm and dry. No rash noted. She is not diaphoretic. No erythema. No pallor.  Psychiatric: She has a normal mood and affect. Her behavior is normal.  Nursing note and vitals reviewed.    ED Treatments / Results  Labs (all labs ordered are listed, but only abnormal results are displayed) Labs Reviewed  BASIC METABOLIC PANEL - Abnormal; Notable for the following components:      Result Value   Glucose, Bld 119 (*)    Calcium 8.7 (*)    All other components within normal limits  CBC - Abnormal; Notable for the following components:   RBC 3.73 (*)    Hemoglobin 11.0 (*)    HCT 34.5 (*)    All other components within normal limits  URINALYSIS, ROUTINE W REFLEX MICROSCOPIC  I-STAT BETA HCG BLOOD, ED ([facility], [facility], [facility] ONLY)    EKG ED ECG REPORT   Date: 03/25/2018  Rate: 75  Rhythm: normal sinus rhythm  QRS Axis: normal  Intervals: normal  ST/T Wave abnormalities: normal  Conduction Disutrbances:none  Narrative Interpretation: Normal sinus rhythm  Old EKG Reviewed: none available  I have personally reviewed the EKG tracing and agree with the computerized printout as noted.   Radiology US Renal  Result Date: 03/24/2018 CLINICAL DATA:  Right flank pain.  History of stones. EXAM: RENAL / URINARY TRACT ULTRASOUND COMPLETE COMPARISON:  CT abdomen and pelvis 02/28/2018 FINDINGS: Right Kidney: Length: 10.7 cm. 6 mm echogenic focus in the upper pole consistent with stone as seen on previous CT. No hydronephrosis. Renal parenchymal echotexture and thickness is normal. Left Kidney: Left kidney is not well seen due to bowel gas. Bladder: No bladder wall thickening or filling defects identified. IMPRESSION: 1. 6 mm stone in the upper pole right kidney.  No hydronephrosis. 2. Left kidney not well seen due to bowel gas. 3. Bladder is  unremarkable. Electronically Signed   By: Burman Nieves M.D.   On: 03/24/2018 04:44    Procedures Procedures (including critical care time)  LACERATION REPAIR Performed by: Antony Madura Authorized by: Antony Madura Consent: Verbal consent obtained. Risks and benefits: risks, benefits and alternatives were discussed Consent given by: patient Patient identity confirmed: provided demographic data Prepped and Draped in normal sterile fashion Wound explored  Laceration Location: L eyebrow  Laceration Length: 2.5cm  No Foreign Bodies seen or palpated  Anesthesia: local infiltration  Local anesthetic: lidocaine 1% with epinephrine  Anesthetic total: 2 ml  Irrigation method: syringe Amount of cleaning: standard  Skin closure: 4-0 chromic  Number of sutures: 3  Technique: simple interrupted  Patient tolerance: Patient tolerated the procedure well with no immediate complications.   Medications Ordered in ED Medications  sodium chloride 0.9 % bolus 1,000 mL (0 mLs  Intravenous Stopped 03/24/18 0709)  Tdap (BOOSTRIX) injection 0.5 mL (0.5 mLs Intramuscular Given 03/24/18 0333)  lidocaine-EPINEPHrine (XYLOCAINE W/EPI) 1 %-1:100000 (with pres) injection 10 mL (10 mLs Other Given by Other 03/24/18 0333)  sodium chloride 0.9 % bolus 1,000 mL (0 mLs Intravenous Stopped 03/24/18 0709)  ketorolac (TORADOL) 30 MG/ML injection 30 mg (30 mg Intravenous Given 03/24/18 1610)     Initial Impression / Assessment and Plan / ED Course  I have reviewed the triage vital signs and the nursing notes.  Pertinent labs & imaging results that were available during my care of the patient were reviewed by me and considered in my medical decision making (see chart for details).     37 year old female presents to the emergency department for evaluation of syncope causing her to strike her head on a wooden table.  Sustained laceration to her left eyebrow which was repaired in the emergency department without  complication.  Tdap updated.  Suspect syncope secondary to vasovagal response.  Patient initially with drop in blood pressure during orthostatic testing.  This improved on repeat orthostatic vital signs following IV fluid hydration.  Her laboratory work-up is reassuring.  [place] syncope score is negative.  Patient also reporting ongoing flank pain.  She does have a history of kidney stones.  No evidence of hematuria today.  Kidney function is normal.  No evidence of hydronephrosis on ultrasound.  Recent CT scan on 02/28/2018 for similar symptoms without evidence of obstructing ureterolithiasis.  She has had some improvement in her pain following Toradol.  Narcotics withheld given concern for hypotension.  I have encouraged the patient to follow-up with her urologist.  No indication for further emergent work-up at this time.  Return precautions discussed and provided. Patient discharged in stable condition with no unaddressed concerns.   Final Clinical Impressions(s) / ED Diagnoses   Final diagnoses:  Vasovagal syncope  Right flank pain  Laceration of left eyebrow, initial encounter    ED Discharge Orders    None       Antony Madura, PA-C 03/25/18 0507    Shon Baton, MD 03/25/18 [phone]

## 2018-03-24 NOTE — ED Triage Notes (Signed)
Pt comes via [facility] for syncopal episode around 1 am, hit head on kitchen table, lac to R eyebrow.  Recent diagnosis of kidney stones also having R flank pain. ETOH on board, PTA received 4 mg of zofran

## 2018-03-24 NOTE — Discharge Instructions (Signed)
We believe that your episode of passing out was due to dehydration.  We recommend that you drink plenty of fluids to prevent recurrence of symptoms.  Your blood work in the emergency department was reassuring.  Follow-up with your primary care doctor for further evaluation of your symptoms.  You do not require removal of your stitches as they will dissolve on their own.  Avoid submerging your head in stagnant water such as while bathing or swimming.  You may shower normally.  Apply topical bacitracin to prevent infection.  Follow-up with your urologist regarding your ongoing flank pain.

## 2018-03-30 ENCOUNTER — Emergency Department (HOSPITAL_COMMUNITY)
Admission: EM | Admit: 2018-03-30 | Discharge: 2018-03-30 | Disposition: A | Discharge status: Home / Self Care | Payer: Managed Care, Other (non HMO) | Attending: Emergency Medicine | Admitting: Emergency Medicine

## 2018-03-30 ENCOUNTER — Other Ambulatory Visit: Payer: Self-pay

## 2018-03-30 ENCOUNTER — Encounter (HOSPITAL_COMMUNITY): Payer: Self-pay

## 2018-03-30 ENCOUNTER — Emergency Department (HOSPITAL_COMMUNITY): Payer: Managed Care, Other (non HMO)

## 2018-03-30 DIAGNOSIS — R109 Unspecified abdominal pain: Secondary | ICD-10-CM | POA: Diagnosis present

## 2018-03-30 DIAGNOSIS — J45909 Unspecified asthma, uncomplicated: Secondary | ICD-10-CM | POA: Diagnosis not present

## 2018-03-30 DIAGNOSIS — Z87442 Personal history of urinary calculi: Secondary | ICD-10-CM | POA: Insufficient documentation

## 2018-03-30 DIAGNOSIS — R319 Hematuria, unspecified: Secondary | ICD-10-CM | POA: Insufficient documentation

## 2018-03-30 LAB — I-STAT BETA HCG BLOOD, ED (MC, WL, AP ONLY)

## 2018-03-30 LAB — URINALYSIS, ROUTINE W REFLEX MICROSCOPIC
Bilirubin Urine: NEGATIVE
Glucose, UA: NEGATIVE mg/dL
HGB URINE DIPSTICK: NEGATIVE
Ketones, ur: NEGATIVE mg/dL
Leukocytes, UA: NEGATIVE
NITRITE: NEGATIVE
Protein, ur: NEGATIVE mg/dL
Specific Gravity, Urine: 1.018 (ref 1.005–1.030)
pH: 6 (ref 5.0–8.0)

## 2018-03-30 MED ORDER — METHOCARBAMOL 750 MG PO TABS: 750.0000 mg | ORAL_TABLET | Freq: Four times a day (QID) | ORAL | 0 refills | Status: DC

## 2018-03-30 MED ORDER — TRAMADOL HCL 50 MG PO TABS: 50.0000 mg | ORAL_TABLET | Freq: Four times a day (QID) | ORAL | 0 refills | Status: DC | PRN

## 2018-03-30 NOTE — ED Provider Notes (Signed)
[facility]-EMERGENCY DEPT Provider Note   CSN: [id number] Arrival date & time: 03/30/18  4098     History   Chief Complaint Chief Complaint  Patient presents with  . Flank Pain    HPI Jillian Mcgrath is a 37 y.o. female.  This is a 37 year old female with a known history of kidney stones presents with right-sided flank pain x24 hours.  Pain is been constant in nature and is similar to her prior kidney stones.  She noted hematuria this morning but denies any fever or chills.  No dysuria.  No vaginal bleeding or discharge.  States that her last menstrual period was over a month ago and is concerned that she may be pregnant but denies any pelvic discomfort.  Old records reviewed and patient has #3 renal CTs done this year the last 2 which did not show any intraureteral stones.  Nothing makes her symptoms better or worse and no treatment used prior to arrival.     Past Medical History:  Diagnosis Date  . Asthma   . History of kidney stones   . Kidney stone    x 14 lithrotripsies  . PONV (postoperative nausea and vomiting)     There are no active problems to display for this patient.   Past Surgical History:  Procedure Laterality Date  . CESAREAN SECTION     x 4  . CYSTOSCOPY/URETEROSCOPY/HOLMIUM LASER/STENT PLACEMENT Left 01/21/2018   Procedure: CYSTOSCOPY /LEFT URETEROSCOPY/HOLMIUM LASER/LEFT STENT PLACEMENT;  Surgeon: Crista Elliot, MD;  Location: [facility];  Service: Urology;  Laterality: Left;  . LITHOTRIPSY    . TONSILLECTOMY       OB History   None      Home Medications    Prior to Admission medications   Medication Sig Start Date End Date Taking? Authorizing Provider  ondansetron (ZOFRAN ODT) 4 MG disintegrating tablet Take 1 tablet (4 mg total) by mouth every 8 (eight) hours as needed for nausea or vomiting. Patient not taking: Reported on 03/24/2018 02/28/18   Henderly, Britni A, PA-C  oxybutynin (DITROPAN) 5 MG tablet Take 1 tablet (5  mg total) by mouth every 8 (eight) hours as needed for bladder spasms. Patient not taking: Reported on 02/28/2018 01/21/18   Crista Elliot, MD  oxyCODONE-acetaminophen (PERCOCET) 5-325 MG tablet Take 1 tablet by mouth every 8 (eight) hours as needed for severe pain. Patient not taking: Reported on 02/28/2018 01/23/18   Zadie Rhine, MD  polyethylene glycol [facility] / Ethelene Hal) packet Take 17 g by mouth daily. Patient not taking: Reported on 02/28/2018 01/23/18   Zadie Rhine, MD  tamsulosin (FLOMAX) 0.4 MG CAPS capsule Take 1 capsule (0.4 mg total) by mouth daily. Patient not taking: Reported on 02/28/2018 01/21/18   Crista Elliot, MD    Family History No family history on file.  Social History Social History   Tobacco Use  . Smoking status: Never Smoker  . Smokeless tobacco: Never Used  Substance Use Topics  . Alcohol use: Never    Frequency: Never  . Drug use: Never     Allergies   Demerol [meperidine] and Sulfa antibiotics   Review of Systems Review of Systems  All other systems reviewed and are negative.    Physical Exam Updated Vital Signs BP (!) 103/52 (BP Location: Left Arm)   Pulse 75   Temp 98.3 F (36.8 C) (Oral)   Resp 18   LMP 03/05/2018 (Approximate)   SpO2 100%   Physical  Exam  Constitutional: She is oriented to person, place, and time. She appears well-developed and well-nourished.  Non-toxic appearance. No distress.  HENT:  Head: Normocephalic and atraumatic.  Eyes: Pupils are equal, round, and reactive to light. Conjunctivae, EOM and lids are normal.  Neck: Normal range of motion. Neck supple. No tracheal deviation present. No thyroid mass present.  Cardiovascular: Normal rate, regular rhythm and normal heart sounds. Exam reveals no gallop.  No murmur heard. Pulmonary/Chest: Effort normal and breath sounds normal. No stridor. No respiratory distress. She has no decreased breath sounds. She has no wheezes. She has no rhonchi. She has no  rales.  Abdominal: Soft. Normal appearance and bowel sounds are normal. She exhibits no distension. There is no tenderness. There is no rigidity, no rebound, no guarding and no CVA tenderness.  Musculoskeletal: Normal range of motion. She exhibits no edema or tenderness.  Neurological: She is alert and oriented to person, place, and time. She has normal strength. No cranial nerve deficit or sensory deficit. GCS eye subscore is 4. GCS verbal subscore is 5. GCS motor subscore is 6.  Skin: Skin is warm and dry. No abrasion and no rash noted.  Psychiatric: She has a normal mood and affect. Her speech is normal and behavior is normal.  Nursing note and vitals reviewed.    ED Treatments / Results  Labs (all labs ordered are listed, but only abnormal results are displayed) Labs Reviewed  URINE CULTURE  URINALYSIS, ROUTINE W REFLEX MICROSCOPIC  I-STAT BETA HCG BLOOD, ED ([facility], [facility], [facility] ONLY)    EKG None  Radiology No results found.  Procedures Procedures (including critical care time)  Medications Ordered in ED Medications - No data to display   Initial Impression / Assessment and Plan / ED Course  I have reviewed the triage vital signs and the nursing notes.  Pertinent labs & imaging results that were available during my care of the patient were reviewed by me and considered in my medical decision making (see chart for details).     Analysis negative for infection or blood.  Renal ultrasound was negative for hydronephrosis.  Suspect muscular skeletal etiology of her symptoms.  Will prescribe medications and return precautions given.  Final Clinical Impressions(s) / ED Diagnoses   Final diagnoses:  None    ED Discharge Orders    None       Lorre NickAllen, Diamon Reddinger, MD 03/30/18 1127

## 2018-03-30 NOTE — ED Notes (Signed)
UNSUCCESSFUL LAB COLLECTION ATTEMPT 

## 2018-03-31 LAB — URINE CULTURE: Culture: <10000 — AB

## 2018-04-17 ENCOUNTER — Other Ambulatory Visit: Payer: Self-pay

## 2018-04-17 ENCOUNTER — Encounter (HOSPITAL_COMMUNITY): Payer: Self-pay | Admitting: Emergency Medicine

## 2018-04-17 ENCOUNTER — Other Ambulatory Visit (HOSPITAL_COMMUNITY): Payer: Managed Care, Other (non HMO)

## 2018-04-17 ENCOUNTER — Emergency Department (HOSPITAL_COMMUNITY)
Admission: EM | Admit: 2018-04-17 | Discharge: 2018-04-17 | Disposition: A | Discharge status: Home / Self Care | Payer: Managed Care, Other (non HMO) | Attending: Emergency Medicine | Admitting: Emergency Medicine

## 2018-04-17 DIAGNOSIS — N938 Other specified abnormal uterine and vaginal bleeding: Secondary | ICD-10-CM | POA: Insufficient documentation

## 2018-04-17 DIAGNOSIS — J45909 Unspecified asthma, uncomplicated: Secondary | ICD-10-CM | POA: Insufficient documentation

## 2018-04-17 DIAGNOSIS — Z79899 Other long term (current) drug therapy: Secondary | ICD-10-CM | POA: Diagnosis not present

## 2018-04-17 DIAGNOSIS — A599 Trichomoniasis, unspecified: Secondary | ICD-10-CM | POA: Diagnosis not present

## 2018-04-17 DIAGNOSIS — R102 Pelvic and perineal pain: Secondary | ICD-10-CM | POA: Insufficient documentation

## 2018-04-17 DIAGNOSIS — N939 Abnormal uterine and vaginal bleeding, unspecified: Secondary | ICD-10-CM

## 2018-04-17 LAB — I-STAT BETA HCG BLOOD, ED (MC, WL, AP ONLY)

## 2018-04-17 LAB — URINALYSIS, ROUTINE W REFLEX MICROSCOPIC
BILIRUBIN URINE: NEGATIVE
Bacteria, UA: NONE SEEN
GLUCOSE, UA: NEGATIVE mg/dL
Ketones, ur: NEGATIVE mg/dL
LEUKOCYTES UA: NEGATIVE
Nitrite: NEGATIVE
PH: 7 (ref 5.0–8.0)
Protein, ur: NEGATIVE mg/dL
Specific Gravity, Urine: 1.02 (ref 1.005–1.030)

## 2018-04-17 LAB — BASIC METABOLIC PANEL
ANION GAP: 8 (ref 5–15)
BUN: 15 mg/dL (ref 6–20)
CHLORIDE: 106 mmol/L (ref 98–111)
CO2: 23 mmol/L (ref 22–32)
Calcium: 9 mg/dL (ref 8.9–10.3)
Creatinine, Ser: 0.84 mg/dL (ref 0.44–1.00)
GFR calc Af Amer: >60 mL/min (ref >60)
GLUCOSE: 94 mg/dL (ref 70–99)
POTASSIUM: 3.8 mmol/L (ref 3.5–5.1)
Sodium: 137 mmol/L (ref 135–145)

## 2018-04-17 LAB — CBC WITH DIFFERENTIAL/PLATELET
ABS IMMATURE GRANULOCYTES: 0 10*3/uL (ref 0.0–0.1)
BASOS PCT: 0 %
Basophils Absolute: 0 10*3/uL (ref 0.0–0.1)
Eosinophils Absolute: 0.1 10*3/uL (ref 0.0–0.7)
Eosinophils Relative: 1 %
HEMATOCRIT: 36.1 % (ref 36.0–46.0)
HEMOGLOBIN: 11.4 g/dL — AB (ref 12.0–15.0)
IMMATURE GRANULOCYTES: 0 %
Lymphocytes Relative: 32 %
Lymphs Abs: 1.7 10*3/uL (ref 0.7–4.0)
MCH: 29.2 pg (ref 26.0–34.0)
MCHC: 31.6 g/dL (ref 30.0–36.0)
MCV: 92.6 fL (ref 78.0–100.0)
Monocytes Absolute: 0.4 10*3/uL (ref 0.1–1.0)
Monocytes Relative: 7 %
Neutro Abs: 3.1 10*3/uL (ref 1.7–7.7)
Neutrophils Relative %: 60 %
Platelets: 255 10*3/uL (ref 150–400)
RBC: 3.9 MIL/uL (ref 3.87–5.11)
RDW: 15.4 % (ref 11.5–15.5)
WBC: 5.3 10*3/uL (ref 4.0–10.5)

## 2018-04-17 LAB — WET PREP, GENITAL
Sperm: NONE SEEN
Yeast Wet Prep HPF POC: NONE SEEN

## 2018-04-17 MED ORDER — METRONIDAZOLE 500 MG PO TABS
2000.0000 mg | ORAL_TABLET | Freq: Once | ORAL | Status: AC
  Administered 2018-04-17: 2000 mg via ORAL
  Filled 2018-04-17: qty 4

## 2018-04-17 MED ORDER — ALBUTEROL SULFATE HFA 108 (90 BASE) MCG/ACT IN AERS: 2.0000 | INHALATION_SPRAY | RESPIRATORY_TRACT | 0 refills | Status: DC | PRN

## 2018-04-17 MED ORDER — NAPROXEN 375 MG PO TABS: 375.0000 mg | ORAL_TABLET | Freq: Two times a day (BID) | ORAL | 0 refills | Status: DC

## 2018-04-17 MED ORDER — KETOROLAC TROMETHAMINE 30 MG/ML IJ SOLN
30.0000 mg | Freq: Once | INTRAMUSCULAR | Status: AC
  Administered 2018-04-17: 30 mg via INTRAVENOUS
  Filled 2018-04-17: qty 1

## 2018-04-17 NOTE — ED Provider Notes (Signed)
[facility] EMERGENCY DEPARTMENT Provider Note   CSN: [id number] Arrival date & time: 04/17/18  8657     History   Chief Complaint Chief Complaint  Patient presents with  . Miscarriage    HPI Jillian Mcgrath is a 37 y.o. female who presents for evaluation of bleeding and pelvic cramping.  Patient states she had onset of bleeding yesterday evening.  Today she is been at work since 5 AM and states that she is already bled through 6 pads.  She has no history of heavy vaginal bleeding.  She states that she is having severe cramping that feels like contractions that is what she thought she may be she was having a miscarriage.  Also she has not had a menstrual period since July.  She denies taking a home pregnancy test and has a negative pregnancy test here in the emergency department.  She denies lightheadedness, dizziness or shortness of breath.  She denies any possibility of trauma or lacerations.  Pain does not radiate.  She denies flank pain, fever or chills, nausea, vomiting, diarrhea, urinary symptoms.  HPI  Past Medical History:  Diagnosis Date  . Asthma   . History of kidney stones   . Kidney stone    x 14 lithrotripsies  . PONV (postoperative nausea and vomiting)     There are no active problems to display for this patient.   Past Surgical History:  Procedure Laterality Date  . CESAREAN SECTION     x 4  . CYSTOSCOPY/URETEROSCOPY/HOLMIUM LASER/STENT PLACEMENT Left 01/21/2018   Procedure: CYSTOSCOPY /LEFT URETEROSCOPY/HOLMIUM LASER/LEFT STENT PLACEMENT;  Surgeon: Crista Elliot, MD;  Location: [facility];  Service: Urology;  Laterality: Left;  . LITHOTRIPSY    . TONSILLECTOMY       OB History   None      Home Medications    Prior to Admission medications   Medication Sig Start Date End Date Taking? Authorizing Provider  albuterol (PROVENTIL HFA;VENTOLIN HFA) 108 (90 Base) MCG/ACT inhaler Inhale 2 puffs into the lungs every 4 (four) hours as  needed for wheezing or shortness of breath. 04/17/18   Keyonia Gluth, Cammy Copa, PA-C  naproxen (NAPROSYN) 375 MG tablet Take 1 tablet (375 mg total) by mouth 2 (two) times daily. 04/17/18   Arthor Captain, PA-C    Family History No family history on file.  Social History Social History   Tobacco Use  . Smoking status: Never Smoker  . Smokeless tobacco: Never Used  Substance Use Topics  . Alcohol use: Never    Frequency: Never  . Drug use: Never     Allergies   Demerol [meperidine] and Sulfa antibiotics   Review of Systems Review of Systems  Ten systems reviewed and are negative for acute change, except as noted in the HPI.   Physical Exam Updated Vital Signs BP (!) 113/57   Pulse 67   Temp 98.4 F (36.9 C) (Oral)   Resp 18   Ht 6' (1.829 m)   Wt 68.9 kg   SpO2 100%   BMI 20.61 kg/m   Physical Exam  Constitutional: She is oriented to person, place, and time. She appears well-developed and well-nourished. No distress.  HENT:  Head: Normocephalic and atraumatic.  Eyes: Conjunctivae are normal. No scleral icterus.  Neck: Normal range of motion.  Cardiovascular: Normal rate, regular rhythm and normal heart sounds. Exam reveals no gallop and no friction rub.  No murmur heard. Pulmonary/Chest: Effort normal and breath sounds normal. No respiratory distress.  Abdominal: Soft. Bowel sounds are normal. She exhibits no distension and no mass. There is no tenderness. There is no guarding.  Neurological: She is alert and oriented to person, place, and time.  Skin: Skin is warm and dry. She is not diaphoretic.  Psychiatric: Her behavior is normal.  Nursing note and vitals reviewed.    ED Treatments / Results  Labs (all labs ordered are listed, but only abnormal results are displayed) Labs Reviewed  WET PREP, GENITAL - Abnormal; Notable for the following components:      Result Value   Trich, Wet Prep PRESENT (*)    Clue Cells Wet Prep HPF POC PRESENT (*)    WBC, Wet Prep  HPF POC FEW (*)    All other components within normal limits  CBC WITH DIFFERENTIAL/PLATELET - Abnormal; Notable for the following components:   Hemoglobin 11.4 (*)    All other components within normal limits  URINALYSIS, ROUTINE W REFLEX MICROSCOPIC - Abnormal; Notable for the following components:   Hgb urine dipstick LARGE (*)    RBC / HPF >50 (*)    All other components within normal limits  BASIC METABOLIC PANEL  I-STAT BETA HCG BLOOD, ED ([facility], [facility], [facility] ONLY)  GC/CHLAMYDIA PROBE AMP ([place]) NOT AT [facility]    EKG None  Radiology No results found.  Procedures Procedures (including critical care time)  Medications Ordered in ED Medications  ketorolac (TORADOL) 30 MG/ML injection 30 mg (30 mg Intravenous Given 04/17/18 1131)  metroNIDAZOLE (FLAGYL) tablet 2,000 mg (2,000 mg Oral Given 04/17/18 1325)     Initial Impression / Assessment and Plan / ED Course  I have reviewed the triage vital signs and the nursing notes.  Pertinent labs & imaging results that were available during my care of the patient were reviewed by me and considered in my medical decision making (see chart for details).  Clinical Course as of Apr 18 1531  Wed Apr 17, 2018  2916 37 year old female with heavy vaginal bleeding.  Urinalysis is negative.  She is a benign pelvic examination, no CMT, no adnexal fullness or tenderness.  Her hemoglobin is slightly low but this appears to be her baseline.  She has no dizziness shortness of breath or other signs of symptomatic anemia.  Patient received Toradol and her cramping is significantly improved.  She is negative for pregnancy.  Her wet prep shows positive clue cells and the presence of trichomoniasis. I discussed the findings with the patient.  I discussed that she will need treatment here with 2 g of oral Flagyl, partner treatment and abstaining from sexual intercourse with that person until he or she is treated.  The patient declines to stay for further work-up  and ultrasound at this time.  I feel that that is okay.  Patient is advised to follow closely with OB/GYN and has been group and referral to the emergency department at [facility] should she have any worsening of her symptoms including bleeding through 1 pad every 1/2 hour, shortness of breath, dizziness, any worsening pain or other concerning changes.   [AH]  1532 States that she has a history of asthma asks for a prescription for albuterol because she ran out at home she is asymptomatic at this time.   [AH]    Clinical Course User Index [AH] Arthor Captain, PA-C     Final Clinical Impressions(s) / ED Diagnoses   Final diagnoses:  Vaginal bleeding  Trichimoniasis    ED Discharge Orders  Ordered    naproxen (NAPROSYN) 375 MG tablet  2 times daily,   Status:  Discontinued     04/17/18 1313    albuterol (PROVENTIL HFA;VENTOLIN HFA) 108 (90 Base) MCG/ACT inhaler  Every 4 hours PRN,   Status:  Discontinued     04/17/18 1324    albuterol (PROVENTIL HFA;VENTOLIN HFA) 108 (90 Base) MCG/ACT inhaler  Every 4 hours PRN     04/17/18 1325    naproxen (NAPROSYN) 375 MG tablet  2 times daily     04/17/18 1326           Arthor Captain, PA-C 04/17/18 1532    Charlynne Pander, MD 04/17/18 [phone]

## 2018-04-17 NOTE — ED Notes (Signed)
Pt stable, ambulatory, states understanding of discharge instructions 

## 2018-04-17 NOTE — ED Triage Notes (Signed)
Pt with severe abdominal cramping and bleeding. She reports that she may be having a miscarriage. Since this morning she has used 6 sanitary napkins.

## 2018-04-17 NOTE — Discharge Instructions (Addendum)
Contact a health care provider if: °Your bleeding lasts more than 1 week. °You feel dizzy at times. °Get help right away if: °You pass out. °You are changing pads every 15 to 30 minutes. °You have abdominal pain. °You have a fever. °You become sweaty or weak. °You are passing large blood clots from the vagina. °You start to feel nauseous and vomit. °

## 2018-04-18 ENCOUNTER — Emergency Department (HOSPITAL_COMMUNITY): Payer: Managed Care, Other (non HMO)

## 2018-04-18 ENCOUNTER — Emergency Department (HOSPITAL_COMMUNITY)
Admission: EM | Admit: 2018-04-18 | Discharge: 2018-04-18 | Disposition: A | Discharge status: Home / Self Care | Payer: Managed Care, Other (non HMO) | Attending: Emergency Medicine | Admitting: Emergency Medicine

## 2018-04-18 DIAGNOSIS — J45909 Unspecified asthma, uncomplicated: Secondary | ICD-10-CM | POA: Insufficient documentation

## 2018-04-18 DIAGNOSIS — Z79899 Other long term (current) drug therapy: Secondary | ICD-10-CM | POA: Diagnosis not present

## 2018-04-18 DIAGNOSIS — R109 Unspecified abdominal pain: Secondary | ICD-10-CM | POA: Insufficient documentation

## 2018-04-18 LAB — URINALYSIS, ROUTINE W REFLEX MICROSCOPIC
BILIRUBIN URINE: NEGATIVE
Glucose, UA: NEGATIVE mg/dL
Ketones, ur: NEGATIVE mg/dL
Nitrite: NEGATIVE
PH: 8 (ref 5.0–8.0)
Protein, ur: 30 mg/dL — AB
RBC / HPF: >50 RBC/hpf — ABNORMAL HIGH (ref 0–5)
SPECIFIC GRAVITY, URINE: 1.015 (ref 1.005–1.030)

## 2018-04-18 LAB — COMPREHENSIVE METABOLIC PANEL
ALT: 10 U/L (ref 0–44)
AST: 31 U/L (ref 15–41)
Albumin: 3.9 g/dL (ref 3.5–5.0)
Alkaline Phosphatase: 40 U/L (ref 38–126)
Anion gap: 7 (ref 5–15)
BUN: 13 mg/dL (ref 6–20)
CHLORIDE: 109 mmol/L (ref 98–111)
CO2: 23 mmol/L (ref 22–32)
CREATININE: 0.94 mg/dL (ref 0.44–1.00)
Calcium: 9 mg/dL (ref 8.9–10.3)
GFR calc non Af Amer: >60 mL/min (ref >60)
Glucose, Bld: 89 mg/dL (ref 70–99)
POTASSIUM: 4.6 mmol/L (ref 3.5–5.1)
SODIUM: 139 mmol/L (ref 135–145)
Total Bilirubin: 1.3 mg/dL — ABNORMAL HIGH (ref 0.3–1.2)
Total Protein: 6.9 g/dL (ref 6.5–8.1)

## 2018-04-18 LAB — CBC
HEMATOCRIT: 37 % (ref 36.0–46.0)
Hemoglobin: 12 g/dL (ref 12.0–15.0)
MCH: 29.4 pg (ref 26.0–34.0)
MCHC: 32.4 g/dL (ref 30.0–36.0)
MCV: 90.7 fL (ref 78.0–100.0)
PLATELETS: 300 10*3/uL (ref 150–400)
RBC: 4.08 MIL/uL (ref 3.87–5.11)
RDW: 15.7 % — ABNORMAL HIGH (ref 11.5–15.5)
WBC: 5.6 10*3/uL (ref 4.0–10.5)

## 2018-04-18 LAB — I-STAT BETA HCG BLOOD, ED (MC, WL, AP ONLY): I-stat hCG, quantitative: <5 m[IU]/mL (ref <5)

## 2018-04-18 LAB — GC/CHLAMYDIA PROBE AMP (~~LOC~~) NOT AT ARMC
Chlamydia: POSITIVE — AB
NEISSERIA GONORRHEA: NEGATIVE

## 2018-04-18 LAB — LIPASE, BLOOD: LIPASE: 43 U/L (ref 11–51)

## 2018-04-18 MED ORDER — CYCLOBENZAPRINE HCL 5 MG PO TABS: 5.0000 mg | ORAL_TABLET | Freq: Two times a day (BID) | ORAL | 0 refills | Status: DC | PRN

## 2018-04-18 MED ORDER — ACETAMINOPHEN 500 MG PO TABS
1000.0000 mg | ORAL_TABLET | Freq: Once | ORAL | Status: AC
  Administered 2018-04-18: 1000 mg via ORAL
  Filled 2018-04-18: qty 2

## 2018-04-18 MED ORDER — ONDANSETRON HCL 4 MG/2ML IJ SOLN
4.0000 mg | Freq: Once | INTRAMUSCULAR | Status: AC
  Administered 2018-04-18: 4 mg via INTRAVENOUS
  Filled 2018-04-18: qty 2

## 2018-04-18 MED ORDER — SODIUM CHLORIDE 0.9 % IV BOLUS
1000.0000 mL | Freq: Once | INTRAVENOUS | Status: AC
  Administered 2018-04-18: 1000 mL via INTRAVENOUS

## 2018-04-18 MED ORDER — KETOROLAC TROMETHAMINE 30 MG/ML IJ SOLN
30.0000 mg | Freq: Once | INTRAMUSCULAR | Status: DC
  Filled 2018-04-18: qty 1

## 2018-04-18 MED ORDER — OXYCODONE HCL 5 MG PO TABS
5.0000 mg | ORAL_TABLET | Freq: Once | ORAL | Status: AC
  Administered 2018-04-18: 5 mg via ORAL
  Filled 2018-04-18: qty 1

## 2018-04-18 NOTE — ED Notes (Signed)
Bed: WA08 Expected date:  Expected time:  Means of arrival:  Comments: EMS 37yo flank pain ?kidney stones

## 2018-04-18 NOTE — ED Triage Notes (Signed)
Transported by [facility] from work-- sudden onset of RLQ and right flank pain that started this morning. Hx of kidney stones, last stone occurrence was July. +hematuria & nausea.

## 2018-04-18 NOTE — ED Notes (Signed)
Patient refusing to go to [place] at this time, provider aware. Patient requesting "stronger" pain medication at this time.

## 2018-04-18 NOTE — ED Provider Notes (Signed)
[facility]-EMERGENCY DEPT Provider Note   CSN: [id number] Arrival date & time: 04/18/18  1006     History   Chief Complaint Chief Complaint  Patient presents with  . Flank Pain    HPI Jillian Mcgrath is a 37 y.o. female.  HPI   9AM developed pain, consistent with prior kidney stones. Left flank pain, radiates from flank to lower abdomen.  No fevers. Having hematuria, no dysuria.  Nausea, no vomiting. No constipation or diarrhea.  Pain is severe. Like prior nephrolithiasis.   Past Medical History:  Diagnosis Date  . Asthma   . History of kidney stones   . Kidney stone    x 14 lithrotripsies  . PONV (postoperative nausea and vomiting)     There are no active problems to display for this patient.   Past Surgical History:  Procedure Laterality Date  . CESAREAN SECTION     x 4  . CYSTOSCOPY/URETEROSCOPY/HOLMIUM LASER/STENT PLACEMENT Left 01/21/2018   Procedure: CYSTOSCOPY /LEFT URETEROSCOPY/HOLMIUM LASER/LEFT STENT PLACEMENT;  Surgeon: Crista Elliot, MD;  Location: [facility];  Service: Urology;  Laterality: Left;  . LITHOTRIPSY    . TONSILLECTOMY       OB History   None      Home Medications    Prior to Admission medications   Medication Sig Start Date End Date Taking? Authorizing Provider  albuterol (PROVENTIL HFA;VENTOLIN HFA) 108 (90 Base) MCG/ACT inhaler Inhale 2 puffs into the lungs every 4 (four) hours as needed for wheezing or shortness of breath. 04/17/18  Yes Harris, Abigail, PA-C  cyclobenzaprine (FLEXERIL) 5 MG tablet Take 1 tablet (5 mg total) by mouth 2 (two) times daily as needed for muscle spasms. 04/18/18   Alvira Monday, MD  naproxen (NAPROSYN) 375 MG tablet Take 1 tablet (375 mg total) by mouth 2 (two) times daily. 04/17/18   Arthor Captain, PA-C    Family History No family history on file.  Social History Social History   Tobacco Use  . Smoking status: Never Smoker  . Smokeless tobacco: Never Used  Substance Use  Topics  . Alcohol use: Never    Frequency: Never  . Drug use: Never     Allergies   Demerol [meperidine] and Sulfa antibiotics   Review of Systems Review of Systems  Constitutional: Negative for fever.  HENT: Negative for sore throat.   Eyes: Negative for visual disturbance.  Respiratory: Negative for cough and shortness of breath.   Cardiovascular: Negative for chest pain.  Gastrointestinal: Positive for nausea. Negative for abdominal pain and vomiting.  Genitourinary: Positive for hematuria and vaginal bleeding (menses). Negative for difficulty urinating.  Musculoskeletal: Negative for back pain and neck pain.  Skin: Negative for rash.  Neurological: Negative for syncope and headaches.     Physical Exam Updated Vital Signs BP 108/61   Pulse 65   Temp 97.9 F (36.6 C)   Resp 16   SpO2 100%   Physical Exam  Constitutional: She is oriented to person, place, and time. She appears well-developed and well-nourished. She appears distressed (pain).  HENT:  Head: Normocephalic and atraumatic.  Eyes: Conjunctivae and EOM are normal.  Neck: Normal range of motion.  Cardiovascular: Normal rate, regular rhythm, normal heart sounds and intact distal pulses. Exam reveals no gallop and no friction rub.  No murmur heard. Pulmonary/Chest: Effort normal and breath sounds normal. No respiratory distress. She has no wheezes. She has no rales.  Abdominal: Soft. She exhibits no distension. There is no  tenderness. There is CVA tenderness (left). There is no guarding.  Musculoskeletal: She exhibits no edema or tenderness.  Neurological: She is alert and oriented to person, place, and time.  Skin: Skin is warm and dry. No rash noted. She is not diaphoretic. No erythema.  Nursing note and vitals reviewed.    ED Treatments / Results  Labs (all labs ordered are listed, but only abnormal results are displayed) Labs Reviewed  COMPREHENSIVE METABOLIC PANEL - Abnormal; Notable for the  following components:      Result Value   Total Bilirubin 1.3 (*)    All other components within normal limits  CBC - Abnormal; Notable for the following components:   RDW 15.7 (*)    All other components within normal limits  URINALYSIS, ROUTINE W REFLEX MICROSCOPIC - Abnormal; Notable for the following components:   Hgb urine dipstick LARGE (*)    Protein, ur 30 (*)    Leukocytes, UA TRACE (*)    RBC / HPF >50 (*)    Bacteria, UA RARE (*)    All other components within normal limits  LIPASE, BLOOD  I-STAT BETA HCG BLOOD, ED ([facility], [facility], [facility] ONLY)    EKG None  Radiology Ct Renal Stone Study  Result Date: 04/18/2018 CLINICAL DATA:  37 year old female with acute RIGHT abdominal, pelvic and flank pain today. EXAM: CT ABDOMEN AND PELVIS WITHOUT CONTRAST TECHNIQUE: Multidetector CT imaging of the abdomen and pelvis was performed following the standard protocol without IV contrast. COMPARISON:  02/28/2018 and prior CTs FINDINGS: Please note that parenchymal abnormalities may be missed without intravenous contrast. Lower chest: No acute abnormality. Hepatobiliary: The liver and gallbladder are unremarkable. No biliary dilatation. Pancreas: Unremarkable Spleen: Unremarkable Adrenals/Urinary Tract: Multiple nonobstructing bilateral renal calculi identified with increased densities throughout the medullary regions bilaterally. The largest calculus measures 7 mm within the UPPER RIGHT kidney. There is no evidence of hydronephrosis or obstructing urinary calculus. Mild fullness of the RIGHT intrarenal collecting system is again noted. The adrenal glands and bladder are unremarkable. Stomach/Bowel: Stomach is within normal limits. No evidence of bowel wall thickening, distention, or inflammatory changes. The appendix is not identified but no findings suspicious for appendicitis noted. Vascular/Lymphatic: No significant vascular findings are present. No enlarged abdominal or pelvic lymph nodes. Reproductive:  Uterus and bilateral adnexa are unremarkable. Other: No ascites, pneumoperitoneum or abdominal wall hernia. Musculoskeletal: No acute or suspicious bony abnormalities. Mild degenerative disc disease and mild-moderate broad-based disc bulge at L3-4 again noted. IMPRESSION: 1. No evidence of acute abnormality 2. 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 3. Mild degenerative disc disease with mild to moderate broad-based disc bulge at L3-4. Electronically Signed   By: Harmon Pier M.D.   On: 04/18/2018 12:43    Procedures Procedures (including critical care time)  Medications Ordered in ED Medications  sodium chloride 0.9 % bolus 1,000 mL (0 mLs Intravenous Stopped 04/18/18 1344)  oxyCODONE (Oxy IR/ROXICODONE) immediate release tablet 5 mg (5 mg Oral Given 04/18/18 1207)  acetaminophen (TYLENOL) tablet 1,000 mg (1,000 mg Oral Given 04/18/18 1207)  ondansetron (ZOFRAN) injection 4 mg (4 mg Intravenous Given 04/18/18 1317)     Initial Impression / Assessment and Plan / ED Course  I have reviewed the triage vital signs and the nursing notes.  Pertinent labs & imaging results that were available during my care of the patient were reviewed by me and considered in my medical decision making (see chart for details).     37yo female with  history above including nephrolithiasis presents with concern for severe left flank pain.  Hx, exam concerning for nephrolithiasis. Initially ordered toradol, [place] and XR however patient refuses this testing.  She requests CT. I had long discussion with her regarding my recommendation for [place] over CT given risk of radiation, however she continues to request CT with understanding of radiation. She also refuses toradol. Given single dose of oral narcotic medication, given IV fluids, CT ordered showing nonobstructive renal calculi and sponge kidney.  Called urology regarding sponge kidney, appropriate for outpatient follow up. Patient  discharged in stable condition with understanding of reasons to return.   Final Clinical Impressions(s) / ED Diagnoses   Final diagnoses:  Left flank pain    ED Discharge Orders         Ordered    cyclobenzaprine (FLEXERIL) 5 MG tablet  2 times daily PRN     04/18/18 1337           Alvira Monday, MD 04/18/18 2129

## 2018-04-21 ENCOUNTER — Other Ambulatory Visit: Payer: Self-pay

## 2018-04-21 ENCOUNTER — Encounter (HOSPITAL_COMMUNITY): Payer: Self-pay | Admitting: Emergency Medicine

## 2018-04-21 ENCOUNTER — Emergency Department (HOSPITAL_COMMUNITY)
Admission: EM | Admit: 2018-04-21 | Discharge: 2018-04-21 | Disposition: A | Discharge status: Home / Self Care | Payer: 59 | Attending: Emergency Medicine | Admitting: Emergency Medicine

## 2018-04-21 DIAGNOSIS — J45909 Unspecified asthma, uncomplicated: Secondary | ICD-10-CM | POA: Diagnosis not present

## 2018-04-21 DIAGNOSIS — M5441 Lumbago with sciatica, right side: Secondary | ICD-10-CM

## 2018-04-21 DIAGNOSIS — M549 Dorsalgia, unspecified: Secondary | ICD-10-CM | POA: Diagnosis present

## 2018-04-21 DIAGNOSIS — Z79899 Other long term (current) drug therapy: Secondary | ICD-10-CM | POA: Diagnosis not present

## 2018-04-21 DIAGNOSIS — R109 Unspecified abdominal pain: Secondary | ICD-10-CM

## 2018-04-21 MED ORDER — DEXAMETHASONE SODIUM PHOSPHATE 10 MG/ML IJ SOLN
10.0000 mg | Freq: Once | INTRAMUSCULAR | Status: AC
  Administered 2018-04-21: 10 mg via INTRAMUSCULAR
  Filled 2018-04-21: qty 1

## 2018-04-21 MED ORDER — PREDNISONE 10 MG (21) PO TBPK: ORAL_TABLET | Freq: Every day | ORAL | 0 refills | Status: DC

## 2018-04-21 NOTE — Discharge Instructions (Addendum)
Please call and schedule a follow-up appointment regarding your spongy kidney and your kidney stones with Dr. Ronne Binning at [facility] urology.  If your back pain does not start to improve, you can call and follow up with [facility].  For your back pain, starting tomorrow, take 6 tabs by mouth daily  for 2 days, then 5 tabs for 2 days, then 4 tabs for 2 days, then 3 tabs for 2 days, 2 tabs for 2 days, then 1 tab by mouth daily for 2 days  Pain control you can alternate Aleve with Tylenol.  You can take thousand milligrams of Tylenol every 8 hours for pain control.  Do not take this medication while you are drinking alcohol.  You can also try over-the-counter lidocaine patches and apply them to the low back or try sleeping with a pillow between your legs.  Melatonin is available over-the-counter for sleep.  Take as directed on the label.  Tums or Zantac are also available over-the-counter for heartburn.  Take as directed on the label.

## 2018-04-21 NOTE — ED Triage Notes (Signed)
Pt presents with lower back pain with numbness continued since visit 2 days ago where she was prescribed flexeril and naproxen but she reports no improvement in pain; pt also states she has a 10mm stone in L kidney

## 2018-04-21 NOTE — ED Provider Notes (Signed)
[facility] EMERGENCY DEPARTMENT Provider Note   CSN: [id number] Arrival date & time: 04/21/18  1811     History   Chief Complaint Chief Complaint  Patient presents with  . Numbness  . Back Pain    HPI Jillian Mcgrath is a 37 y.o. female with a history of nephrolithiasis x14 lithotripsies and asthma who presents to the emergency department with a chief complaint of back pain and flank pain.  The patient endorses constant, worsening right flank pain and low back pain that radiates down the right leg.  She also reports that she has been having worsening numbness with the pain that is radiating down the right leg.  Reports that she was seen and evaluated in the emergency department on 04/18/2018 and was told that she has a 7 mm stone in her left kidney.  She reports she is also had worsening gross hematuria over the last few days, but denies abdominal pain, emesis, diarrhea, constipation, melena, hematochezia.  She reports that her significant other told her today that she "felt really hot and feverish", but she did not check her temperature at home.  She denies chills, chest pain, dyspnea, rash, or weakness.  She reports during her ED visit on 04/18/2018 that she was told that she has degenerative disc disease of her low back and that she needs a light duty note to return to work.  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  She states that she has been getting more lightheaded with bending over over the last few days.  She states that she was seen in the emergency department last month after she bent over and had a syncopal episode.  She required stitches above her left eyebrow at that time.  She reports that she has not followed up with urology since her last visit.  She is also call to get established with her primary care provider.  She has been taking the Flexeril and Aleve that she was discharged with  after her last visit without improvement.    The history is provided by the patient. No language interpreter was used.    Past Medical History:  Diagnosis Date  . Asthma   . History of kidney stones   . Kidney stone    x 14 lithrotripsies  . PONV (postoperative nausea and vomiting)     There are no active problems to display for this patient.   Past Surgical History:  Procedure Laterality Date  . CESAREAN SECTION     x 4  . CYSTOSCOPY/URETEROSCOPY/HOLMIUM LASER/STENT PLACEMENT Left 01/21/2018   Procedure: CYSTOSCOPY /LEFT URETEROSCOPY/HOLMIUM LASER/LEFT STENT PLACEMENT;  Surgeon: Crista Elliot, MD;  Location: [facility];  Service: Urology;  Laterality: Left;  . LITHOTRIPSY    . TONSILLECTOMY       OB History   None      Home Medications    Prior to Admission medications   Medication Sig Start Date End Date Taking? Authorizing Provider  albuterol (PROVENTIL HFA;VENTOLIN HFA) 108 (90 Base) MCG/ACT inhaler Inhale 2 puffs into the lungs every 4 (four) hours as needed for wheezing or shortness of breath. 04/17/18   Harris, Cammy Copa, PA-C  cyclobenzaprine (FLEXERIL) 5 MG tablet Take 1 tablet (5 mg total) by mouth 2 (two) times daily as needed for muscle spasms. 04/18/18   Alvira Monday, MD  naproxen (NAPROSYN) 375 MG tablet Take 1 tablet (375 mg total)  by mouth 2 (two) times daily. 04/17/18   Harris, Abigail, PA-C  predniSONE (STERAPRED UNI-PAK 21 TAB) 10 MG (21) TBPK tablet Take by mouth daily. Take 6 tabs by mouth daily  for 2 days, then 5 tabs for 2 days, then 4 tabs for 2 days, then 3 tabs for 2 days, 2 tabs for 2 days, then 1 tab by mouth daily for 2 days 04/21/18   Barkley Boards, PA-C    Family History History reviewed. No pertinent family history.  Social History Social History   Tobacco Use  . Smoking status: Never Smoker  . Smokeless tobacco: Never Used  Substance Use Topics  . Alcohol use: Never    Frequency: Never  . Drug use: Never     Allergies     Demerol [meperidine] and Sulfa antibiotics   Review of Systems Review of Systems  Constitutional: Positive for fever. Negative for activity change and chills.  Respiratory: Negative for shortness of breath.   Cardiovascular: Negative for chest pain.  Gastrointestinal: Positive for nausea. Negative for abdominal pain, anal bleeding, blood in stool, constipation, diarrhea and vomiting.  Genitourinary: Positive for flank pain and hematuria. Negative for dysuria, frequency, urgency, vaginal bleeding, vaginal discharge and vaginal pain.  Musculoskeletal: Negative for arthralgias, back pain, myalgias, neck pain and neck stiffness.  Skin: Negative for rash.  Allergic/Immunologic: Negative for immunocompromised state.  Neurological: Positive for numbness. Negative for weakness and headaches.  Psychiatric/Behavioral: Negative for confusion.   Physical Exam Updated Vital Signs BP 119/74   Pulse 75   Temp 97.9 F (36.6 C) (Rectal)   Resp 16   Ht 6' (1.829 m)   Wt 68.9 kg   SpO2 100%   BMI 20.60 kg/m   Physical Exam  Constitutional: No distress.  HENT:  Head: Normocephalic.  Eyes: Conjunctivae are normal.  Neck: Neck supple.  Cardiovascular: Normal rate and regular rhythm. Exam reveals no gallop and no friction rub.  No murmur heard. Pulmonary/Chest: Effort normal. No stridor. No respiratory distress. She has no wheezes. She has no rales. She exhibits no tenderness.  Abdominal: Soft. She exhibits no distension and no mass. There is no tenderness. There is no rebound and no guarding. No hernia.  Abdomen is soft, nontender, nondistended.  Significantly tender to palpation over the right CVA area.  No left CVA tenderness.  No rebound or guarding.  Musculoskeletal: She exhibits tenderness. She exhibits no edema or deformity.  Decreased sensation to the left lower extremity as compared to the right.  No tenderness to the left ankle or knee.  Tender to palpation over the lateral aspect of  the left hip that extends down the iliotibial band.  Increased pain with passive range of motion of the right hip.  Antalgic gait.  Patient is able to bear weight on the bilateral lower extremities.  DP pulses are 2+ and symmetric.  No tenderness palpation to the spinous processes of the cervical or thoracic spine.  She has mild tenderness to the spinous processes of the lumbar spine with mild bilateral tenderness to palpation.  Neurological: She is alert.  Skin: Skin is warm. No rash noted.  Psychiatric: Her behavior is normal.  Nursing note and vitals reviewed.    ED Treatments / Results  Labs (all labs ordered are listed, but only abnormal results are displayed) Labs Reviewed - No data to display  EKG None  Radiology No results found.  Procedures Procedures (including critical care time)  Medications Ordered in ED Medications  dexamethasone (  DECADRON) injection 10 mg (10 mg Intramuscular Given 04/21/18 2057)     Initial Impression / Assessment and Plan / ED Course  I have reviewed the triage vital signs and the nursing notes.  Pertinent labs & imaging results that were available during my care of the patient were reviewed by me and considered in my medical decision making (see chart for details).     37 year old female with a history of nephrolithiasis x14 lithotripsies and asthma presenting with right flank pain, right-sided low back pain, and pain that runs down the right leg.  She states that she was told after her last ED visit on 04/18/2018 that she had degenerative disc disease in her low back.  She is requesting a work note for light duty over the last few days.  However, when asked to point to her pain, she points to her right flank and right CVA region.  She also reports worsening hematuria over the last few days.  Her significant other is concerned that she felt feverish earlier today.  Patient's medical record was reviewed.  She is well-known to this ED with 10  visits in the last 6 months.  She has previously been given referrals to primary care and urology, but she has not followed up.  On her exam today, she has right CVA tenderness, and tenderness to the lumbar spine, but no crepitus or step-offs.  She does have a limping, antalgic gait, but is ambulated to and from the bathroom several times since arrival in the ED.  Although her significant other appears to be concerned for fever symptoms earlier today, the patient's primary concern with return to the emergency department was pain control and a work note for light duty.  Reviewed CT abdomen pelvis from 04/18/2018 which demonstrated mild degenerative disc disease with mild to moderate broad-based disc bulge at L3-L4.  He also has a calculus measuring 7 mm within the right upper kidney and mild fullness of the right intrarenal collecting system from spongy kidney.  Of note, since August the patient has had 2 CT renal stone studies and 2 renal ultrasounds.   Shared decision making conversation.  If she is febrile and is having worsening hematuria, her flank pain would be more consistent with an obstructing stone.  She has had no new falls or injuries since her most recent visit on 04/18/2018, but feels Flexeril and Aleve are not helping with her low back pain.  I have a low suspicion that she has an obstructing stone at this time.  She also had no focal neurologic deficits on her lumbar spine exam to warrant MRI.  After discussing at length options within the ED, the patient declines a repeat renal ultrasound.  However, I discussed that if she had a fever of your rectal temp she should consider having a renal ultrasound performed to ensure that she did not have a new obstructing stone, different from 3 days ago.  Afebrile with rectal temp.  I have again encouraged the patient length to follow-up with urology in the clinic.  She was given [facility]'s contact information additionally.  Will treat worsening back  pain with prednisone pack in addition to muscle relaxers and conservative pain management.  I would not feel comfortable giving the patient anything stronger for pain she had a recent ED visit on 03/24/2018 after having a vasovagal syncopal episode with EtOH on board.  Recommended following up with PCP or with neurosurgery if symptoms do not improve.  I have provided  her with a work note for light duty for 3 days, but have explained at length that if she needs a work note for a longer period of time that she needs to be reevaluated by primary care for this.  Low suspicion for urosepsis, cauda equina at this time.  Strict return precautions given.  She is hemodynamically stable and in no acute distress.  She is safe for discharge to home with outpatient follow-up at this time.  Final Clinical Impressions(s) / ED Diagnoses   Final diagnoses:  Right flank pain  Acute right-sided low back pain with right-sided sciatica    ED Discharge Orders         Ordered    predniSONE (STERAPRED UNI-PAK 21 TAB) 10 MG (21) TBPK tablet  Daily     04/21/18 2055           Frederik Pear A, PA-C 04/21/18 2335    Wynetta Fines, MD 04/24/18 [phone]

## 2018-04-21 NOTE — ED Notes (Signed)
Patient verbalizes understanding of discharge instructions. Opportunity for questioning and answers were provided. Armband removed by staff, pt discharged from ED.  

## 2018-04-23 ENCOUNTER — Emergency Department (HOSPITAL_COMMUNITY)
Admission: EM | Admit: 2018-04-23 | Discharge: 2018-04-23 | Disposition: A | Discharge status: Home / Self Care | Payer: 59 | Attending: Emergency Medicine | Admitting: Emergency Medicine

## 2018-04-23 ENCOUNTER — Encounter (HOSPITAL_COMMUNITY): Payer: Self-pay | Admitting: *Deleted

## 2018-04-23 ENCOUNTER — Other Ambulatory Visit: Payer: Self-pay

## 2018-04-23 ENCOUNTER — Emergency Department (HOSPITAL_COMMUNITY): Payer: 59

## 2018-04-23 DIAGNOSIS — R109 Unspecified abdominal pain: Secondary | ICD-10-CM | POA: Diagnosis not present

## 2018-04-23 DIAGNOSIS — J45909 Unspecified asthma, uncomplicated: Secondary | ICD-10-CM | POA: Diagnosis not present

## 2018-04-23 DIAGNOSIS — Z79899 Other long term (current) drug therapy: Secondary | ICD-10-CM | POA: Diagnosis not present

## 2018-04-23 LAB — RAPID URINE DRUG SCREEN, HOSP PERFORMED
Amphetamines: NOT DETECTED
BARBITURATES: NOT DETECTED
Benzodiazepines: NOT DETECTED
COCAINE: NOT DETECTED
Opiates: NOT DETECTED
Tetrahydrocannabinol: POSITIVE — AB

## 2018-04-23 LAB — CBC
HCT: 36.6 % (ref 36.0–46.0)
Hemoglobin: 11.2 g/dL — ABNORMAL LOW (ref 12.0–15.0)
MCH: 29.5 pg (ref 26.0–34.0)
MCHC: 30.6 g/dL (ref 30.0–36.0)
MCV: 96.3 fL (ref 80.0–100.0)
PLATELETS: 233 10*3/uL (ref 150–400)
RBC: 3.8 MIL/uL — ABNORMAL LOW (ref 3.87–5.11)
RDW: 16.5 % — AB (ref 11.5–15.5)
WBC: 8.6 10*3/uL (ref 4.0–10.5)
nRBC: 0 % (ref 0.0–0.2)

## 2018-04-23 LAB — BASIC METABOLIC PANEL
Anion gap: 11 (ref 5–15)
BUN: 13 mg/dL (ref 6–20)
CALCIUM: 8.4 mg/dL — AB (ref 8.9–10.3)
CO2: 18 mmol/L — ABNORMAL LOW (ref 22–32)
CREATININE: 0.78 mg/dL (ref 0.44–1.00)
Chloride: 109 mmol/L (ref 98–111)
GFR calc Af Amer: >60 mL/min (ref >60)
GFR calc non Af Amer: >60 mL/min (ref >60)
GLUCOSE: 79 mg/dL (ref 70–99)
Potassium: 3.8 mmol/L (ref 3.5–5.1)
SODIUM: 138 mmol/L (ref 135–145)

## 2018-04-23 LAB — URINALYSIS, ROUTINE W REFLEX MICROSCOPIC
BILIRUBIN URINE: NEGATIVE
Glucose, UA: NEGATIVE mg/dL
HGB URINE DIPSTICK: NEGATIVE
Ketones, ur: NEGATIVE mg/dL
Leukocytes, UA: NEGATIVE
NITRITE: NEGATIVE
PH: 7 (ref 5.0–8.0)
Protein, ur: NEGATIVE mg/dL
Specific Gravity, Urine: 1.015 (ref 1.005–1.030)

## 2018-04-23 MED ORDER — ONDANSETRON 8 MG PO TBDP: 8.0000 mg | ORAL_TABLET | Freq: Three times a day (TID) | ORAL | 0 refills | Status: DC | PRN

## 2018-04-23 MED ORDER — SODIUM CHLORIDE 0.9 % IV BOLUS
1000.0000 mL | Freq: Once | INTRAVENOUS | Status: AC
  Administered 2018-04-23: 1000 mL via INTRAVENOUS

## 2018-04-23 MED ORDER — OXYCODONE-ACETAMINOPHEN 5-325 MG PO TABS
2.0000 | ORAL_TABLET | Freq: Once | ORAL | Status: AC
  Administered 2018-04-23: 2 via ORAL
  Filled 2018-04-23: qty 2

## 2018-04-23 MED ORDER — HYDROMORPHONE HCL 1 MG/ML IJ SOLN
1.0000 mg | Freq: Once | INTRAMUSCULAR | Status: AC
  Administered 2018-04-23: 1 mg via INTRAVENOUS
  Filled 2018-04-23: qty 1

## 2018-04-23 MED ORDER — ONDANSETRON HCL 4 MG/2ML IJ SOLN
4.0000 mg | Freq: Once | INTRAMUSCULAR | Status: AC
  Administered 2018-04-23: 4 mg via INTRAVENOUS
  Filled 2018-04-23: qty 2

## 2018-04-23 MED ORDER — PHENAZOPYRIDINE HCL 200 MG PO TABS: 200.0000 mg | ORAL_TABLET | Freq: Three times a day (TID) | ORAL | 0 refills | Status: DC

## 2018-04-23 NOTE — ED Provider Notes (Addendum)
[facility] EMERGENCY DEPARTMENT Provider Note   CSN: [id number] Arrival date & time: 04/23/18  8657     History   Chief Complaint Chief Complaint  Patient presents with  . Back Pain  . Emesis    HPI Jillian Mcgrath is a 37 y.o. female.  HPI 37 year old female with a history of recurrent flank pain presents the emergency department with right flank pain as well as nausea vomiting over the past 24 hours.  She reports subjective fevers at home without documented fever.  She has a history of recurrent nephrolithiasis.  She is required lithotripsy before in the past.  She believes this is secondary to a 7 mm kidney stone.  The majority of her pain is right flank in origin.  She reports some pain in her right abdomen.  She was seen on 6 October in the emergency department and evaluated.  Sent home with steroid taper for possible right sided flank and back pain thought to be more musculoskeletal in nature.  Patient reports some urinary frequency without dysuria.  It is severe in severity.     Past Medical History:  Diagnosis Date  . Asthma   . History of kidney stones   . Kidney stone    x 14 lithrotripsies  . PONV (postoperative nausea and vomiting)     There are no active problems to display for this patient.   Past Surgical History:  Procedure Laterality Date  . CESAREAN SECTION     x 4  . CYSTOSCOPY/URETEROSCOPY/HOLMIUM LASER/STENT PLACEMENT Left 01/21/2018   Procedure: CYSTOSCOPY /LEFT URETEROSCOPY/HOLMIUM LASER/LEFT STENT PLACEMENT;  Surgeon: Crista Elliot, MD;  Location: [facility];  Service: Urology;  Laterality: Left;  . LITHOTRIPSY    . TONSILLECTOMY       OB History   None      Home Medications    Prior to Admission medications   Medication Sig Start Date End Date Taking? Authorizing Provider  albuterol (PROVENTIL HFA;VENTOLIN HFA) 108 (90 Base) MCG/ACT inhaler Inhale 2 puffs into the lungs every 4 (four) hours as needed for wheezing or  shortness of breath. 04/17/18  Yes Harris, Abigail, PA-C  cyclobenzaprine (FLEXERIL) 5 MG tablet Take 1 tablet (5 mg total) by mouth 2 (two) times daily as needed for muscle spasms. 04/18/18  Yes Alvira Monday, MD  naproxen (NAPROSYN) 375 MG tablet Take 1 tablet (375 mg total) by mouth 2 (two) times daily. 04/17/18  Yes Harris, Abigail, PA-C  ondansetron (ZOFRAN ODT) 8 MG disintegrating tablet Take 1 tablet (8 mg total) by mouth every 8 (eight) hours as needed for nausea or vomiting. 04/23/18   Azalia Bilis, MD  phenazopyridine (PYRIDIUM) 200 MG tablet Take 1 tablet (200 mg total) by mouth 3 (three) times daily. 04/23/18   Azalia Bilis, MD  predniSONE (STERAPRED UNI-PAK 21 TAB) 10 MG (21) TBPK tablet Take by mouth daily. Take 6 tabs by mouth daily  for 2 days, then 5 tabs for 2 days, then 4 tabs for 2 days, then 3 tabs for 2 days, 2 tabs for 2 days, then 1 tab by mouth daily for 2 days 04/21/18   Frederik Pear A, PA-C    Family History No family history on file.  Social History Social History   Tobacco Use  . Smoking status: Never Smoker  . Smokeless tobacco: Never Used  Substance Use Topics  . Alcohol use: Never    Frequency: Never  . Drug use: Never     Allergies  Demerol [meperidine] and Sulfa antibiotics   Review of Systems Review of Systems  All other systems reviewed and are negative.    Physical Exam Updated Vital Signs BP (!) 104/56   Pulse (!) 58   Temp 99.8 F (37.7 C) (Oral)   Resp 20   Ht 6' (1.829 m)   Wt 68 kg   LMP 04/20/2018 (Exact Date)   SpO2 100%   BMI 20.33 kg/m   Physical Exam  Constitutional: She is oriented to person, place, and time. She appears well-developed and well-nourished. No distress.  HENT:  Head: Normocephalic and atraumatic.  Eyes: EOM are normal.  Neck: Normal range of motion.  Cardiovascular: Normal rate, regular rhythm and normal heart sounds.  Pulmonary/Chest: Effort normal and breath sounds normal.  Abdominal: Soft. She  exhibits no distension. There is no tenderness.  Musculoskeletal: Normal range of motion.  Neurological: She is alert and oriented to person, place, and time.  Skin: Skin is warm and dry.  Psychiatric: She has a normal mood and affect. Judgment normal.  Nursing note and vitals reviewed.    ED Treatments / Results  Labs (all labs ordered are listed, but only abnormal results are displayed) Labs Reviewed  CBC - Abnormal; Notable for the following components:      Result Value   RBC 3.80 (*)    Hemoglobin 11.2 (*)    RDW 16.5 (*)    All other components within normal limits  BASIC METABOLIC PANEL - Abnormal; Notable for the following components:   CO2 18 (*)    Calcium 8.4 (*)    All other components within normal limits  URINALYSIS, ROUTINE W REFLEX MICROSCOPIC - Abnormal; Notable for the following components:   APPearance CLOUDY (*)    All other components within normal limits  RAPID URINE DRUG SCREEN, HOSP PERFORMED - Abnormal; Notable for the following components:   Tetrahydrocannabinol POSITIVE (*)    All other components within normal limits  URINE CULTURE    EKG None  Radiology US Renal  Result Date: 04/23/2018 CLINICAL DATA:  Flank pain EXAM: RENAL / URINARY TRACT ULTRASOUND COMPLETE COMPARISON:  04/18/2018 CT of the abdomen and pelvis FINDINGS: Right Kidney: Length: 11.2 cm. 9 mm echogenicity is noted in the upper pole consistent with the known renal stone seen on prior CT examination. No findings to suggest hydronephrosis are noted. Additionally some echogenic changes are noted in the medulla consistent with medullary sponge kidney similar to that seen on prior CT examination. Left Kidney: Length: 11.9 cm. Calculi are noted on the left the largest of which measures 4 mm in the lower pole. No hydronephrosis is seen. Bladder: Appears normal for degree of bladder distention. IMPRESSION: Bilateral renal calculi without hydronephrotic change. This is similar to that seen on  prior CT. Electronically Signed   By: Alcide Clever M.D.   On: 04/23/2018 11:15    Procedures Procedures (including critical care time)  Medications Ordered in ED Medications  HYDROmorphone (DILAUDID) injection 1 mg (1 mg Intravenous Given 04/23/18 0836)  ondansetron (ZOFRAN) injection 4 mg (4 mg Intravenous Given 04/23/18 0836)  sodium chloride 0.9 % bolus 1,000 mL (0 mLs Intravenous Stopped 04/23/18 1011)  oxyCODONE-acetaminophen (PERCOCET/ROXICET) 5-325 MG per tablet 2 tablet (2 tablets Oral Given 04/23/18 1133)     Initial Impression / Assessment and Plan / ED Course  I have reviewed the triage vital signs and the nursing notes.  Pertinent labs & imaging results that were available during my care of the  patient were reviewed by me and considered in my medical decision making (see chart for details).     Renal ultrasound is reassuring without evidence of hydronephrosis.  No documented temperature in the emergency department.  Feels better in the emergency department.  This seems to be more of a recurrent/chronic issue with recurrent flank pain unrelieved by prior stenting by urology.  She may benefit from subspecialty follow-up with GI and gynecology.  This could represent endometriosis.  I am not sure urology has more to offer.  She has scheduled urology follow-up in they will reassess.  Patient is overall well-appearing and stable for discharge from the emergency department without additional work-up at this time.  She is encouraged to return to the ER for new or worsening symptoms   Final Clinical Impressions(s) / ED Diagnoses   Final diagnoses:  Right flank pain    ED Discharge Orders         Ordered    ondansetron (ZOFRAN ODT) 8 MG disintegrating tablet  Every 8 hours PRN     04/23/18 1140    phenazopyridine (PYRIDIUM) 200 MG tablet  3 times daily     04/23/18 1140           Azalia Bilis, MD 04/23/18 1152    Azalia Bilis, MD 04/30/18 [phone]

## 2018-04-23 NOTE — Discharge Instructions (Addendum)
Please follow up with a gynecologist.

## 2018-04-23 NOTE — ED Triage Notes (Signed)
States she has a kidney stone c/o back pain and vomiting.

## 2018-04-24 ENCOUNTER — Other Ambulatory Visit: Payer: Self-pay | Admitting: Obstetrics and Gynecology

## 2018-04-24 LAB — URINE CULTURE

## 2018-04-24 MED ORDER — AZITHROMYCIN 250 MG PO TABS: 1000.0000 mg | ORAL_TABLET | Freq: Once | ORAL | 0 refills | Status: AC

## 2018-04-24 NOTE — Progress Notes (Signed)
+   chlamydia  RX for Azithromycin sent to pharmacy.    Venia Carbon I, NP 04/24/2018 1:08 PM

## 2018-06-03 ENCOUNTER — Other Ambulatory Visit: Payer: Self-pay

## 2018-06-03 ENCOUNTER — Emergency Department (HOSPITAL_COMMUNITY): Payer: 59

## 2018-06-03 ENCOUNTER — Emergency Department (HOSPITAL_COMMUNITY)
Admission: EM | Admit: 2018-06-03 | Discharge: 2018-06-03 | Discharge status: Against Medical Advice | Payer: 59 | Attending: Emergency Medicine | Admitting: Emergency Medicine

## 2018-06-03 ENCOUNTER — Encounter (HOSPITAL_COMMUNITY): Payer: Self-pay | Admitting: *Deleted

## 2018-06-03 DIAGNOSIS — R319 Hematuria, unspecified: Secondary | ICD-10-CM | POA: Insufficient documentation

## 2018-06-03 DIAGNOSIS — R110 Nausea: Secondary | ICD-10-CM | POA: Diagnosis not present

## 2018-06-03 DIAGNOSIS — R1030 Lower abdominal pain, unspecified: Secondary | ICD-10-CM | POA: Insufficient documentation

## 2018-06-03 DIAGNOSIS — Z87442 Personal history of urinary calculi: Secondary | ICD-10-CM | POA: Diagnosis not present

## 2018-06-03 DIAGNOSIS — Z5329 Procedure and treatment not carried out because of patient's decision for other reasons: Secondary | ICD-10-CM | POA: Insufficient documentation

## 2018-06-03 DIAGNOSIS — R109 Unspecified abdominal pain: Secondary | ICD-10-CM

## 2018-06-03 DIAGNOSIS — Z79899 Other long term (current) drug therapy: Secondary | ICD-10-CM | POA: Diagnosis not present

## 2018-06-03 DIAGNOSIS — J45909 Unspecified asthma, uncomplicated: Secondary | ICD-10-CM | POA: Diagnosis not present

## 2018-06-03 LAB — URINALYSIS, ROUTINE W REFLEX MICROSCOPIC
BILIRUBIN URINE: NEGATIVE
Glucose, UA: NEGATIVE mg/dL
KETONES UR: NEGATIVE mg/dL
Leukocytes, UA: NEGATIVE
Nitrite: NEGATIVE
Protein, ur: NEGATIVE mg/dL
Specific Gravity, Urine: 1.016 (ref 1.005–1.030)
pH: 7 (ref 5.0–8.0)

## 2018-06-03 LAB — CBC WITH DIFFERENTIAL/PLATELET
Abs Immature Granulocytes: 0.01 10*3/uL (ref 0.00–0.07)
BASOS ABS: 0 10*3/uL (ref 0.0–0.1)
BASOS PCT: 0 %
EOS PCT: 1 %
Eosinophils Absolute: 0.1 10*3/uL (ref 0.0–0.5)
HCT: 40.6 % (ref 36.0–46.0)
HEMOGLOBIN: 12.8 g/dL (ref 12.0–15.0)
Immature Granulocytes: 0 %
LYMPHS PCT: 42 %
Lymphs Abs: 2.8 10*3/uL (ref 0.7–4.0)
MCH: 29.3 pg (ref 26.0–34.0)
MCHC: 31.5 g/dL (ref 30.0–36.0)
MCV: 92.9 fL (ref 80.0–100.0)
MONO ABS: 0.4 10*3/uL (ref 0.1–1.0)
Monocytes Relative: 6 %
NEUTROS ABS: 3.3 10*3/uL (ref 1.7–7.7)
NRBC: 0 % (ref 0.0–0.2)
Neutrophils Relative %: 51 %
PLATELETS: 293 10*3/uL (ref 150–400)
RBC: 4.37 MIL/uL (ref 3.87–5.11)
RDW: 15.1 % (ref 11.5–15.5)
WBC: 6.5 10*3/uL (ref 4.0–10.5)

## 2018-06-03 LAB — BASIC METABOLIC PANEL
Anion gap: 7 (ref 5–15)
BUN: 11 mg/dL (ref 6–20)
CALCIUM: 9.6 mg/dL (ref 8.9–10.3)
CO2: 23 mmol/L (ref 22–32)
CREATININE: 0.98 mg/dL (ref 0.44–1.00)
Chloride: 109 mmol/L (ref 98–111)
GFR calc Af Amer: >60 mL/min (ref >60)
GLUCOSE: 97 mg/dL (ref 70–99)
Potassium: 3.7 mmol/L (ref 3.5–5.1)
SODIUM: 139 mmol/L (ref 135–145)

## 2018-06-03 LAB — BRAIN NATRIURETIC PEPTIDE: B NATRIURETIC PEPTIDE 5: 5.9 pg/mL (ref 0.0–100.0)

## 2018-06-03 LAB — POC URINE PREG, ED: PREG TEST UR: NEGATIVE

## 2018-06-03 MED ORDER — ONDANSETRON HCL 4 MG/2ML IJ SOLN
4.0000 mg | Freq: Once | INTRAMUSCULAR | Status: AC
  Administered 2018-06-03: 4 mg via INTRAVENOUS
  Filled 2018-06-03: qty 2

## 2018-06-03 MED ORDER — LACTATED RINGERS IV BOLUS
1000.0000 mL | Freq: Once | INTRAVENOUS | Status: AC
  Administered 2018-06-03: 1000 mL via INTRAVENOUS

## 2018-06-03 MED ORDER — HYDROMORPHONE HCL 1 MG/ML IJ SOLN
1.0000 mg | Freq: Once | INTRAMUSCULAR | Status: AC
  Administered 2018-06-03: 1 mg via INTRAVENOUS
  Filled 2018-06-03: qty 1

## 2018-06-03 NOTE — ED Triage Notes (Signed)
Pt reports bila flank pain that radiates around to her pelvic area with nausea.  Sxs onset was 2 days ago.  Has hx of bila kidney stones.  Had lithotripsy to remove kidney stones in July with [facility].

## 2018-06-03 NOTE — ED Notes (Signed)
Pt reports bila flank pain that radiates to her pelvic area x 2 days.  She started to have hematuria today.  Hx of bila nephrolithiasis in July and had a lithotripsy by [facility].  She also endorses nausea but no vomiting.

## 2018-06-03 NOTE — ED Provider Notes (Signed)
[facility] EMERGENCY DEPARTMENT Provider Note   CSN: [id number] Arrival date & time: 06/03/18  1851     History   Chief Complaint Chief Complaint  Patient presents with  . Flank Pain    HPI Jillian Mcgrath is a 37 y.o. female.  The history is provided by the patient and medical records. No language interpreter was used.  Abdominal Pain   This is a new problem. The current episode started more than 1 week ago. The problem occurs constantly. The problem has been rapidly worsening. The pain is located in the suprapubic region (flank pain). The quality of the pain is sharp and shooting. The pain is moderate. Associated symptoms include nausea, dysuria and hematuria. Pertinent negatives include anorexia, fever, diarrhea, melena, vomiting, frequency, arthralgias and myalgias. The symptoms are aggravated by activity. Nothing relieves the symptoms. Past medical history comments: hx of recurrent kidney stones.    Past Medical History:  Diagnosis Date  . Asthma   . History of kidney stones   . Kidney stone    x 14 lithrotripsies  . PONV (postoperative nausea and vomiting)     There are no active problems to display for this patient.   Past Surgical History:  Procedure Laterality Date  . CESAREAN SECTION     x 4  . CYSTOSCOPY/URETEROSCOPY/HOLMIUM LASER/STENT PLACEMENT Left 01/21/2018   Procedure: CYSTOSCOPY /LEFT URETEROSCOPY/HOLMIUM LASER/LEFT STENT PLACEMENT;  Surgeon: Crista Elliot, MD;  Location: [facility];  Service: Urology;  Laterality: Left;  . LITHOTRIPSY    . TONSILLECTOMY       OB History   None      Home Medications    Prior to Admission medications   Medication Sig Start Date End Date Taking? Authorizing Provider  albuterol (PROVENTIL HFA;VENTOLIN HFA) 108 (90 Base) MCG/ACT inhaler Inhale 2 puffs into the lungs every 4 (four) hours as needed for wheezing or shortness of breath. 04/17/18   Harris, Cammy Copa, PA-C  cyclobenzaprine (FLEXERIL) 5  MG tablet Take 1 tablet (5 mg total) by mouth 2 (two) times daily as needed for muscle spasms. 04/18/18   Alvira Monday, MD  naproxen (NAPROSYN) 375 MG tablet Take 1 tablet (375 mg total) by mouth 2 (two) times daily. 04/17/18   Harris, Abigail, PA-C  ondansetron (ZOFRAN ODT) 8 MG disintegrating tablet Take 1 tablet (8 mg total) by mouth every 8 (eight) hours as needed for nausea or vomiting. 04/23/18   Azalia Bilis, MD  phenazopyridine (PYRIDIUM) 200 MG tablet Take 1 tablet (200 mg total) by mouth 3 (three) times daily. 04/23/18   Azalia Bilis, MD  predniSONE (STERAPRED UNI-PAK 21 TAB) 10 MG (21) TBPK tablet Take by mouth daily. Take 6 tabs by mouth daily  for 2 days, then 5 tabs for 2 days, then 4 tabs for 2 days, then 3 tabs for 2 days, 2 tabs for 2 days, then 1 tab by mouth daily for 2 days 04/21/18   Frederik Pear A, PA-C    Family History No family history on file.  Social History Social History   Tobacco Use  . Smoking status: Never Smoker  . Smokeless tobacco: Never Used  Substance Use Topics  . Alcohol use: Never    Frequency: Never  . Drug use: Never     Allergies   Demerol [meperidine] and Sulfa antibiotics   Review of Systems Review of Systems  Constitutional: Positive for activity change. Negative for appetite change, chills, fatigue and fever.  HENT: Negative for ear pain  and sore throat.   Eyes: Negative for pain and visual disturbance.  Respiratory: Negative for cough and shortness of breath.   Cardiovascular: Negative for chest pain and palpitations.  Gastrointestinal: Positive for abdominal pain and nausea. Negative for anorexia, diarrhea, melena and vomiting.  Genitourinary: Positive for dysuria, flank pain and hematuria. Negative for frequency.  Musculoskeletal: Negative for arthralgias, back pain and myalgias.  Skin: Negative for color change and rash.  Neurological: Negative for seizures and syncope.  All other systems reviewed and are  negative.    Physical Exam Updated Vital Signs BP 117/65 (BP Location: Left Arm)   Pulse 83   Temp 99 F (37.2 C) (Oral)   Resp 18   LMP 04/16/2018   SpO2 100%   Physical Exam  Constitutional: She appears well-developed and well-nourished. No distress.  HENT:  Head: Normocephalic and atraumatic.  Eyes: Conjunctivae are normal.  Neck: Neck supple.  Cardiovascular: Normal rate and regular rhythm.  No murmur heard. Pulmonary/Chest: Effort normal and breath sounds normal. No respiratory distress.  Abdominal: Soft. There is tenderness in the suprapubic area. There is CVA tenderness (left). There is no rigidity, no rebound and no guarding.  Musculoskeletal: She exhibits no edema.  Neurological: She is alert.  Skin: Skin is warm and dry.  Psychiatric: She has a normal mood and affect.  Nursing note and vitals reviewed.    ED Treatments / Results  Labs (all labs ordered are listed, but only abnormal results are displayed) Labs Reviewed  URINALYSIS, ROUTINE W REFLEX MICROSCOPIC - Abnormal; Notable for the following components:      Result Value   APPearance HAZY (*)    Hgb urine dipstick SMALL (*)    Bacteria, UA RARE (*)    All other components within normal limits  CBC WITH DIFFERENTIAL/PLATELET  BRAIN NATRIURETIC PEPTIDE  BASIC METABOLIC PANEL  POC URINE PREG, ED    EKG None  Radiology [place] Renal  Result Date: 06/03/2018 CLINICAL DATA:  Bilateral flank pain, history of nephrolithiasis. EXAM: RENAL / URINARY TRACT ULTRASOUND COMPLETE COMPARISON:  04/23/2018 ultrasound FINDINGS: Right Kidney: Renal measurements: 9.9 x 3.7 x 4.5 cm = volume: 83.5 mL . Echogenicity within normal limits. No mass or hydronephrosis visualized. Punctate bilateral nonobstructing renal calculi are noted, the largest approximately 4 mm the upper. Left Kidney: Renal measurements: 10 x 5.3 x 5.2 cm = volume: 143.9 mL. Echogenicity within normal limits. No mass or hydronephrosis visualized. Punctate  nonobstructing renal calculi are identified the largest approximately 4 mm in the lower pole. Bladder: Partially distended in appearance without calculus or mass. No demonstrated ureteral jets on images provided. IMPRESSION: Bilateral nonobstructing nephrolithiasis. No renal mass. Partially distended urinary bladder without focal abnormality. Electronically Signed   By: Tollie Ethavid  Kwon M.D.   On: 06/03/2018 21:19    Procedures Procedures (including critical care time)  Medications Ordered in ED Medications  ondansetron (ZOFRAN) injection 4 mg (4 mg Intravenous Given 06/03/18 1958)  HYDROmorphone (DILAUDID) injection 1 mg (1 mg Intravenous Given 06/03/18 1959)  lactated ringers bolus 1,000 mL (0 mLs Intravenous Stopped 06/03/18 2146)     Initial Impression / Assessment and Plan / ED Course  I have reviewed the triage vital signs and the nursing notes.  Pertinent labs & imaging results that were available during my care of the patient were reviewed by me and considered in my medical decision making (see chart for details).     Patient is a 37 y.o. female with PMHx of recurrent kidney stones  requiring treatment presenting with L sided flank pain that started 4 days ago and suprapubic pain, patient states that she had hematuria that started today and nausea.  Labs remarkable for negative pregnancy test, no signs of infection in urine and creatinine is normal. US kidney showed no hydronephrosis bilateral with no obstructing renal calculi.  Safe to discharge, no indication for admission or additional pain medications at this time. No kidney stone obstruction at this time.   Final Clinical Impressions(s) / ED Diagnoses   Final diagnoses:  Flank pain    ED Discharge Orders    None       Joaquin Courts, MD 06/03/18 2349    Mesner, Barbara Cower, MD 06/04/18 0120

## 2018-06-03 NOTE — ED Notes (Signed)
Pt requested IV be removed as she is ready to go. Asked if she could be discharged "because I ain't doing nothing but sitting in his hallway with my privacy being violated" RN and EDP aware. Pt is leaving AMA

## 2018-07-06 ENCOUNTER — Emergency Department (HOSPITAL_COMMUNITY): Payer: 59

## 2018-07-06 ENCOUNTER — Emergency Department (HOSPITAL_COMMUNITY)
Admission: EM | Admit: 2018-07-06 | Discharge: 2018-07-06 | Disposition: A | Discharge status: Home / Self Care | Payer: 59 | Attending: Emergency Medicine | Admitting: Emergency Medicine

## 2018-07-06 ENCOUNTER — Other Ambulatory Visit: Payer: Self-pay

## 2018-07-06 ENCOUNTER — Encounter (HOSPITAL_COMMUNITY): Payer: Self-pay

## 2018-07-06 DIAGNOSIS — J209 Acute bronchitis, unspecified: Secondary | ICD-10-CM | POA: Diagnosis not present

## 2018-07-06 DIAGNOSIS — R079 Chest pain, unspecified: Secondary | ICD-10-CM | POA: Diagnosis present

## 2018-07-06 DIAGNOSIS — B349 Viral infection, unspecified: Secondary | ICD-10-CM | POA: Diagnosis not present

## 2018-07-06 DIAGNOSIS — J45909 Unspecified asthma, uncomplicated: Secondary | ICD-10-CM | POA: Diagnosis not present

## 2018-07-06 DIAGNOSIS — R109 Unspecified abdominal pain: Secondary | ICD-10-CM

## 2018-07-06 DIAGNOSIS — R1084 Generalized abdominal pain: Secondary | ICD-10-CM | POA: Diagnosis not present

## 2018-07-06 LAB — URINALYSIS, ROUTINE W REFLEX MICROSCOPIC
Bilirubin Urine: NEGATIVE
Glucose, UA: NEGATIVE mg/dL
Hgb urine dipstick: NEGATIVE
Ketones, ur: NEGATIVE mg/dL
Leukocytes, UA: NEGATIVE
Nitrite: NEGATIVE
Protein, ur: NEGATIVE mg/dL
Specific Gravity, Urine: 1.015 (ref 1.005–1.030)
pH: 6 (ref 5.0–8.0)

## 2018-07-06 LAB — CBC WITH DIFFERENTIAL/PLATELET
Abs Immature Granulocytes: 0.01 10*3/uL (ref 0.00–0.07)
Basophils Absolute: 0 10*3/uL (ref 0.0–0.1)
Basophils Relative: 0 %
Eosinophils Absolute: 0.1 10*3/uL (ref 0.0–0.5)
Eosinophils Relative: 2 %
HCT: 38.3 % (ref 36.0–46.0)
Hemoglobin: 11.9 g/dL — ABNORMAL LOW (ref 12.0–15.0)
Immature Granulocytes: 0 %
Lymphocytes Relative: 37 %
Lymphs Abs: 1.4 10*3/uL (ref 0.7–4.0)
MCH: 29.2 pg (ref 26.0–34.0)
MCHC: 31.1 g/dL (ref 30.0–36.0)
MCV: 93.9 fL (ref 80.0–100.0)
Monocytes Absolute: 0.3 10*3/uL (ref 0.1–1.0)
Monocytes Relative: 7 %
Neutro Abs: 2.1 10*3/uL (ref 1.7–7.7)
Neutrophils Relative %: 54 %
Platelets: 231 10*3/uL (ref 150–400)
RBC: 4.08 MIL/uL (ref 3.87–5.11)
RDW: 15 % (ref 11.5–15.5)
WBC: 3.8 10*3/uL — ABNORMAL LOW (ref 4.0–10.5)
nRBC: 0 % (ref 0.0–0.2)

## 2018-07-06 LAB — COMPREHENSIVE METABOLIC PANEL
ALT: 14 U/L (ref 0–44)
AST: 17 U/L (ref 15–41)
Albumin: 3.7 g/dL (ref 3.5–5.0)
Alkaline Phosphatase: 38 U/L (ref 38–126)
Anion gap: 9 (ref 5–15)
BUN: 10 mg/dL (ref 6–20)
CO2: 23 mmol/L (ref 22–32)
Calcium: 9.1 mg/dL (ref 8.9–10.3)
Chloride: 107 mmol/L (ref 98–111)
Creatinine, Ser: 0.82 mg/dL (ref 0.44–1.00)
GFR calc non Af Amer: >60 mL/min (ref >60)
Glucose, Bld: 89 mg/dL (ref 70–99)
Potassium: 3.9 mmol/L (ref 3.5–5.1)
Sodium: 139 mmol/L (ref 135–145)
Total Bilirubin: 0.9 mg/dL (ref 0.3–1.2)
Total Protein: 6.9 g/dL (ref 6.5–8.1)

## 2018-07-06 LAB — WET PREP, GENITAL
Sperm: NONE SEEN
Trich, Wet Prep: NONE SEEN
WBC, Wet Prep HPF POC: NONE SEEN
YEAST WET PREP: NONE SEEN

## 2018-07-06 LAB — I-STAT BETA HCG BLOOD, ED (MC, WL, AP ONLY): I-stat hCG, quantitative: <5 m[IU]/mL (ref <5)

## 2018-07-06 LAB — I-STAT TROPONIN, ED: Troponin i, poc: 0.01 ng/mL (ref 0.00–0.08)

## 2018-07-06 LAB — D-DIMER, QUANTITATIVE (NOT AT ARMC): D DIMER QUANT: 0.43 ug{FEU}/mL (ref 0.00–0.50)

## 2018-07-06 MED ORDER — METRONIDAZOLE 500 MG PO TABS: 500.0000 mg | ORAL_TABLET | Freq: Two times a day (BID) | ORAL | 0 refills | Status: AC

## 2018-07-06 MED ORDER — BENZONATATE 100 MG PO CAPS: 100.0000 mg | ORAL_CAPSULE | Freq: Three times a day (TID) | ORAL | 0 refills | Status: DC | PRN

## 2018-07-06 MED ORDER — DEXAMETHASONE SODIUM PHOSPHATE 10 MG/ML IJ SOLN
10.0000 mg | Freq: Once | INTRAMUSCULAR | Status: AC
  Administered 2018-07-06: 10 mg via INTRAVENOUS
  Filled 2018-07-06: qty 1

## 2018-07-06 MED ORDER — KETOROLAC TROMETHAMINE 15 MG/ML IJ SOLN
15.0000 mg | Freq: Once | INTRAMUSCULAR | Status: AC
  Administered 2018-07-06: 15 mg via INTRAVENOUS
  Filled 2018-07-06: qty 1

## 2018-07-06 MED ORDER — CYCLOBENZAPRINE HCL 10 MG PO TABS: 10.0000 mg | ORAL_TABLET | Freq: Two times a day (BID) | ORAL | 0 refills | Status: DC | PRN

## 2018-07-06 MED ORDER — SODIUM CHLORIDE 0.9 % IV BOLUS
1000.0000 mL | Freq: Once | INTRAVENOUS | Status: AC
  Administered 2018-07-06: 1000 mL via INTRAVENOUS

## 2018-07-06 NOTE — ED Provider Notes (Signed)
[facility] Provider Note   CSN: [id number] Arrival date & time: 07/06/18  [id number]     History   Chief Complaint Chief Complaint  Patient presents with  . Chest Pain  . Shortness of Breath  . Hematuria    HPI Jillian Mcgrath is a 37 y.o. female.  HPI   Presents with chest pain, bilateral flank pain 3 days of chest pain, feels like a sharp pain, worse with deep breaths, worse with stepping outside, laying on stomach Shortness of breath Cough productive of mucus tinged with blood for 3 days Chills, diarrhea for 2 days, subjective fever. Has not checked temperature Bilateral flank pain has been constant since last November.  Blood in urine since November, not as heavy as it was. Vaginal discharge present as well, began yesterday Urine began smelling funny, dysuria for 3-4 days Sexually active, not using condoms No abdominal pain now   Past Medical History:  Diagnosis Date  . Asthma   . History of kidney stones   . Kidney stone    x 14 lithrotripsies  . PONV (postoperative nausea and vomiting)     There are no active problems to display for this patient.   Past Surgical History:  Procedure Laterality Date  . CESAREAN SECTION     x 4  . CYSTOSCOPY/URETEROSCOPY/HOLMIUM LASER/STENT PLACEMENT Left 01/21/2018   Procedure: CYSTOSCOPY /LEFT URETEROSCOPY/HOLMIUM LASER/LEFT STENT PLACEMENT;  Surgeon: Crista ElliotBell, Eugene D III, MD;  Location: [facility];  Service: Urology;  Laterality: Left;  . LITHOTRIPSY    . TONSILLECTOMY       OB History   No obstetric history on file.      Home Medications    Prior to Admission medications   Medication Sig Start Date End Date Taking? Authorizing Provider  albuterol (PROVENTIL HFA;VENTOLIN HFA) 108 (90 Base) MCG/ACT inhaler Inhale 2 puffs into the lungs every 4 (four) hours as needed for wheezing or shortness of breath. 04/17/18   Harris, Abigail, PA-C  benzonatate (TESSALON) 100 MG capsule Take 1  capsule (100 mg total) by mouth 3 (three) times daily as needed for cough. 07/06/18   Alvira MondaySchlossman, Kaelon Weekes, MD  cyclobenzaprine (FLEXERIL) 10 MG tablet Take 1 tablet (10 mg total) by mouth 2 (two) times daily as needed (pain). 07/06/18   Alvira MondaySchlossman, Aubree Doody, MD  metroNIDAZOLE (FLAGYL) 500 MG tablet Take 1 tablet (500 mg total) by mouth 2 (two) times daily for 7 days. 07/06/18 07/13/18  Alvira MondaySchlossman, Keren Alverio, MD  naproxen (NAPROSYN) 375 MG tablet Take 1 tablet (375 mg total) by mouth 2 (two) times daily. 04/17/18   Harris, Abigail, PA-C  ondansetron (ZOFRAN ODT) 8 MG disintegrating tablet Take 1 tablet (8 mg total) by mouth every 8 (eight) hours as needed for nausea or vomiting. 04/23/18   Azalia Bilisampos, Kevin, MD  phenazopyridine (PYRIDIUM) 200 MG tablet Take 1 tablet (200 mg total) by mouth 3 (three) times daily. 04/23/18   Azalia Bilisampos, Kevin, MD  predniSONE (STERAPRED UNI-PAK 21 TAB) 10 MG (21) TBPK tablet Take by mouth daily. Take 6 tabs by mouth daily  for 2 days, then 5 tabs for 2 days, then 4 tabs for 2 days, then 3 tabs for 2 days, 2 tabs for 2 days, then 1 tab by mouth daily for 2 days 04/21/18   Barkley BoardsMcDonald, Mia A, PA-C    Family History History reviewed. No pertinent family history.  Social History Social History   Tobacco Use  . Smoking status: Never Smoker  . Smokeless tobacco: Never  Used  Substance Use Topics  . Alcohol use: Never    Frequency: Never  . Drug use: Never     Allergies   Demerol [meperidine] and Sulfa antibiotics   Review of Systems Review of Systems  Constitutional: Positive for appetite change, chills, fatigue and fever.  HENT: Positive for congestion, ear pain and sore throat.   Eyes: Negative for visual disturbance.  Respiratory: Positive for cough and shortness of breath.   Cardiovascular: Positive for chest pain. Negative for leg swelling.  Gastrointestinal: Positive for diarrhea and nausea. Negative for abdominal pain, constipation and vomiting.  Genitourinary: Positive for  dysuria, flank pain and vaginal discharge.  Musculoskeletal: Positive for back pain.  Neurological: Positive for light-headedness and headaches. Negative for syncope.     Physical Exam Updated Vital Signs BP (!) 101/58   Pulse 83   Temp 98.7 F (37.1 C) (Oral)   Resp (!) 24   Ht 6' (1.829 m)   Wt 68 kg   LMP 05/24/2018 (Within Days) Comment: Pt states she has a tubal ligation.  SpO2 100%   BMI 20.34 kg/m   Physical Exam Vitals signs and nursing note reviewed.  Constitutional:      General: She is not in acute distress.    Appearance: She is well-developed. She is not diaphoretic.  HENT:     Head: Normocephalic and atraumatic.  Eyes:     Conjunctiva/sclera: Conjunctivae normal.  Neck:     Musculoskeletal: Normal range of motion.  Cardiovascular:     Rate and Rhythm: Normal rate and regular rhythm.     Heart sounds: Normal heart sounds. No murmur. No friction rub. No gallop.   Pulmonary:     Effort: Pulmonary effort is normal. No respiratory distress.     Breath sounds: Normal breath sounds. No wheezing or rales.  Abdominal:     General: There is no distension.     Palpations: Abdomen is soft.     Tenderness: There is no abdominal tenderness. There is no guarding.  Genitourinary:    Cervix: Discharge (scant) present. No cervical motion tenderness.     Adnexa:        Right: Tenderness (mild) present.        Left: No tenderness.    Musculoskeletal:        General: No tenderness.  Skin:    General: Skin is warm and dry.     Findings: No erythema or rash.  Neurological:     Mental Status: She is alert and oriented to person, place, and time.      ED Treatments / Results  Labs (all labs ordered are listed, but only abnormal results are displayed) Labs Reviewed  WET PREP, GENITAL - Abnormal; Notable for the following components:      Result Value   Clue Cells Wet Prep HPF POC PRESENT (*)    All other components within normal limits  CBC WITH  DIFFERENTIAL/PLATELET - Abnormal; Notable for the following components:   WBC 3.8 (*)    Hemoglobin 11.9 (*)    All other components within normal limits  URINALYSIS, ROUTINE W REFLEX MICROSCOPIC - Abnormal; Notable for the following components:   Color, Urine YELLOW (*)    APPearance CLEAR (*)    All other components within normal limits  URINE CULTURE  COMPREHENSIVE METABOLIC PANEL  D-DIMER, QUANTITATIVE (NOT AT [facility])  I-STAT BETA HCG BLOOD, ED ([facility], [facility], [facility] ONLY)  I-STAT TROPONIN, ED  GC/CHLAMYDIA PROBE AMP ([place]) NOT AT [facility]  EKG EKG Interpretation  Date/Time:  Saturday July 06 2018 08:45:45 EST Ventricular Rate:  88 PR Interval:    QRS Duration: 89 QT Interval:  370 QTC Calculation: 448 R Axis:   94 Text Interpretation:  Sinus rhythm Borderline right axis deviation Since prior ECG, overall no significant change, ST slightly more concave one beat but no elevion, others more convex  appearance Confirmed by Alvira Monday ([id number]) on 07/06/2018 9:00:11 AM   Radiology Dg Chest 2 View  Result Date: 07/06/2018 CLINICAL DATA:  Cough with central/left-sided chest pain and upper extremity numbness 3 days. EXAM: CHEST - 2 VIEW COMPARISON:  None. FINDINGS: Lungs are adequately inflated without consolidation or effusion. Cardiomediastinal silhouette, bones and soft tissues are normal. IMPRESSION: No active cardiopulmonary disease. Electronically Signed   By: Elberta Fortis M.D.   On: 07/06/2018 10:23    Procedures Procedures (including critical care time)  Medications Ordered in ED Medications  sodium chloride 0.9 % bolus 1,000 mL (0 mLs Intravenous Stopped 07/06/18 1057)  ketorolac (TORADOL) 15 MG/ML injection 15 mg (15 mg Intravenous Given 07/06/18 0938)  dexamethasone (DECADRON) injection 10 mg (10 mg Intravenous Given 07/06/18 1055)     Initial Impression / Assessment and Plan / ED Course  I have reviewed the triage vital signs and the nursing  notes.  Pertinent labs & imaging results that were available during my care of the patient were reviewed by me and considered in my medical decision making (see chart for details).     37yo female presents with concern for chest pain, dyspnea, cough, vaginal discharge. 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  Regarding chest pain and dyspnea, no signs of pneumonia r pneumothorax. Troponin and EKG without signs of acute ischemia. Low risk wells and ddimer negative. Suspect likely cough worsened by asthma. Given decadron, tessalon pearls.   Also reports vaginal discharge, BV present and given flagyl. Doubt PID, TOA, torsion. Declines empiric STI treatment.    Patient discharged in stable condition with understanding of reasons to return.   Final Clinical Impressions(s) / ED Diagnoses   Final diagnoses:  Acute bronchitis, unspecified organism  Viral infection  Chest pain, unspecified type  Flank pain    ED Discharge Orders         Ordered    cyclobenzaprine (FLEXERIL) 10 MG tablet  2 times daily PRN     07/06/18 1047    benzonatate (TESSALON) 100 MG capsule  3 times daily PRN     07/06/18 1047    metroNIDAZOLE (FLAGYL) 500 MG tablet  2 times daily     07/06/18 1048           Alvira Monday, MD 07/06/18 1923

## 2018-07-06 NOTE — ED Notes (Signed)
Got patient undress on the monitor did ekg shown to Dr Zollie BeckersSchlossom patient is resting with call bell in reach

## 2018-07-06 NOTE — ED Triage Notes (Signed)
Pt arrives to ED with complaints of chest pain and bloody urine for about a week. Pt states today was different because it is also difficult to take deep breaths. Pt states she has a hx of asthma and recurrent kidney stones, for which she has had multiple surgeries. Pt also endorses bilateral flank pain. Pt placed in position of comfort with call bell in reach, bed locked and lowered.

## 2018-07-07 LAB — URINE CULTURE

## 2018-07-08 LAB — GC/CHLAMYDIA PROBE AMP (~~LOC~~) NOT AT ARMC
Chlamydia: NEGATIVE
Neisseria Gonorrhea: NEGATIVE

## 2018-08-26 ENCOUNTER — Encounter (HOSPITAL_COMMUNITY): Payer: Self-pay

## 2018-08-26 ENCOUNTER — Emergency Department (HOSPITAL_COMMUNITY): Payer: Medicaid - Out of State

## 2018-08-26 ENCOUNTER — Emergency Department (HOSPITAL_COMMUNITY)
Admission: EM | Admit: 2018-08-26 | Discharge: 2018-08-26 | Disposition: A | Discharge status: Home / Self Care | Payer: Medicaid - Out of State | Attending: Emergency Medicine | Admitting: Emergency Medicine

## 2018-08-26 DIAGNOSIS — N200 Calculus of kidney: Secondary | ICD-10-CM | POA: Insufficient documentation

## 2018-08-26 DIAGNOSIS — R1084 Generalized abdominal pain: Secondary | ICD-10-CM | POA: Diagnosis present

## 2018-08-26 DIAGNOSIS — Z79899 Other long term (current) drug therapy: Secondary | ICD-10-CM | POA: Diagnosis not present

## 2018-08-26 DIAGNOSIS — R109 Unspecified abdominal pain: Secondary | ICD-10-CM

## 2018-08-26 DIAGNOSIS — J45909 Unspecified asthma, uncomplicated: Secondary | ICD-10-CM | POA: Insufficient documentation

## 2018-08-26 LAB — URINALYSIS, ROUTINE W REFLEX MICROSCOPIC
Bilirubin Urine: NEGATIVE
Glucose, UA: NEGATIVE mg/dL
HGB URINE DIPSTICK: NEGATIVE
Ketones, ur: 20 mg/dL — AB
Leukocytes, UA: NEGATIVE
Nitrite: NEGATIVE
Protein, ur: NEGATIVE mg/dL
Specific Gravity, Urine: 1.017 (ref 1.005–1.030)
pH: 6 (ref 5.0–8.0)

## 2018-08-26 LAB — BASIC METABOLIC PANEL
ANION GAP: 8 (ref 5–15)
BUN: 13 mg/dL (ref 6–20)
CALCIUM: 9.2 mg/dL (ref 8.9–10.3)
CO2: 25 mmol/L (ref 22–32)
Chloride: 104 mmol/L (ref 98–111)
Creatinine, Ser: 0.72 mg/dL (ref 0.44–1.00)
GFR calc Af Amer: >60 mL/min (ref >60)
GFR calc non Af Amer: >60 mL/min (ref >60)
Glucose, Bld: 82 mg/dL (ref 70–99)
Potassium: 3.8 mmol/L (ref 3.5–5.1)
Sodium: 137 mmol/L (ref 135–145)

## 2018-08-26 LAB — CBC WITH DIFFERENTIAL/PLATELET
Abs Immature Granulocytes: 0.01 10*3/uL (ref 0.00–0.07)
Basophils Absolute: 0 10*3/uL (ref 0.0–0.1)
Basophils Relative: 0 %
Eosinophils Absolute: 0 10*3/uL (ref 0.0–0.5)
Eosinophils Relative: 1 %
HCT: 41.3 % (ref 36.0–46.0)
Hemoglobin: 12.8 g/dL (ref 12.0–15.0)
Immature Granulocytes: 0 %
LYMPHS ABS: 1.5 10*3/uL (ref 0.7–4.0)
Lymphocytes Relative: 26 %
MCH: 29.2 pg (ref 26.0–34.0)
MCHC: 31 g/dL (ref 30.0–36.0)
MCV: 94.3 fL (ref 80.0–100.0)
MONOS PCT: 8 %
Monocytes Absolute: 0.5 10*3/uL (ref 0.1–1.0)
Neutro Abs: 3.7 10*3/uL (ref 1.7–7.7)
Neutrophils Relative %: 65 %
Platelets: 269 10*3/uL (ref 150–400)
RBC: 4.38 MIL/uL (ref 3.87–5.11)
RDW: 14.1 % (ref 11.5–15.5)
WBC: 5.7 10*3/uL (ref 4.0–10.5)
nRBC: 0 % (ref 0.0–0.2)

## 2018-08-26 LAB — PREGNANCY, URINE: Preg Test, Ur: NEGATIVE

## 2018-08-26 MED ORDER — ONDANSETRON 8 MG PO TBDP: 8.0000 mg | ORAL_TABLET | Freq: Three times a day (TID) | ORAL | 0 refills | Status: DC | PRN

## 2018-08-26 MED ORDER — HYDROMORPHONE HCL 1 MG/ML IJ SOLN
1.0000 mg | Freq: Once | INTRAMUSCULAR | Status: AC
  Administered 2018-08-26: 1 mg via INTRAVENOUS
  Filled 2018-08-26: qty 1

## 2018-08-26 MED ORDER — OXYCODONE-ACETAMINOPHEN 5-325 MG PO TABS
2.0000 | ORAL_TABLET | Freq: Once | ORAL | Status: AC
  Administered 2018-08-26: 2 via ORAL
  Filled 2018-08-26: qty 2

## 2018-08-26 MED ORDER — SODIUM CHLORIDE 0.9 % IV BOLUS
1000.0000 mL | Freq: Once | INTRAVENOUS | Status: AC
  Administered 2018-08-26: 1000 mL via INTRAVENOUS

## 2018-08-26 MED ORDER — ONDANSETRON HCL 4 MG/2ML IJ SOLN
4.0000 mg | Freq: Once | INTRAMUSCULAR | Status: AC
  Administered 2018-08-26: 4 mg via INTRAVENOUS
  Filled 2018-08-26: qty 2

## 2018-08-26 MED ORDER — OXYCODONE-ACETAMINOPHEN 5-325 MG PO TABS: 1.0000 | ORAL_TABLET | Freq: Four times a day (QID) | ORAL | 0 refills | Status: DC | PRN

## 2018-08-26 NOTE — ED Provider Notes (Signed)
[facility]-EMERGENCY DEPT Provider Note   CSN: [id number] Arrival date & time: 08/26/18  1224     History   Chief Complaint Chief Complaint  Patient presents with  . Flank Pain    HPI Jillian KaufmannMelissa Liddy is a 38 y.o. female with a history of kidney stones presenting to the emergency department today with chief complaint of bilateral flank pain x 2 days.  Patient reports the pain as alternating with sharp and dull and is located in lower back over her kidneys.  The pain has been constant and does not radiate.  She reports it as 10 out of 10 in severity.  Patient reports associated hematuria, describing it as seeing blood on the tissue when using the bathroom earlier today.  Also admits to subjective fevers and nausea.  Patient took naproxen yesterday without symptom relief.  This presentation of pain is similar to kidney stones in the past.  She thinks she was told that she has a 6 mm stone in the right kidney and 4 mm stone in the left kidney.  She has had lithotripsy 14 times in the past.  Denies any alleviating factors. Denies vomiting, diarrhea.  She has seen Dr Alvester MorinBell with [facility].  Last LMP was 07/31/2018. History provided by pt. Past Medical History:  Diagnosis Date  . Asthma   . History of kidney stones   . Kidney stone    x 14 lithrotripsies  . PONV (postoperative nausea and vomiting)     There are no active problems to display for this patient.   Past Surgical History:  Procedure Laterality Date  . CESAREAN SECTION     x 4  . CYSTOSCOPY/URETEROSCOPY/HOLMIUM LASER/STENT PLACEMENT Left 01/21/2018   Procedure: CYSTOSCOPY /LEFT URETEROSCOPY/HOLMIUM LASER/LEFT STENT PLACEMENT;  Surgeon: Crista ElliotBell, Eugene D III, MD;  Location: [facility];  Service: Urology;  Laterality: Left;  . LITHOTRIPSY    . TONSILLECTOMY       OB History   No obstetric history on file.      Home Medications    Prior to Admission medications   Medication Sig Start Date End Date  Taking? Authorizing Provider  albuterol (PROVENTIL HFA;VENTOLIN HFA) 108 (90 Base) MCG/ACT inhaler Inhale 2 puffs into the lungs every 4 (four) hours as needed for wheezing or shortness of breath. 04/17/18  Yes Harris, Abigail, PA-C  benzonatate (TESSALON) 100 MG capsule Take 1 capsule (100 mg total) by mouth 3 (three) times daily as needed for cough. 07/06/18  Yes Alvira MondaySchlossman, Erin, MD  cyclobenzaprine (FLEXERIL) 10 MG tablet Take 1 tablet (10 mg total) by mouth 2 (two) times daily as needed (pain). 07/06/18  Yes Alvira MondaySchlossman, Erin, MD  naproxen (NAPROSYN) 375 MG tablet Take 1 tablet (375 mg total) by mouth 2 (two) times daily. 04/17/18  Yes Harris, Abigail, PA-C  ondansetron (ZOFRAN ODT) 8 MG disintegrating tablet Take 1 tablet (8 mg total) by mouth every 8 (eight) hours as needed for nausea or vomiting. 08/26/18   Willowdean Luhmann, Yvonna AlanisKaitlyn E, PA-C  oxyCODONE-acetaminophen (PERCOCET/ROXICET) 5-325 MG tablet Take 1-2 tablets by mouth every 6 (six) hours as needed for severe pain. 08/26/18   Akiko Schexnider E, PA-C  phenazopyridine (PYRIDIUM) 200 MG tablet Take 1 tablet (200 mg total) by mouth 3 (three) times daily. Patient not taking: Reported on 08/26/2018 04/23/18   Azalia Bilisampos, Kevin, MD  predniSONE (STERAPRED UNI-PAK 21 TAB) 10 MG (21) TBPK tablet Take by mouth daily. Take 6 tabs by mouth daily  for 2 days, then 5 tabs  for 2 days, then 4 tabs for 2 days, then 3 tabs for 2 days, 2 tabs for 2 days, then 1 tab by mouth daily for 2 days Patient not taking: Reported on 08/26/2018 04/21/18   Barkley Boards, PA-C    Family History History reviewed. No pertinent family history.  Social History Social History   Tobacco Use  . Smoking status: Never Smoker  . Smokeless tobacco: Never Used  Substance Use Topics  . Alcohol use: Never    Frequency: Never  . Drug use: Never     Allergies   Demerol [meperidine] and Sulfa antibiotics   Review of Systems Review of Systems  Constitutional: Positive for fever.  Negative for chills.  HENT: Negative for congestion, sinus pressure and sore throat.   Eyes: Negative for pain and visual disturbance.  Respiratory: Negative for chest tightness and shortness of breath.   Cardiovascular: Negative for chest pain and palpitations.  Gastrointestinal: Positive for nausea. Negative for abdominal pain, diarrhea and vomiting.  Genitourinary: Positive for dysuria, flank pain, frequency and hematuria. Negative for difficulty urinating and vaginal discharge.  Musculoskeletal: Negative for neck pain.  Skin: Negative for rash and wound.  Neurological: Negative for syncope and headaches.     Physical Exam Updated Vital Signs BP (!) 103/46 (BP Location: Left Arm)   Pulse 87   Temp 98.3 F (36.8 C) (Oral)   Resp 13   LMP 07/31/2018 (Approximate)   SpO2 96%   Physical Exam Vitals signs and nursing note reviewed.  Constitutional:      Appearance: She is not ill-appearing or toxic-appearing.     Comments: Pt is unable to get comfortable on the stretcher because of flank pain  HENT:     Head: Normocephalic and atraumatic.     Nose: Nose normal.     Mouth/Throat:     Mouth: Mucous membranes are moist.     Pharynx: Oropharynx is clear.  Eyes:     Conjunctiva/sclera: Conjunctivae normal.  Neck:     Musculoskeletal: Normal range of motion.  Cardiovascular:     Rate and Rhythm: Normal rate and regular rhythm.     Pulses: Normal pulses.     Heart sounds: Normal heart sounds.  Pulmonary:     Effort: Pulmonary effort is normal.     Breath sounds: Normal breath sounds.  Abdominal:     General: There is no distension.     Palpations: Abdomen is soft.     Tenderness: There is no abdominal tenderness. There is right CVA tenderness and left CVA tenderness. There is no guarding or rebound.  Musculoskeletal: Normal range of motion.  Skin:    General: Skin is warm and dry.     Capillary Refill: Capillary refill takes less than 2 seconds.  Neurological:     Mental  Status: She is alert. Mental status is at baseline.     Motor: No weakness.  Psychiatric:        Behavior: Behavior normal.      ED Treatments / Results  Labs (all labs ordered are listed, but only abnormal results are displayed) Labs Reviewed  URINALYSIS, ROUTINE W REFLEX MICROSCOPIC - Abnormal; Notable for the following components:      Result Value   Ketones, ur 20 (*)    All other components within normal limits  PREGNANCY, URINE  CBC WITH DIFFERENTIAL/PLATELET  BASIC METABOLIC PANEL    EKG None  Radiology US Renal  Result Date: 08/26/2018 CLINICAL DATA:  Bilateral flank pain.  Known kidney stones. EXAM: RENAL / URINARY TRACT ULTRASOUND COMPLETE COMPARISON:  None. FINDINGS: Right Kidney: Renal measurements: 10.4 x 4.1 x 4.7 cm = volume: 108.1 mL. Mild hydronephrosis. Numerous stones with the largest measuring 13 mm. Left Kidney: Renal measurements: 10.4 x 6.3 x 4.6 cm = volume: 161.4 mL. Multiple stones on the left with a representative stone measuring 8 mm. Bladder: Appears normal for degree of bladder distention. IMPRESSION: 1. Bilateral renal stones. No signs of obstruction on the left. Mild age-indeterminate hydronephrosis on the right. Electronically Signed   By: Gerome Sam III M.D   On: 08/26/2018 15:26    Procedures Procedures (including critical care time)  Medications Ordered in ED Medications  sodium chloride 0.9 % bolus 1,000 mL (0 mLs Intravenous Stopped 08/26/18 1514)  ondansetron (ZOFRAN) injection 4 mg (4 mg Intravenous Given 08/26/18 1415)  HYDROmorphone (DILAUDID) injection 1 mg (1 mg Intravenous Given 08/26/18 1416)  sodium chloride 0.9 % bolus 1,000 mL (0 mLs Intravenous Stopped 08/26/18 1633)  oxyCODONE-acetaminophen (PERCOCET/ROXICET) 5-325 MG per tablet 2 tablet (2 tablets Oral Given 08/26/18 1633)     Initial Impression / Assessment and Plan / ED Course  I have reviewed the triage vital signs and the nursing notes.  Pertinent labs & imaging  results that were available during my care of the patient were reviewed by me and considered in my medical decision making (see chart for details).   Patient is afebrile, nontoxic-appearing.  She presents with bilateral flank pain and has a known history of kidney stones.  Will initiate work-up with UA, CBC, BMP, urine pregnancy, renal ultrasound.  Will treat pain with Dilaudid and nausea with Zofran.  Will reassess.  Patient's medical record was reviewed.  She has had multiple visits to the emergency room for flank pain and follows up with [facility]. Pt's labs are unremarkable including CBC, BMP. UA shows 20 ketones, otherwise negative. Do not see signs of UTI.   Pt's renal US shows bilateral renal stones. No signs of obstruction on in left kidney and mild hydronephrosis on the right. Pt's pain improved after dilaudid and 2L IVF and repeat abdominal exam is benign. Pt is well appearing, afebrile. Will discharge home with zofran and pain medication. Pt will need to follow up with urology. Discussed strict ED return precautions. Pt verbalized understanding of and is in agreement with this plan. Pt stable for discharge home at this time. Pt case discussed with Dr. Silverio Lay who agrees with my plan.     Final Clinical Impressions(s) / ED Diagnoses   Final diagnoses:  Flank pain    ED Discharge Orders         Ordered    ondansetron (ZOFRAN ODT) 8 MG disintegrating tablet  Every 8 hours PRN     08/26/18 1616    oxyCODONE-acetaminophen (PERCOCET/ROXICET) 5-325 MG tablet  Every 6 hours PRN     08/26/18 1616           Zahriyah Joo, [place], PA-C 08/27/18 1249    Charlynne Pander, MD 08/28/18 1504

## 2018-08-26 NOTE — Discharge Instructions (Addendum)
You were seen in the emergency department and found to have a kidney stone.  We are sending you home with multiple medications to assist with passing the stone:  ° °-Ibuprofen 800 mg-this is a medication that will help with pain as well as passing the stone.  Please take this every 8 hours.  Take this with food as it can cause stomach upset and at worst stomach bleeding.  Do not take other NSAIDs such as Motrin, Aleve, Advil, Mobic, or Naproxen with this medicine as they are similar and would propagate any potential side effects.  ° °-Percocet-this is a narcotic/controlled substance medication that has potential addicting qualities.  We recommend that you take 1-2 tablets every 6 hours as needed for severe pain.  Do not drive or operate heavy machinery when taking this medicine as it can be sedating. Do not drink alcohol or take other sedating medications when taking this medicine for safety reasons.  Keep this out of reach of small children.  Please be aware this medicine has Tylenol in it (325 mg/tab) do not exceed the maximum dose of Tylenol in a day per over the counter recommendations should you decide to supplement with Tylenol over the counter.  ° °-Zofran-this is an antinausea medication, you may take this every 8 hours as needed for nausea and vomiting, please allow the tablet to dissolve underneath of your tongue.  ° °We have prescribed you new medication(s) today. Discuss the medications prescribed today with your pharmacist as they can have adverse effects and interactions with your other medicines including over the counter and prescribed medications. Seek medical evaluation if you start to experience new or abnormal symptoms after taking one of these medicines, seek care immediately if you start to experience difficulty breathing, feeling of your throat closing, facial swelling, or rash as these could be indications of a more serious allergic reaction ° °Please follow-up with the urology group provided  in your discharge instructions within 3 to 5 days.  Return to the ER for new or worsening symptoms including but not limited to worsening pain not controlled by these medicines, inability to keep fluids down, fever, or any other concerns that you may have. ° °

## 2018-08-26 NOTE — ED Triage Notes (Signed)
Pt presents with c/o bilateral flank pain that started yesterday. Pt also reports hematuria. Pt has a hx of kidney stones.

## 2018-09-16 DIAGNOSIS — N12 Tubulo-interstitial nephritis, not specified as acute or chronic: Secondary | ICD-10-CM

## 2018-09-16 NOTE — ED Notes (Signed)
C/o bilateral flank pain and hematuria x 2 days.  Hx of kidney stones

## 2018-09-16 NOTE — ED Triage Notes (Signed)
C/o bilateral flank pain and hematuria x 2 days.  Hx of kidney stones

## 2018-09-17 ENCOUNTER — Emergency Department: Admit: 2018-09-17 | Payer: MEDICAID | Primary: Family Medicine

## 2018-09-17 ENCOUNTER — Inpatient Hospital Stay
Admit: 2018-09-17 | Discharge: 2018-09-17 | Disposition: A | Discharge status: Home / Self Care | Payer: MEDICAID | Attending: Emergency Medicine

## 2018-09-17 LAB — URINE MICROSCOPIC
RBC, UA: >100 /hpf — ABNORMAL HIGH
RBC: >100 /hpf — ABNORMAL HIGH
WBC, UA: >100 /hpf — ABNORMAL HIGH
WBC: >100 /hpf — ABNORMAL HIGH

## 2018-09-17 LAB — CBC WITH AUTO DIFFERENTIAL
Basophils %: 0 % (ref 0.0–2.0)
Basophils Absolute: 0 10*3/uL (ref 0.0–0.2)
Eosinophils %: 1 % (ref 0.5–7.8)
Eosinophils Absolute: 0.1 10*3/uL (ref 0.0–0.8)
Granulocyte Absolute Count: 0 10*3/uL (ref 0.0–0.5)
Hematocrit: 35 % — ABNORMAL LOW (ref 35.8–46.3)
Hemoglobin: 11.4 g/dL — ABNORMAL LOW (ref 11.7–15.4)
Immature Granulocytes: 0 % (ref 0.0–5.0)
Lymphocytes %: 40 % (ref 13–44)
Lymphocytes Absolute: 3.1 10*3/uL (ref 0.5–4.6)
MCH: 30.5 PG (ref 26.1–32.9)
MCHC: 32.6 g/dL (ref 31.4–35.0)
MCV: 93.6 FL (ref 79.6–97.8)
MPV: 9.8 FL (ref 9.4–12.3)
Monocytes %: 8 % (ref 4.0–12.0)
Monocytes Absolute: 0.6 10*3/uL (ref 0.1–1.3)
NRBC Absolute: 0 10*3/uL (ref 0.0–0.2)
Neutrophils %: 50 % (ref 43–78)
Neutrophils Absolute: 3.8 10*3/uL (ref 1.7–8.2)
Platelets: 233 10*3/uL (ref 150–450)
RBC: 3.74 M/uL — ABNORMAL LOW (ref 4.05–5.2)
RDW: 15.1 % — ABNORMAL HIGH (ref 11.9–14.6)
WBC: 7.7 10*3/uL (ref 4.3–11.1)

## 2018-09-17 LAB — BASIC METABOLIC PANEL
Anion Gap: 4 mmol/L — ABNORMAL LOW (ref 7–16)
BUN: 13 MG/DL (ref 6–23)
CO2: 27 mmol/L (ref 21–32)
Calcium: 8.8 MG/DL (ref 8.3–10.4)
Chloride: 108 mmol/L — ABNORMAL HIGH (ref 98–107)
Creatinine: 0.75 MG/DL (ref 0.6–1.0)
EGFR IF NonAfrican American: >60 mL/min/{1.73_m2} (ref >60)
GFR African American: >60 mL/min/{1.73_m2} (ref >60)
Glucose: 104 mg/dL — ABNORMAL HIGH (ref 65–100)
Potassium: 3.5 mmol/L (ref 3.5–5.1)
Sodium: 139 mmol/L (ref 136–145)

## 2018-09-17 LAB — HCG URINE, QL. - POC
HCG, Pregnancy, Urine, POC: NEGATIVE
Pregnancy test,urine (POC): NEGATIVE

## 2018-09-17 LAB — CBC WITH AUTOMATED DIFF
ABS. BASOPHILS: 0 10*3/uL (ref 0.0–0.2)
ABS. EOSINOPHILS: 0.1 10*3/uL (ref 0.0–0.8)
ABS. IMM. GRANS.: 0 10*3/uL (ref 0.0–0.5)
ABS. LYMPHOCYTES: 3.1 10*3/uL (ref 0.5–4.6)
ABS. MONOCYTES: 0.6 10*3/uL (ref 0.1–1.3)
ABS. NEUTROPHILS: 3.8 10*3/uL (ref 1.7–8.2)
ABSOLUTE NRBC: 0 10*3/uL (ref 0.0–0.2)
BASOPHILS: 0 % (ref 0.0–2.0)
EOSINOPHILS: 1 % (ref 0.5–7.8)
HCT: 35 % — ABNORMAL LOW (ref 35.8–46.3)
HGB: 11.4 g/dL — ABNORMAL LOW (ref 11.7–15.4)
IMMATURE GRANULOCYTES: 0 % (ref 0.0–5.0)
LYMPHOCYTES: 40 % (ref 13–44)
MCH: 30.5 PG (ref 26.1–32.9)
MCHC: 32.6 g/dL (ref 31.4–35.0)
MCV: 93.6 FL (ref 79.6–97.8)
MONOCYTES: 8 % (ref 4.0–12.0)
MPV: 9.8 FL (ref 9.4–12.3)
NEUTROPHILS: 50 % (ref 43–78)
PLATELET: 233 10*3/uL (ref 150–450)
RBC: 3.74 M/uL — ABNORMAL LOW (ref 4.05–5.2)
RDW: 15.1 % — ABNORMAL HIGH (ref 11.9–14.6)
WBC: 7.7 10*3/uL (ref 4.3–11.1)

## 2018-09-17 LAB — METABOLIC PANEL, BASIC
Anion gap: 4 mmol/L — ABNORMAL LOW (ref 7–16)
BUN: 13 MG/DL (ref 6–23)
CO2: 27 mmol/L (ref 21–32)
Calcium: 8.8 MG/DL (ref 8.3–10.4)
Chloride: 108 mmol/L — ABNORMAL HIGH (ref 98–107)
Creatinine: 0.75 MG/DL (ref 0.6–1.0)
GFR est AA: >60 mL/min/{1.73_m2} (ref >60)
GFR est non-AA: >60 mL/min/{1.73_m2} (ref >60)
Glucose: 104 mg/dL — ABNORMAL HIGH (ref 65–100)
Potassium: 3.5 mmol/L (ref 3.5–5.1)
Sodium: 139 mmol/L (ref 136–145)

## 2018-09-17 MED ORDER — CEFTRIAXONE 1 GRAM SOLUTION FOR INJECTION
1 gram | INTRAMUSCULAR | Status: AC
Start: 2018-09-17 — End: 2018-09-17
  Administered 2018-09-17: 08:00:00 via INTRAVENOUS

## 2018-09-17 MED ORDER — ONDANSETRON HCL 8 MG TAB
8 mg | ORAL_TABLET | Freq: Three times a day (TID) | ORAL | 0 refills | Status: AC | PRN
Start: 2018-09-17 — End: ?

## 2018-09-17 MED ORDER — ONDANSETRON (PF) 4 MG/2 ML INJECTION
4 mg/2 mL | INTRAMUSCULAR | Status: AC
Start: 2018-09-17 — End: 2018-09-17
  Administered 2018-09-17: 08:00:00 via INTRAVENOUS

## 2018-09-17 MED ORDER — KETOROLAC TROMETHAMINE 30 MG/ML INJECTION
30 mg/mL (1 mL) | INTRAMUSCULAR | Status: AC
Start: 2018-09-17 — End: 2018-09-17
  Administered 2018-09-17: 08:00:00 via INTRAVENOUS

## 2018-09-17 MED ORDER — AMOXICILLIN CLAVULANATE 875 MG-125 MG TAB
875-125 mg | ORAL_TABLET | Freq: Two times a day (BID) | ORAL | 0 refills | Status: DC
Start: 2018-09-17 — End: 2019-06-13

## 2018-09-17 MED ORDER — SODIUM CHLORIDE 0.9% BOLUS IV
0.9 % | Freq: Once | INTRAVENOUS | Status: AC
Start: 2018-09-17 — End: 2018-09-17
  Administered 2018-09-17: 08:00:00 via INTRAVENOUS

## 2018-09-17 MED ORDER — HYDROMORPHONE (PF) 1 MG/ML IJ SOLN
1 mg/mL | INTRAMUSCULAR | Status: AC
Start: 2018-09-17 — End: 2018-09-17
  Administered 2018-09-17: 08:00:00 via INTRAVENOUS

## 2018-09-17 MED ORDER — HYDROCODONE-ACETAMINOPHEN 7.5 MG-325 MG TAB
ORAL_TABLET | Freq: Three times a day (TID) | ORAL | 0 refills | Status: AC | PRN
Start: 2018-09-17 — End: 2018-09-20

## 2018-09-17 MED FILL — KETOROLAC TROMETHAMINE 30 MG/ML INJECTION: 30 mg/mL (1 mL) | INTRAMUSCULAR | Qty: 1

## 2018-09-17 MED FILL — CEFTRIAXONE 1 GRAM SOLUTION FOR INJECTION: 1 gram | INTRAMUSCULAR | Qty: 1

## 2018-09-17 MED FILL — HYDROMORPHONE (PF) 1 MG/ML IJ SOLN: 1 mg/mL | INTRAMUSCULAR | Qty: 1

## 2018-09-17 MED FILL — ONDANSETRON (PF) 4 MG/2 ML INJECTION: 4 mg/2 mL | INTRAMUSCULAR | Qty: 2

## 2018-09-17 NOTE — ED Notes (Signed)
I have reviewed discharge instructions with the patient.  The patient verbalized understanding.    Patient left ED via Discharge Method: ambulatory to Home with ( family, self,).    Opportunity for questions and clarification provided.       Patient given 3 scripts.         To continue your aftercare when you leave the hospital, you may receive an automated call from our care team to check in on how you are doing.  This is a free service and part of our promise to provide the best care and service to meet your aftercare needs." If you have questions, or wish to unsubscribe from this service please call [phone].  Thank you for Choosing our [facility] Emergency Department.

## 2018-09-17 NOTE — ED Provider Notes (Signed)
38 year old female complaint of bilateral flank pain.  Patient describes it long history of kidney stones in the past.  She has not had one for some time.  She started having pain 2 days ago.  Patient states that she is had to have lithotripsy several times.  She follows with Dr. Gerarda Fraction at [facility].      Flank Pain    This is a recurrent problem. The current episode started more than 2 days ago. The problem has been rapidly worsening. The problem occurs constantly. Patient reports not work related injury.The pain is associated with no known injury. The pain is present in the left side and right side. Associated symptoms include a fever and dysuria. Pertinent negatives include no chest pain, no bladder incontinence, no paresthesias, no paresis and no tingling. She has tried nothing for the symptoms. Risk factors include history of kidney stones.   Blood in Urine    Associated symptoms include flank pain.        Past Medical History:   Diagnosis Date   ??? Anemia of pregnancy 05/09/2008   ??? Chronic kidney disease     kidney stones   ??? General conditions of the kidney and ureter     kidney stones   ??? General conditions of the kidney and ureter     hydronephrosis   ??? GERD (gastroesophageal reflux disease)     mild   ??? Postpartum fever 05/09/2008   ??? Previous cesarean delivery, delivered 05/09/2008       Past Surgical History:   Procedure Laterality Date   ??? CESAREAN DELIVERY ONLY  2001, 2006, 2009   ??? HX CESAREAN SECTION      x4   ??? HX LITHOTRIPSY      x5   ??? HX OTHER SURGICAL  201, 2006    lithotripsy x2   ??? HX OTHER SURGICAL  10/11/09    kidney-stent   ??? HX OTHER SURGICAL      c-section   ??? HX TONSILLECTOMY     ??? HX TUBAL LIGATION      Nov 17, 2008         Family History:   Problem Relation Age of Onset   ??? Cancer Maternal Aunt    ??? Alcohol abuse Neg Hx    ??? Arthritis-rheumatoid Neg Hx    ??? Asthma Neg Hx    ??? Bleeding Prob Neg Hx    ??? Diabetes Neg Hx    ??? Heart Disease Neg Hx    ??? Migraines Neg Hx    ??? Psychiatric Disorder  Neg Hx    ??? Stroke Neg Hx    ??? Elevated Lipids Neg Hx    ??? Headache Neg Hx    ??? Hypertension Neg Hx    ??? Lung Disease Neg Hx    ??? Mental Retardation Neg Hx        Social History     Socioeconomic History   ??? Marital status: SINGLE     Spouse name: Not on file   ??? Number of children: Not on file   ??? Years of education: Not on file   ??? Highest education level: Not on file   Occupational History   ??? Not on file   Social Needs   ??? Financial resource strain: Not on file   ??? Food insecurity:     Worry: Not on file     Inability: Not on file   ??? Transportation needs:     Medical:  Not on file     Non-medical: Not on file   Tobacco Use   ??? Smoking status: Never Smoker   ??? Smokeless tobacco: Never Used   Substance and Sexual Activity   ??? Alcohol use: No   ??? Drug use: No   ??? Sexual activity: Yes     Partners: Male     Birth control/protection: Surgical   Lifestyle   ??? Physical activity:     Days per week: Not on file     Minutes per session: Not on file   ??? Stress: Not on file   Relationships   ??? Social connections:     Talks on phone: Not on file     Gets together: Not on file     Attends religious service: Not on file     Active member of club or organization: Not on file     Attends meetings of clubs or organizations: Not on file     Relationship status: Not on file   ??? Intimate partner violence:     Fear of current or ex partner: Not on file     Emotionally abused: Not on file     Physically abused: Not on file     Forced sexual activity: Not on file   Other Topics Concern   ??? Not on file   Social History Narrative   ??? Not on file         ALLERGIES: Meperidine    Review of Systems   Constitutional: Positive for fever. Negative for activity change.   HENT: Negative.    Eyes: Negative.    Respiratory: Negative.    Cardiovascular: Negative.  Negative for chest pain.   Gastrointestinal: Negative.    Genitourinary: Positive for dysuria and flank pain. Negative for bladder incontinence.   Skin: Negative.    Neurological:  Negative.  Negative for tingling and paresthesias.   Psychiatric/Behavioral: Negative.    All other systems reviewed and are negative.      Vitals:    09/16/18 2315   BP: 100/53   Pulse: 99   Resp: 16   Temp: 98.4 ??F (36.9 ??C)   SpO2: 97%   Weight: 72.6 kg (160 lb)   Height: 6' (1.829 m)            Physical Exam  Vitals signs and nursing note reviewed.   Constitutional:       General: She is not in acute distress.     Appearance: She is well-developed. She is not diaphoretic.   HENT:      Head: Normocephalic and atraumatic.      Right Ear: External ear normal.      Left Ear: External ear normal.      Nose: Nose normal.      Mouth/Throat:      Pharynx: No oropharyngeal exudate.   Eyes:      General: No scleral icterus.        Right eye: No discharge.         Left eye: No discharge.      Conjunctiva/sclera: Conjunctivae normal.      Pupils: Pupils are equal, round, and reactive to light.   Neck:      Musculoskeletal: Normal range of motion and neck supple.      Vascular: No JVD.      Trachea: No tracheal deviation.   Cardiovascular:      Rate and Rhythm: Normal rate and regular rhythm.   Pulmonary:  Effort: Pulmonary effort is normal. No respiratory distress.      Breath sounds: Normal breath sounds. No stridor. No wheezing.   Chest:      Chest wall: No tenderness.   Abdominal:      General: Bowel sounds are normal. There is no distension.      Palpations: Abdomen is soft. There is no mass.      Tenderness: There is no abdominal tenderness. There is right CVA tenderness and left CVA tenderness. There is no guarding or rebound. Negative signs include Murphy's sign, Rovsing's sign and McBurney's sign.   Musculoskeletal: Normal range of motion.         General: No tenderness.   Skin:     General: Skin is warm and dry.      Coloration: Skin is not pale.      Findings: No erythema or rash.   Neurological:      Mental Status: She is alert and oriented to person, place, and time.      Cranial Nerves: No cranial nerve  deficit.   Psychiatric:         Behavior: Behavior normal.         Thought Content: Thought content normal.          MDM  Number of Diagnoses or Management Options  Pyelonephritis:   Renal lithiasis:   Diagnosis management comments: Pain is mostly on the right patient does have greater than 100 white cells and greater than 100 red cells in her urine.  We will treat her for infection with stone and have her follow-up very closely with Dr. Gerarda FractionFlanagan tomorrow.       Amount and/or Complexity of Data Reviewed  Clinical lab tests: ordered and reviewed  Tests in the radiology section of CPT??: ordered and reviewed  Tests in the medicine section of CPT??: ordered and reviewed    Risk of Complications, Morbidity, and/or Mortality  Presenting problems: high  Diagnostic procedures: high  Management options: high           Procedures

## 2018-09-17 NOTE — ED Provider Notes (Signed)
38 year old female complaint of bilateral flank pain.  Patient describes it long history of kidney stones in the past.  She has not had one for some time.  She started having pain 2 days ago.  Patient states that she is had to have lithotripsy several times.  She follows with Dr. Gerarda Fraction at [facility].      Flank Pain    This is a recurrent problem. The current episode started more than 2 days ago. The problem has been rapidly worsening. The problem occurs constantly. Patient reports not work related injury.The pain is associated with no known injury. The pain is present in the left side and right side. Associated symptoms include a fever and dysuria. Pertinent negatives include no chest pain, no bladder incontinence, no paresthesias, no paresis and no tingling. She has tried nothing for the symptoms. Risk factors include history of kidney stones.   Blood in Urine    Associated symptoms include flank pain.        Past Medical History:   Diagnosis Date   ??? Anemia of pregnancy 05/09/2008   ??? Chronic kidney disease     kidney stones   ??? General conditions of the kidney and ureter     kidney stones   ??? General conditions of the kidney and ureter     hydronephrosis   ??? GERD (gastroesophageal reflux disease)     mild   ??? Postpartum fever 05/09/2008   ??? Previous cesarean delivery, delivered 05/09/2008       Past Surgical History:   Procedure Laterality Date   ??? CESAREAN DELIVERY ONLY  2001, 2006, 2009   ??? HX CESAREAN SECTION      x4   ??? HX LITHOTRIPSY      x5   ??? HX OTHER SURGICAL  201, 2006    lithotripsy x2   ??? HX OTHER SURGICAL  10/11/09    kidney-stent   ??? HX OTHER SURGICAL      c-section   ??? HX TONSILLECTOMY     ??? HX TUBAL LIGATION      Nov 17, 2008         Family History:   Problem Relation Age of Onset   ??? Cancer Maternal Aunt    ??? Alcohol abuse Neg Hx    ??? Arthritis-rheumatoid Neg Hx    ??? Asthma Neg Hx    ??? Bleeding Prob Neg Hx    ??? Diabetes Neg Hx    ??? Heart Disease Neg Hx    ??? Migraines Neg Hx     ??? Psychiatric Disorder Neg Hx    ??? Stroke Neg Hx    ??? Elevated Lipids Neg Hx    ??? Headache Neg Hx    ??? Hypertension Neg Hx    ??? Lung Disease Neg Hx    ??? Mental Retardation Neg Hx        Social History     Socioeconomic History   ??? Marital status: SINGLE     Spouse name: Not on file   ??? Number of children: Not on file   ??? Years of education: Not on file   ??? Highest education level: Not on file   Occupational History   ??? Not on file   Social Needs   ??? Financial resource strain: Not on file   ??? Food insecurity:     Worry: Not on file     Inability: Not on file   ??? Transportation needs:     Medical:  Not on file     Non-medical: Not on file   Tobacco Use   ??? Smoking status: Never Smoker   ??? Smokeless tobacco: Never Used   Substance and Sexual Activity   ??? Alcohol use: No   ??? Drug use: No   ??? Sexual activity: Yes     Partners: Male     Birth control/protection: Surgical   Lifestyle   ??? Physical activity:     Days per week: Not on file     Minutes per session: Not on file   ??? Stress: Not on file   Relationships   ??? Social connections:     Talks on phone: Not on file     Gets together: Not on file     Attends religious service: Not on file     Active member of club or organization: Not on file     Attends meetings of clubs or organizations: Not on file     Relationship status: Not on file   ??? Intimate partner violence:     Fear of current or ex partner: Not on file     Emotionally abused: Not on file     Physically abused: Not on file     Forced sexual activity: Not on file   Other Topics Concern   ??? Not on file   Social History Narrative   ??? Not on file         ALLERGIES: Meperidine    Review of Systems   Constitutional: Positive for fever. Negative for activity change.   HENT: Negative.    Eyes: Negative.    Respiratory: Negative.    Cardiovascular: Negative.  Negative for chest pain.   Gastrointestinal: Negative.    Genitourinary: Positive for dysuria and flank pain. Negative for bladder incontinence.    Skin: Negative.    Neurological: Negative.  Negative for tingling and paresthesias.   Psychiatric/Behavioral: Negative.    All other systems reviewed and are negative.      Vitals:    09/16/18 2315   BP: 100/53   Pulse: 99   Resp: 16   Temp: 98.4 ??F (36.9 ??C)   SpO2: 97%   Weight: 72.6 kg (160 lb)   Height: 6' (1.829 m)            Physical Exam  Vitals signs and nursing note reviewed.   Constitutional:       General: She is not in acute distress.     Appearance: She is well-developed. She is not diaphoretic.   HENT:      Head: Normocephalic and atraumatic.      Right Ear: External ear normal.      Left Ear: External ear normal.      Nose: Nose normal.      Mouth/Throat:      Pharynx: No oropharyngeal exudate.   Eyes:      General: No scleral icterus.        Right eye: No discharge.         Left eye: No discharge.      Conjunctiva/sclera: Conjunctivae normal.      Pupils: Pupils are equal, round, and reactive to light.   Neck:      Musculoskeletal: Normal range of motion and neck supple.      Vascular: No JVD.      Trachea: No tracheal deviation.   Cardiovascular:      Rate and Rhythm: Normal rate and regular rhythm.   Pulmonary:  Effort: Pulmonary effort is normal. No respiratory distress.      Breath sounds: Normal breath sounds. No stridor. No wheezing.   Chest:      Chest wall: No tenderness.   Abdominal:      General: Bowel sounds are normal. There is no distension.      Palpations: Abdomen is soft. There is no mass.      Tenderness: There is no abdominal tenderness. There is right CVA tenderness and left CVA tenderness. There is no guarding or rebound. Negative signs include Murphy's sign, Rovsing's sign and McBurney's sign.   Musculoskeletal: Normal range of motion.         General: No tenderness.   Skin:     General: Skin is warm and dry.      Coloration: Skin is not pale.      Findings: No erythema or rash.   Neurological:      Mental Status: She is alert and oriented to person, place, and time.       Cranial Nerves: No cranial nerve deficit.   Psychiatric:         Behavior: Behavior normal.         Thought Content: Thought content normal.          MDM  Number of Diagnoses or Management Options  Pyelonephritis:   Renal lithiasis:   Diagnosis management comments: Pain is mostly on the right patient does have greater than 100 white cells and greater than 100 red cells in her urine.  We will treat her for infection with stone and have her follow-up very closely with Dr. Gerarda Fraction tomorrow.       Amount and/or Complexity of Data Reviewed  Clinical lab tests: ordered and reviewed  Tests in the radiology section of CPT??: ordered and reviewed  Tests in the medicine section of CPT??: ordered and reviewed    Risk of Complications, Morbidity, and/or Mortality  Presenting problems: high  Diagnostic procedures: high  Management options: high           Procedures

## 2018-09-17 NOTE — ED Notes (Signed)
I have reviewed discharge instructions with the patient.  The patient verbalized understanding.    Patient left ED via Discharge Method: ambulatory to Home withfamily, self,).    Opportunity for questions and clarification provided.       Patient given 3 scripts.         To continue your aftercare when you leave the hospital, you may receive an automated call from our care team to check in on how you are doing.  This is a free service and part of our promise to provide the best care and service to meet your aftercare needs.??? If you have questions, or wish to unsubscribe from this service please call [phone].  Thank you for Choosing our [facility].

## 2018-09-30 ENCOUNTER — Ambulatory Visit: Attending: Registered Nurse | Primary: Family Medicine

## 2018-11-29 ENCOUNTER — Emergency Department (HOSPITAL_COMMUNITY): Payer: Medicaid - Out of State

## 2018-11-29 ENCOUNTER — Encounter (HOSPITAL_COMMUNITY): Payer: Self-pay

## 2018-11-29 ENCOUNTER — Other Ambulatory Visit: Payer: Self-pay

## 2018-11-29 ENCOUNTER — Emergency Department (HOSPITAL_COMMUNITY)
Admission: EM | Admit: 2018-11-29 | Discharge: 2018-11-30 | Disposition: A | Discharge status: Home / Self Care | Payer: Medicaid - Out of State | Attending: Emergency Medicine | Admitting: Emergency Medicine

## 2018-11-29 DIAGNOSIS — J45909 Unspecified asthma, uncomplicated: Secondary | ICD-10-CM | POA: Insufficient documentation

## 2018-11-29 DIAGNOSIS — N29 Other disorders of kidney and ureter in diseases classified elsewhere: Secondary | ICD-10-CM | POA: Diagnosis not present

## 2018-11-29 DIAGNOSIS — R319 Hematuria, unspecified: Secondary | ICD-10-CM | POA: Diagnosis not present

## 2018-11-29 DIAGNOSIS — R109 Unspecified abdominal pain: Secondary | ICD-10-CM | POA: Diagnosis not present

## 2018-11-29 DIAGNOSIS — R0789 Other chest pain: Secondary | ICD-10-CM | POA: Diagnosis present

## 2018-11-29 LAB — URINALYSIS, ROUTINE W REFLEX MICROSCOPIC

## 2018-11-29 LAB — COMPREHENSIVE METABOLIC PANEL
ALT: 10 U/L (ref 0–44)
AST: 17 U/L (ref 15–41)
Albumin: 3.5 g/dL (ref 3.5–5.0)
Alkaline Phosphatase: 47 U/L (ref 38–126)
Anion gap: 8 (ref 5–15)
BUN: 8 mg/dL (ref 6–20)
CO2: 21 mmol/L — ABNORMAL LOW (ref 22–32)
Calcium: 8.7 mg/dL — ABNORMAL LOW (ref 8.9–10.3)
Chloride: 109 mmol/L (ref 98–111)
Creatinine, Ser: 0.77 mg/dL (ref 0.44–1.00)
GFR calc Af Amer: >60 mL/min (ref >60)
GFR calc non Af Amer: >60 mL/min (ref >60)
Glucose, Bld: 92 mg/dL (ref 70–99)
Potassium: 3.7 mmol/L (ref 3.5–5.1)
Sodium: 138 mmol/L (ref 135–145)
Total Bilirubin: 0.5 mg/dL (ref 0.3–1.2)
Total Protein: 6.3 g/dL — ABNORMAL LOW (ref 6.5–8.1)

## 2018-11-29 LAB — URINALYSIS, MICROSCOPIC (REFLEX): RBC / HPF: >50 RBC/hpf (ref 0–5)

## 2018-11-29 LAB — CBC
HCT: 35.3 % — ABNORMAL LOW (ref 36.0–46.0)
Hemoglobin: 11.4 g/dL — ABNORMAL LOW (ref 12.0–15.0)
MCH: 30.6 pg (ref 26.0–34.0)
MCHC: 32.3 g/dL (ref 30.0–36.0)
MCV: 94.6 fL (ref 80.0–100.0)
Platelets: 202 10*3/uL (ref 150–400)
RBC: 3.73 MIL/uL — ABNORMAL LOW (ref 3.87–5.11)
RDW: 14.9 % (ref 11.5–15.5)
WBC: 5.7 10*3/uL (ref 4.0–10.5)
nRBC: 0 % (ref 0.0–0.2)

## 2018-11-29 LAB — TROPONIN I: Troponin I: <0.03 ng/mL (ref <0.03)

## 2018-11-29 LAB — LIPASE, BLOOD: Lipase: 42 U/L (ref 11–51)

## 2018-11-29 LAB — I-STAT BETA HCG BLOOD, ED (MC, WL, AP ONLY): I-stat hCG, quantitative: <5 m[IU]/mL (ref <5)

## 2018-11-29 MED ORDER — TAMSULOSIN HCL 0.4 MG PO CAPS: 0.4000 mg | ORAL_CAPSULE | Freq: Every day | ORAL | 0 refills | Status: DC

## 2018-11-29 MED ORDER — SODIUM CHLORIDE 0.9 % IV BOLUS
1000.0000 mL | Freq: Once | INTRAVENOUS | Status: AC
  Administered 2018-11-29: 1000 mL via INTRAVENOUS

## 2018-11-29 MED ORDER — OXYCODONE-ACETAMINOPHEN 5-325 MG PO TABS
1.0000 | ORAL_TABLET | Freq: Once | ORAL | Status: AC
  Administered 2018-11-30: 1 via ORAL
  Filled 2018-11-29: qty 1

## 2018-11-29 MED ORDER — AEROCHAMBER PLUS FLO-VU LARGE MISC: 1.0000 | Freq: Once | Status: DC

## 2018-11-29 MED ORDER — SODIUM CHLORIDE 0.9% FLUSH: 3.0000 mL | Freq: Once | INTRAVENOUS | Status: DC

## 2018-11-29 MED ORDER — ONDANSETRON HCL 4 MG/2ML IJ SOLN
4.0000 mg | Freq: Once | INTRAMUSCULAR | Status: AC
  Administered 2018-11-29: 4 mg via INTRAVENOUS
  Filled 2018-11-29: qty 2

## 2018-11-29 MED ORDER — ALBUTEROL SULFATE HFA 108 (90 BASE) MCG/ACT IN AERS
4.0000 | INHALATION_SPRAY | Freq: Once | RESPIRATORY_TRACT | Status: AC
  Administered 2018-11-30: 4 via RESPIRATORY_TRACT
  Filled 2018-11-29: qty 6.7

## 2018-11-29 MED ORDER — OXYCODONE-ACETAMINOPHEN 5-325 MG PO TABS: 1.0000 | ORAL_TABLET | Freq: Four times a day (QID) | ORAL | 0 refills | Status: DC | PRN

## 2018-11-29 MED ORDER — HYDROMORPHONE HCL 1 MG/ML IJ SOLN
0.5000 mg | Freq: Once | INTRAMUSCULAR | Status: AC
  Administered 2018-11-29: 0.5 mg via INTRAVENOUS
  Filled 2018-11-29: qty 1

## 2018-11-29 NOTE — Discharge Instructions (Signed)
Use Flomax as needed to help past future stones.  Percocet as needed for pain.  Please call to schedule follow-up appointment with your urologist.  Return for significantly worsened pain, fevers, nausea or vomiting.    Your lungs sound clear and your chest x-ray looks good.  Please continue using albuterol inhaler every 4 hours as needed for shortness of breath.  Follow-up with your primary care doctor if this is not improving.  Return if you have significantly worsening shortness of breath, fevers, chest pain or any other new or concerning symptoms.

## 2018-11-29 NOTE — ED Provider Notes (Signed)
[facility] EMERGENCY DEPARTMENT Provider Note   CSN: [id number] Arrival date & time: 11/29/18  1840    History   Chief Complaint Chief Complaint  Patient presents with   Chest Pain   Abdominal Pain   Hematuria    HPI Jillian Mcgrath is a 38 y.o. female.     Jillian Mcgrath is a 38 y.o. female with a history of asthma, and recurrent kidney stones, who presents to the emergency department for evaluation of chest tightness as well as lower abdominal and flank pain and hematuria.  Symptoms started 2 days ago.  She reports bilateral flank pain, worse on the left than the right.  She is noted increasing amounts of blood in her urine and thinks she may have passed a stone yesterday. Denies dysuria or urinary frequency. No vaginal discharge or bleeding, recently had STD testing completed. Reports this pain feels exactly like her previous stones, she is followed by Dr. Ronne Binning with [facility]. She also reports chest tightness intermittently over the past two days, which feels like her asthma. She has also had some intermittent wheezing and mild shortness of breath. No cough or fever. She has been using her albuterol with improvement, but reports it is almost out.  No constant CP, or radiating pain. No lower extremity swelling, pain is not pleuritic. Has not tried anything else to treat her symptoms, no other aggravating or alleviating factors. No known sick contacts.     Past Medical History:  Diagnosis Date   Asthma    History of kidney stones    Kidney stone    x 14 lithrotripsies   PONV (postoperative nausea and vomiting)     There are no active problems to display for this patient.   Past Surgical History:  Procedure Laterality Date   CESAREAN SECTION     x 4   CYSTOSCOPY/URETEROSCOPY/HOLMIUM LASER/STENT PLACEMENT Left 01/21/2018   Procedure: CYSTOSCOPY /LEFT URETEROSCOPY/HOLMIUM LASER/LEFT STENT PLACEMENT;  Surgeon: Crista Elliot, MD;   Location: [facility];  Service: Urology;  Laterality: Left;   LITHOTRIPSY     TONSILLECTOMY       OB History   No obstetric history on file.      Home Medications    Prior to Admission medications   Medication Sig Start Date End Date Taking? Authorizing Provider  oxyCODONE-acetaminophen (PERCOCET) 5-325 MG tablet Take 1 tablet by mouth every 6 (six) hours as needed. 11/29/18   Dartha Lodge, PA-C  tamsulosin (FLOMAX) 0.4 MG CAPS capsule Take 1 capsule (0.4 mg total) by mouth daily. 11/29/18   Dartha Lodge, PA-C    Family History No family history on file.  Social History Social History   Tobacco Use   Smoking status: Never Smoker   Smokeless tobacco: Never Used  Substance Use Topics   Alcohol use: Never    Frequency: Never   Drug use: Never     Allergies   Demerol [meperidine] and Sulfa antibiotics   Review of Systems Review of Systems  Constitutional: Negative for chills and fever.  HENT: Negative.   Eyes: Negative for visual disturbance.  Respiratory: Positive for chest tightness, shortness of breath and wheezing. Negative for cough.   Cardiovascular: Negative for chest pain, palpitations and leg swelling.  Gastrointestinal: Negative for abdominal pain, nausea and vomiting.  Genitourinary: Positive for flank pain and hematuria. Negative for dysuria, vaginal bleeding and vaginal discharge.  Musculoskeletal: Negative for arthralgias and myalgias.  Skin: Negative for color change and rash.  Neurological: Negative for dizziness, syncope and light-headedness.     Physical Exam Updated Vital Signs BP 114/66    Pulse 71    Temp 98.3 F (36.8 C) (Oral)    Resp 16    Ht 6' (1.829 m)    Wt 72.6 kg    SpO2 99%    BMI 21.70 kg/m   Physical Exam Vitals signs and nursing note reviewed.  Constitutional:      General: She is not in acute distress.    Appearance: She is well-developed. She is not diaphoretic.  HENT:     Head: Normocephalic and atraumatic.  Eyes:      General:        Right eye: No discharge.        Left eye: No discharge.     Pupils: Pupils are equal, round, and reactive to light.  Neck:     Musculoskeletal: Neck supple.  Cardiovascular:     Rate and Rhythm: Normal rate and regular rhythm.     Heart sounds: No murmur. No friction rub. No gallop.   Pulmonary:     Effort: Pulmonary effort is normal. No respiratory distress.     Breath sounds: Wheezing present. No rales.     Comments: Patient breathing comfortably with normal effort on room air. Able to speak in full sentences. Lungs with a few faint scattered wheezes, slightly decreased air movement. No rales or rhonchi. Abdominal:     General: Bowel sounds are normal. There is no distension.     Palpations: Abdomen is soft. There is no mass.     Tenderness: There is abdominal tenderness. There is no guarding.     Comments: Abdomen soft and non-distended, bowel sounds present throughout, there is some mild suprapubic tenderness, all other quadrants NTTP, no guarding or peritoneal signs. Mild bilateral flank tenderness, worse on left than right  Musculoskeletal:        General: No deformity.     Right lower leg: She exhibits no tenderness. No edema.     Left lower leg: She exhibits no tenderness. No edema.  Skin:    General: Skin is warm and dry.     Capillary Refill: Capillary refill takes less than 2 seconds.  Neurological:     Mental Status: She is alert.     Coordination: Coordination normal.     Comments: Speech is clear, able to follow commands Moves extremities without ataxia, coordination intact  Psychiatric:        Mood and Affect: Mood normal.        Behavior: Behavior normal.      ED Treatments / Results  Labs (all labs ordered are listed, but only abnormal results are displayed) Labs Reviewed  COMPREHENSIVE METABOLIC PANEL - Abnormal; Notable for the following components:      Result Value   CO2 21 (*)    Calcium 8.7 (*)    Total Protein 6.3 (*)    All  other components within normal limits  CBC - Abnormal; Notable for the following components:   RBC 3.73 (*)    Hemoglobin 11.4 (*)    HCT 35.3 (*)    All other components within normal limits  URINALYSIS, ROUTINE W REFLEX MICROSCOPIC - Abnormal; Notable for the following components:   Color, Urine RED (*)    APPearance TURBID (*)    Glucose, UA   (*)    Value: TEST NOT REPORTED DUE TO COLOR INTERFERENCE OF URINE PIGMENT   Hgb urine dipstick   (*)  Value: TEST NOT REPORTED DUE TO COLOR INTERFERENCE OF URINE PIGMENT   Bilirubin Urine   (*)    Value: TEST NOT REPORTED DUE TO COLOR INTERFERENCE OF URINE PIGMENT   Ketones, ur   (*)    Value: TEST NOT REPORTED DUE TO COLOR INTERFERENCE OF URINE PIGMENT   Protein, ur   (*)    Value: TEST NOT REPORTED DUE TO COLOR INTERFERENCE OF URINE PIGMENT   Nitrite   (*)    Value: TEST NOT REPORTED DUE TO COLOR INTERFERENCE OF URINE PIGMENT   Leukocytes,Ua   (*)    Value: TEST NOT REPORTED DUE TO COLOR INTERFERENCE OF URINE PIGMENT   All other components within normal limits  URINALYSIS, MICROSCOPIC (REFLEX) - Abnormal; Notable for the following components:   Bacteria, UA RARE (*)    All other components within normal limits  URINE CULTURE  LIPASE, BLOOD  TROPONIN I  I-STAT BETA HCG BLOOD, ED ([facility], [facility], [facility] ONLY)    EKG EKG Interpretation  Date/Time:  Friday Nov 29 2018 22:44:23 EDT Ventricular Rate:  52 PR Interval:  156 QRS Duration: 94 QT Interval:  462 QTC Calculation: 430 R Axis:   90 Text Interpretation:  Sinus rhythm Borderline right axis deviation When compared with ECG of EARLIER SAME DATE No significant change was found Confirmed by Dione Booze ([id number]) on 11/30/2018 11:32:18 AM   Radiology Dg Chest Port 1 View  Result Date: 11/29/2018 CLINICAL DATA:  Chest pain with wheezing and shortness of breath EXAM: PORTABLE CHEST 1 VIEW COMPARISON:  July 06, 2018 FINDINGS: Lungs are clear. Heart size and pulmonary vascularity are  normal. No adenopathy. No pneumothorax. No bone lesions. IMPRESSION: No edema or consolidation. Electronically Signed   By: Bretta Bang III M.D.   On: 11/29/2018 21:57   Ct Renal Stone Study  Result Date: 11/29/2018 CLINICAL DATA:  History of urinary tract stones. Lower abdominal pain and hematuria. EXAM: CT ABDOMEN AND PELVIS WITHOUT CONTRAST TECHNIQUE: Multidetector CT imaging of the abdomen and pelvis was performed following the standard protocol without IV contrast. COMPARISON:  CT abdomen and pelvis 04/18/2018 FINDINGS: Lower chest: Lung bases clear.  No pleural or pericardial effusion. Hepatobiliary: No focal liver abnormality is seen. No gallstones, gallbladder wall thickening, or biliary dilatation. Pancreas: Unremarkable. No pancreatic ductal dilatation or surrounding inflammatory changes. Spleen: Normal in size without focal abnormality. Adrenals/Urinary Tract: Changes of medullary nephrocalcinosis are again seen bilaterally. Nonobstructing stones are present in both kidneys, more numerous on the right. Largest stone is in the upper pole of the right kidney and measures 0.6 cm. There is no hydronephrosis on the right or left and no ureteral stones are identified. Urinary bladder is decompressed but otherwise unremarkable. Stomach/Bowel: Stomach is within normal limits. Appendix appears normal. No evidence of bowel wall thickening, distention, or inflammatory changes. Vascular/Lymphatic: No significant vascular findings are present. No enlarged abdominal or pelvic lymph nodes. Reproductive: Uterus and bilateral adnexa are unremarkable. Other: None. Musculoskeletal: No acute or focal bony abnormality. Degenerative disc disease L3-4 and L5-S1 is identified. IMPRESSION: No acute abnormality. 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 Degenerative disc disease L3-4 and L5-S1 as seen on prior CT. Electronically Signed   By: Drusilla Kanner M.D.   On:  11/29/2018 22:50    Procedures Procedures (including critical care time)  Medications Ordered in ED Medications  sodium chloride flush (NS) 0.9 % injection 3 mL (has no administration in time range)  albuterol (VENTOLIN HFA)  108 (90 Base) MCG/ACT inhaler 4 puff (has no administration in time range)  AeroChamber Plus Flo-Vu Large MISC 1 each (has no administration in time range)  oxyCODONE-acetaminophen (PERCOCET/ROXICET) 5-325 MG per tablet 1 tablet (has no administration in time range)  sodium chloride 0.9 % bolus 1,000 mL (0 mLs Intravenous Stopped 11/29/18 2342)  ondansetron (ZOFRAN) injection 4 mg (4 mg Intravenous Given 11/29/18 2207)  HYDROmorphone (DILAUDID) injection 0.5 mg (0.5 mg Intravenous Given 11/29/18 2209)     Initial Impression / Assessment and Plan / ED Course  I have reviewed the triage vital signs and the nursing notes.  Pertinent labs & imaging results that were available during my care of the patient were reviewed by me and considered in my medical decision making (see chart for details).   Pt  Presents with flank pain, suprapubic pain and hematuria, long hx of recurrent kidney stones, followed by urology. Also reports some mild chest tightness ans shortness of breath, feels like her typical asthma, but running out of inhaler. On arrival normal vitals and pt is well appearing. No respiratory distress, lungs with some scattered wheezes.  She has some lower abdominal tenderness without guarding and tenderness over both flanks.  Patient with very mild asthma exacerbation will treat with inhalers, will hold off on steroids at this time as we are assessing for possible urinary tract infection versus kidney stone.  Will check basic labs, troponin, EKG, chest x-ray and CT renal stone study.  Pain medication and nausea medication ordered in addition to bronchodilators.  Urinalysis shows greater than 50 RBCs with very rare bacteria, given that she is not having urinary symptoms do  not feel treating for UTI is indicated.  Urine culture sent.  Labs overall reassuring, no leukocytosis, stable hemoglobin, no acute electrolyte derangements requiring intervention, normal renal and liver function, negative lipase.  Negative pregnancy.  Troponin is negative and EKG without concerning changes.  Chest x-ray is clear with no evidence of pneumonia or other active cardiopulmonary disease.  CT renal stone study shows bilateral medullary nephrocalcinosis and nonobstructing renal stones, no hydronephrosis or other changes within the kidney.  I suspect patient has had recently passed stone causing her pain.  Will treat with a short course of pain medication and Flomax.  Patient provided inhaler for asthma.  Return precautions have been discussed, urology follow-up encouraged.  Patient expresses understanding and agreement with plan.  Discharged home in good condition.  Final Clinical Impressions(s) / ED Diagnoses   Final diagnoses:  Nephrocalcinosis  Hematuria, unspecified type  Flank pain  Mild asthma without complication, unspecified whether persistent    ED Discharge Orders         Ordered    tamsulosin (FLOMAX) 0.4 MG CAPS capsule  Daily     11/29/18 2341    oxyCODONE-acetaminophen (PERCOCET) 5-325 MG tablet  Every 6 hours PRN     11/29/18 2341           Dartha Lodge, PA-C 12/03/18 0009    Tilden Fossa, MD 12/03/18 [phone]

## 2018-11-29 NOTE — ED Triage Notes (Signed)
Pt reports generalized chest tightness since yesterday along with lower abd pain and blood in her urine, hx of asthma and kidney stones. Pt a.o, nad noted

## 2018-12-02 LAB — URINE CULTURE: Culture: >=100000 — AB

## 2018-12-03 ENCOUNTER — Telehealth: Payer: Self-pay | Admitting: Emergency Medicine

## 2018-12-03 NOTE — Progress Notes (Signed)
ED Antimicrobial Stewardship Positive Culture Follow Up   Jillian Mcgrath is an 38 y.o. female who presented to [facility] on 11/29/2018 with a chief complaint of  Chief Complaint  Patient presents with  . Chest Pain  . Abdominal Pain  . Hematuria    Recent Results (from the past 720 hour(s))  Urine culture     Status: Abnormal   Collection Time: 11/29/18 11:09 PM  Result Value Ref Range Status   Specimen Description URINE, RANDOM  Final   Special Requests   Final    NONE Performed at [facility], [address], [place], [place] [address]    Culture >=100,000 COLONIES/mL STAPHYLOCOCCUS SAPROPHYTICUS (A)  Final   Report Status 12/02/2018 FINAL  Final   Organism ID, Bacteria STAPHYLOCOCCUS SAPROPHYTICUS (A)  Final      Susceptibility   Staphylococcus saprophyticus - MIC*    CIPROFLOXACIN <=0.5 SENSITIVE Sensitive     GENTAMICIN <=0.5 SENSITIVE Sensitive     NITROFURANTOIN <=16 SENSITIVE Sensitive     OXACILLIN 0.5 RESISTANT Resistant     TETRACYCLINE <=1 SENSITIVE Sensitive     VANCOMYCIN 1 SENSITIVE Sensitive     TRIMETH/SULFA <=10 SENSITIVE Sensitive     CLINDAMYCIN <=0.25 SENSITIVE Sensitive     RIFAMPIN <=0.5 SENSITIVE Sensitive     Inducible Clindamycin NEGATIVE Sensitive     * >=100,000 COLONIES/mL STAPHYLOCOCCUS SAPROPHYTICUS    []  Treated with N/A, organism resistant to prescribed antimicrobial [x]  Patient discharged originally without antimicrobial agent and treatment is now indicated  New antibiotic prescription: If pt with continued symptoms, start ciprofloxacin 500mg  PO BID x 5 days  ED Provider: Burna Forts, PA   [name], [name] 12/03/2018, 7:38 AM Clinical Pharmacist Monday - Friday phone -  [phone] Saturday - Sunday phone - [phone]

## 2018-12-03 NOTE — Telephone Encounter (Signed)
Post ED Visit - Positive Culture Follow-up: Successful Patient Follow-Up  Culture assessed and recommendations reviewed by:  []  Enzo Bi, Pharm.D. []  Celedonio Miyamoto, Pharm.D., BCPS AQ-ID []  Garvin Fila, Pharm.D., BCPS []  Georgina Pillion, Pharm.D., BCPS []  [place], [address].D., BCPS, AAHIVP []  Estella Husk, Pharm.D., BCPS, AAHIVP [x]  Lysle Pearl, PharmD, BCPS []  Phillips Climes, PharmD, BCPS []  Agapito Games, PharmD, BCPS []  Verlan Friends, PharmD  Positive urine culture  [x]  Patient discharged without antimicrobial prescription and treatment is now indicated []  Organism is resistant to prescribed ED discharge antimicrobial []  Patient with positive blood cultures  Changes discussed with ED provider: Eyvonne Mechanic PA New antibiotic symptom check, still with hematuria and pain, has appt with urology tomorrow, rx for cipro 500 mg po bid x 5 days Called to [facility] [address]    Berle Mull 12/03/2018, 2:02 PM

## 2018-12-16 ENCOUNTER — Other Ambulatory Visit: Payer: Self-pay

## 2018-12-16 ENCOUNTER — Emergency Department (HOSPITAL_COMMUNITY)
Admission: EM | Admit: 2018-12-16 | Discharge: 2018-12-17 | Disposition: A | Discharge status: Home / Self Care | Payer: Self-pay | Attending: Emergency Medicine | Admitting: Emergency Medicine

## 2018-12-16 ENCOUNTER — Emergency Department (HOSPITAL_COMMUNITY): Payer: Self-pay

## 2018-12-16 ENCOUNTER — Encounter (HOSPITAL_COMMUNITY): Payer: Self-pay | Admitting: Emergency Medicine

## 2018-12-16 DIAGNOSIS — J4520 Mild intermittent asthma, uncomplicated: Secondary | ICD-10-CM | POA: Insufficient documentation

## 2018-12-16 DIAGNOSIS — R300 Dysuria: Secondary | ICD-10-CM | POA: Insufficient documentation

## 2018-12-16 DIAGNOSIS — Z79899 Other long term (current) drug therapy: Secondary | ICD-10-CM | POA: Insufficient documentation

## 2018-12-16 DIAGNOSIS — M5440 Lumbago with sciatica, unspecified side: Secondary | ICD-10-CM | POA: Insufficient documentation

## 2018-12-16 LAB — BASIC METABOLIC PANEL
Anion gap: 7 (ref 5–15)
BUN: 14 mg/dL (ref 6–20)
CO2: 23 mmol/L (ref 22–32)
Calcium: 9.4 mg/dL (ref 8.9–10.3)
Chloride: 108 mmol/L (ref 98–111)
Creatinine, Ser: 0.73 mg/dL (ref 0.44–1.00)
GFR calc Af Amer: >60 mL/min (ref >60)
GFR calc non Af Amer: >60 mL/min (ref >60)
Glucose, Bld: 93 mg/dL (ref 70–99)
Potassium: 4 mmol/L (ref 3.5–5.1)
Sodium: 138 mmol/L (ref 135–145)

## 2018-12-16 LAB — URINALYSIS, ROUTINE W REFLEX MICROSCOPIC
Bilirubin Urine: NEGATIVE
Glucose, UA: NEGATIVE mg/dL
Hgb urine dipstick: NEGATIVE
Ketones, ur: NEGATIVE mg/dL
Leukocytes,Ua: NEGATIVE
Nitrite: NEGATIVE
Protein, ur: NEGATIVE mg/dL
Specific Gravity, Urine: 1.017 (ref 1.005–1.030)
pH: 7 (ref 5.0–8.0)

## 2018-12-16 LAB — CBC WITH DIFFERENTIAL/PLATELET
Abs Immature Granulocytes: 0.01 10*3/uL (ref 0.00–0.07)
Basophils Absolute: 0 10*3/uL (ref 0.0–0.1)
Basophils Relative: 0 %
Eosinophils Absolute: 0.1 10*3/uL (ref 0.0–0.5)
Eosinophils Relative: 1 %
HCT: 37.4 % (ref 36.0–46.0)
Hemoglobin: 12 g/dL (ref 12.0–15.0)
Immature Granulocytes: 0 %
Lymphocytes Relative: 34 %
Lymphs Abs: 2.6 10*3/uL (ref 0.7–4.0)
MCH: 30.5 pg (ref 26.0–34.0)
MCHC: 32.1 g/dL (ref 30.0–36.0)
MCV: 94.9 fL (ref 80.0–100.0)
Monocytes Absolute: 0.4 10*3/uL (ref 0.1–1.0)
Monocytes Relative: 6 %
Neutro Abs: 4.5 10*3/uL (ref 1.7–7.7)
Neutrophils Relative %: 59 %
Platelets: 263 10*3/uL (ref 150–400)
RBC: 3.94 MIL/uL (ref 3.87–5.11)
RDW: 14.9 % (ref 11.5–15.5)
WBC: 7.7 10*3/uL (ref 4.0–10.5)
nRBC: 0 % (ref 0.0–0.2)

## 2018-12-16 LAB — PREGNANCY, URINE: Preg Test, Ur: NEGATIVE

## 2018-12-16 LAB — TROPONIN I: Troponin I: <0.03 ng/mL (ref <0.03)

## 2018-12-16 MED ORDER — OXYCODONE-ACETAMINOPHEN 5-325 MG PO TABS
2.0000 | ORAL_TABLET | Freq: Once | ORAL | Status: AC
  Administered 2018-12-16: 2 via ORAL
  Filled 2018-12-16: qty 2

## 2018-12-16 MED ORDER — PREDNISONE 20 MG PO TABS
60.0000 mg | ORAL_TABLET | Freq: Once | ORAL | Status: AC
  Administered 2018-12-16: 60 mg via ORAL
  Filled 2018-12-16: qty 3

## 2018-12-16 NOTE — ED Triage Notes (Signed)
Pt reports having generalized body aches and dysuria for the last 2 weeks pt has hx of Kidney stones.

## 2018-12-16 NOTE — ED Provider Notes (Signed)
[facility]-EMERGENCY DEPT Provider Note   CSN: [id number] Arrival date & time: 12/16/18  1858    History   Chief Complaint Chief Complaint  Patient presents with  . Generalized Body Aches  . Dysuria    HPI Jillian Mcgrath is a 38 y.o. female.     The history is provided by the patient and medical records.     38 year old female with history of asthma, kidney stones, presenting to the ED with multiple complaints.  1.  Chest tightness--states she has history of asthma and feels like her breathing is "off".  States her chest has been tight intermittently over the past several days.  She does report dry cough.  States she always has issues with her asthma during changes in season like it is currently.  She denies any shortness of breath, diaphoresis, nausea, or vomiting.  She does no known cardiac history.  She is not a smoker.  2.  Right-sided back pain--states it feels like her sciatica.  She was told in the past she had degenerative disc disease.  States it has been bothering her more lately than prior.  Pain begins in right lower back and radiates into the buttock and down posterior right leg.  She does feel some tingling in her feet which is common when she has flareups.  She denies any focal numbness or weakness of her legs.  No bowel or bladder incontinence.  She has been taking Tylenol, ibuprofen, and tramadol without relief.  3.  Dysuria--states this is somewhat of a chronic issue as she has had a multitude of kidney stones in the past.  She denies any noted hematuria.  No fever or chills.  No flank pain.  She does follow with a urologist bimonthly.  4.  Questionable pregnancy--states she has not yet had a menstrual cycle since April.  She has not had any significant nausea, vomiting, or breast tenderness.  She has not taken a home pregnancy test.  Past Medical History:  Diagnosis Date  . Asthma   . History of kidney stones   . Kidney stone    x 14  lithrotripsies  . PONV (postoperative nausea and vomiting)     There are no active problems to display for this patient.   Past Surgical History:  Procedure Laterality Date  . CESAREAN SECTION     x 4  . CYSTOSCOPY/URETEROSCOPY/HOLMIUM LASER/STENT PLACEMENT Left 01/21/2018   Procedure: CYSTOSCOPY /LEFT URETEROSCOPY/HOLMIUM LASER/LEFT STENT PLACEMENT;  Surgeon: Crista Elliot, MD;  Location: [facility];  Service: Urology;  Laterality: Left;  . LITHOTRIPSY    . TONSILLECTOMY       OB History   No obstetric history on file.      Home Medications    Prior to Admission medications   Medication Sig Start Date End Date Taking? Authorizing Provider  oxyCODONE-acetaminophen (PERCOCET) 5-325 MG tablet Take 1 tablet by mouth every 6 (six) hours as needed. 11/29/18   Dartha Lodge, PA-C  tamsulosin (FLOMAX) 0.4 MG CAPS capsule Take 1 capsule (0.4 mg total) by mouth daily. 11/29/18   Dartha Lodge, PA-C    Family History History reviewed. No pertinent family history.  Social History Social History   Tobacco Use  . Smoking status: Never Smoker  . Smokeless tobacco: Never Used  Substance Use Topics  . Alcohol use: Never    Frequency: Never  . Drug use: Never     Allergies   Demerol [meperidine] and Sulfa antibiotics  Review of Systems Review of Systems  Respiratory: Positive for chest tightness.   Genitourinary: Positive for dysuria and menstrual problem.  Musculoskeletal: Positive for back pain.  All other systems reviewed and are negative.    Physical Exam Updated Vital Signs BP 99/62 (BP Location: Left Arm)   Pulse 72   Temp 99.3 F (37.4 C) (Oral)   Resp 16   Ht 6' (1.829 m)   Wt 75.5 kg   LMP  (LMP Unknown)   SpO2 100%   BMI 22.57 kg/m   Physical Exam Vitals signs and nursing note reviewed.  Constitutional:      Appearance: She is well-developed.  HENT:     Head: Normocephalic and atraumatic.  Eyes:     Conjunctiva/sclera: Conjunctivae normal.      Pupils: Pupils are equal, round, and reactive to light.  Neck:     Musculoskeletal: Normal range of motion.  Cardiovascular:     Rate and Rhythm: Normal rate and regular rhythm.     Heart sounds: Normal heart sounds.  Pulmonary:     Effort: Pulmonary effort is normal. No respiratory distress.     Breath sounds: Normal breath sounds. No stridor.  Abdominal:     General: Bowel sounds are normal. There is no distension.     Palpations: Abdomen is soft. There is no mass.  Musculoskeletal: Normal range of motion.     Comments: Some mild tenderness of right lower back, no acute deformity noted, normal strength/sensation of both legs, no foot drop, normal gait  Skin:    General: Skin is warm and dry.  Neurological:     Mental Status: She is alert and oriented to person, place, and time.      ED Treatments / Results  Labs (all labs ordered are listed, but only abnormal results are displayed) Labs Reviewed  URINE CULTURE  CBC WITH DIFFERENTIAL/PLATELET  BASIC METABOLIC PANEL  TROPONIN I  URINALYSIS, ROUTINE W REFLEX MICROSCOPIC  PREGNANCY, URINE    EKG EKG Interpretation  Date/Time:  Monday December 16 2018 22:37:32 EDT Ventricular Rate:  64 PR Interval:    QRS Duration: 90 QT Interval:  408 QTC Calculation: 421 R Axis:   91 Text Interpretation:  Sinus rhythm Borderline right axis deviation Confirmed by Virgina NorfolkAdam, [place] [phone]([id number]) on 12/16/2018 10:39:35 PM   Radiology Dg Chest 2 View  Result Date: 12/17/2018 CLINICAL DATA:  Generalized body aches EXAM: CHEST - 2 VIEW COMPARISON:  11/29/2018 FINDINGS: The heart size and mediastinal contours are within normal limits. Both lungs are clear. The visualized skeletal structures are unremarkable. IMPRESSION: No active cardiopulmonary disease. Electronically Signed   By: Katherine Mantlehristopher  Green M.D.   On: 12/17/2018 00:14    Procedures Procedures (including critical care time)  Medications Ordered in ED Medications  oxyCODONE-acetaminophen  (PERCOCET/ROXICET) 5-325 MG per tablet 2 tablet (2 tablets Oral Given 12/16/18 2313)  predniSONE (DELTASONE) tablet 60 mg (60 mg Oral Given 12/16/18 2313)  ketorolac (TORADOL) 30 MG/ML injection 30 mg (30 mg Intravenous Given 12/17/18 0115)     Initial Impression / Assessment and Plan / ED Course  I have reviewed the triage vital signs and the nursing notes.  Pertinent labs & imaging results that were available during my care of the patient were reviewed by me and considered in my medical decision making (see chart for details).  38 year old female here with multiple complaints.  1.  Asthma--states she always has flare ups of asthma during spring and when seasons change.  States lately  her breathing has felt "off".  She denies any shortness of breath but states her chest feels tight.  Her lungs are clear here without any wheezes or rhonchi.  Her vitals are stable on room air.  Chest x-ray is negative.  She has not had any known sick contacts or COVID exposures.  Can continue symptomatic care with inhalers at home.  2.  Back pain--localized to right lower back, states it feels like her prior issues with sciatica.  She does have known degenerative disc disease of the lumbar spine.  She has no focal neurologic deficits here concerning for cauda equina.  She remains ambulatory.  Do not feel she needs any emergent imaging.  Offered pain control.  3.  Dysuria--has history of chronic kidney stones and this is somewhat of a chronic issue for her.  She is not had any flank pain or fever.  She denies any hematuria.  She had CT scan about 2 weeks ago that was grossly normal aside from nonobstructing renal stones.  Her UA here is completely benign.  Can follow-up with urology.  4.  Questionable pregnancy--states she has not yet had a menstrual cycle for the month of May.  She has not had any nausea, breast tenderness, or other symptoms of pregnancy.  Urine pregnancy test is negative today.  Can continue checking at  home if I have continued concerns.  Can also follow-up with OB/GYN.  Patient will need to follow-up with her primary care doctor for aforementioned complaints.  She can return here for any new or acute changes.  Final Clinical Impressions(s) / ED Diagnoses   Final diagnoses:  Acute right-sided low back pain with sciatica, sciatica laterality unspecified  Dysuria  Mild intermittent asthma without complication    ED Discharge Orders         Ordered    oxyCODONE-acetaminophen (PERCOCET) 5-325 MG tablet  Every 4 hours PRN     12/17/18 0110    predniSONE (DELTASONE) 20 MG tablet     12/17/18 0110           Garlon Hatchet, PA-C 12/17/18 0129    Virgina Norfolk, DO 12/17/18 1230

## 2018-12-17 MED ORDER — KETOROLAC TROMETHAMINE 30 MG/ML IJ SOLN
30.0000 mg | Freq: Once | INTRAMUSCULAR | Status: AC
  Administered 2018-12-17: 30 mg via INTRAVENOUS
  Filled 2018-12-17: qty 1

## 2018-12-17 MED ORDER — PREDNISONE 20 MG PO TABS: ORAL_TABLET | ORAL | 0 refills | Status: DC

## 2018-12-17 MED ORDER — OXYCODONE-ACETAMINOPHEN 5-325 MG PO TABS: 1.0000 | ORAL_TABLET | ORAL | 0 refills | Status: DC | PRN

## 2018-12-17 NOTE — Discharge Instructions (Signed)
Take the prescribed medication as directed.  Do not drive while taking pain medication.  Can use heat/ice on the back as well. Follow-up with your primary care doctor. Return to the ED for new or worsening symptoms.

## 2018-12-18 LAB — URINE CULTURE: Culture: NO GROWTH

## 2019-04-20 ENCOUNTER — Emergency Department (HOSPITAL_COMMUNITY): Payer: Self-pay

## 2019-04-20 ENCOUNTER — Other Ambulatory Visit: Payer: Self-pay

## 2019-04-20 ENCOUNTER — Emergency Department (HOSPITAL_COMMUNITY)
Admission: EM | Admit: 2019-04-20 | Discharge: 2019-04-20 | Disposition: A | Discharge status: Home / Self Care | Payer: Self-pay | Attending: Emergency Medicine | Admitting: Emergency Medicine

## 2019-04-20 ENCOUNTER — Encounter (HOSPITAL_COMMUNITY): Payer: Self-pay | Admitting: Emergency Medicine

## 2019-04-20 DIAGNOSIS — Z79899 Other long term (current) drug therapy: Secondary | ICD-10-CM | POA: Insufficient documentation

## 2019-04-20 DIAGNOSIS — N200 Calculus of kidney: Secondary | ICD-10-CM

## 2019-04-20 DIAGNOSIS — N29 Other disorders of kidney and ureter in diseases classified elsewhere: Secondary | ICD-10-CM

## 2019-04-20 DIAGNOSIS — R109 Unspecified abdominal pain: Secondary | ICD-10-CM

## 2019-04-20 DIAGNOSIS — J45909 Unspecified asthma, uncomplicated: Secondary | ICD-10-CM | POA: Insufficient documentation

## 2019-04-20 LAB — CBC
HCT: 39.1 % (ref 36.0–46.0)
Hemoglobin: 12.5 g/dL (ref 12.0–15.0)
MCH: 29.8 pg (ref 26.0–34.0)
MCHC: 32 g/dL (ref 30.0–36.0)
MCV: 93.1 fL (ref 80.0–100.0)
Platelets: 277 10*3/uL (ref 150–400)
RBC: 4.2 MIL/uL (ref 3.87–5.11)
RDW: 16 % — ABNORMAL HIGH (ref 11.5–15.5)
WBC: 5 10*3/uL (ref 4.0–10.5)
nRBC: 0 % (ref 0.0–0.2)

## 2019-04-20 LAB — URINALYSIS, ROUTINE W REFLEX MICROSCOPIC
Bilirubin Urine: NEGATIVE
Glucose, UA: NEGATIVE mg/dL
Hgb urine dipstick: NEGATIVE
Ketones, ur: NEGATIVE mg/dL
Leukocytes,Ua: NEGATIVE
Nitrite: NEGATIVE
Protein, ur: NEGATIVE mg/dL
Specific Gravity, Urine: 1.018 (ref 1.005–1.030)
pH: 7 (ref 5.0–8.0)

## 2019-04-20 LAB — POC URINE PREG, ED: Preg Test, Ur: NEGATIVE

## 2019-04-20 LAB — I-STAT BETA HCG BLOOD, ED (MC, WL, AP ONLY): I-stat hCG, quantitative: <5 m[IU]/mL (ref <5)

## 2019-04-20 LAB — BASIC METABOLIC PANEL
Anion gap: 6 (ref 5–15)
BUN: 8 mg/dL (ref 6–20)
CO2: 25 mmol/L (ref 22–32)
Calcium: 9.3 mg/dL (ref 8.9–10.3)
Chloride: 107 mmol/L (ref 98–111)
Creatinine, Ser: 0.76 mg/dL (ref 0.44–1.00)
GFR calc Af Amer: >60 mL/min (ref >60)
GFR calc non Af Amer: >60 mL/min (ref >60)
Glucose, Bld: 93 mg/dL (ref 70–99)
Potassium: 3.9 mmol/L (ref 3.5–5.1)
Sodium: 138 mmol/L (ref 135–145)

## 2019-04-20 MED ORDER — ONDANSETRON HCL 4 MG/2ML IJ SOLN
4.0000 mg | Freq: Once | INTRAMUSCULAR | Status: AC
  Administered 2019-04-20: 11:00:00 4 mg via INTRAVENOUS
  Filled 2019-04-20: qty 2

## 2019-04-20 MED ORDER — FENTANYL CITRATE (PF) 100 MCG/2ML IJ SOLN
50.0000 ug | Freq: Once | INTRAMUSCULAR | Status: AC
  Administered 2019-04-20: 50 ug via INTRAVENOUS
  Filled 2019-04-20: qty 2

## 2019-04-20 MED ORDER — METHOCARBAMOL 500 MG PO TABS: 500.0000 mg | ORAL_TABLET | Freq: Two times a day (BID) | ORAL | 0 refills | Status: AC

## 2019-04-20 MED ORDER — MELOXICAM 15 MG PO TABS: 15.0000 mg | ORAL_TABLET | Freq: Every day | ORAL | 0 refills | Status: AC

## 2019-04-20 NOTE — ED Triage Notes (Signed)
Pt stated, I have a kidney stone. Ive got pain on the left side and Ive had a kidney stone before. I do have some blood in urine. It started last Thursday. Ive not called my Dr. I just came here.

## 2019-04-20 NOTE — ED Provider Notes (Signed)
[facility] EMERGENCY DEPARTMENT Provider Note   CSN: [id number] Arrival date & time: 04/20/19  4098     History   Chief Complaint Chief Complaint  Patient presents with  . Back Pain  . Abdominal Pain    left  . Flank Pain    HPI Jillian Mcgrath is a 38 y.o. female with an extensive past medical history including nephrocalcinosis.  She has an extensive urologic history including multiple episodes of kidney stones requiring intervention including cystoscopy with stent placement and multiple lithotripsies.  Review of the patient's last urology visit and states that she has a history of chronic complaint of flank pain, multiple ER visits greater than 17 to multiple locations over the past year.  She has chronic nephro lithiasis without ureteral obstruction or hydronephrosis and the patient has had multiple CT scans over the years. The patient states "I think I have a kidney stone and a slipped disc in my back.  She states that she began having midline back pain and pain worse in the left flank radiating into the left side of her abdomen beginning Thursday.  She states that it is constant but colicky.  She states that she does not think she is "obstructed, because I am to come for that."  She complains of some suprapubic pain today but denies any dysuria, hematuria frequency or urgency.  The pain is not worsened with movement.  She denies any weakness, numbness or tingling of the lower extremities.      Back Pain Associated symptoms: abdominal pain and pelvic pain   Associated symptoms: no fever, no numbness and no weakness   Abdominal Pain Associated symptoms: no chills and no fever   Flank Pain Associated symptoms include abdominal pain.    Past Medical History:  Diagnosis Date  . Asthma   . History of kidney stones   . Kidney stone    x 14 lithrotripsies  . PONV (postoperative nausea and vomiting)     There are no active problems to display for this  patient.   Past Surgical History:  Procedure Laterality Date  . CESAREAN SECTION     x 4  . CYSTOSCOPY/URETEROSCOPY/HOLMIUM LASER/STENT PLACEMENT Left 01/21/2018   Procedure: CYSTOSCOPY /LEFT URETEROSCOPY/HOLMIUM LASER/LEFT STENT PLACEMENT;  Surgeon: Crista Elliot, MD;  Location: [facility];  Service: Urology;  Laterality: Left;  . LITHOTRIPSY    . TONSILLECTOMY       OB History   No obstetric history on file.      Home Medications    Prior to Admission medications   Medication Sig Start Date End Date Taking? Authorizing Provider  oxyCODONE-acetaminophen (PERCOCET) 5-325 MG tablet Take 1 tablet by mouth every 4 (four) hours as needed. 12/17/18   Garlon Hatchet, PA-C  predniSONE (DELTASONE) 20 MG tablet Take 40 mg by mouth daily for 3 days, then 20mg  by mouth daily for 3 days, then 10mg  daily for 3 days 12/17/18   , PA-C  tamsulosin (FLOMAX) 0.4 MG CAPS capsule Take 1 capsule (0.4 mg total) by mouth daily. 11/29/18   Garlon Hatchet, PA-C    Family History No family history on file.  Social History Social History   Tobacco Use  . Smoking status: Never Smoker  . Smokeless tobacco: Never Used  Substance Use Topics  . Alcohol use: Never    Frequency: Never  . Drug use: Never     Allergies   Demerol [meperidine] and Sulfa antibiotics   Review  of Systems Review of Systems  Constitutional: Negative for chills and fever.  HENT: Negative.   Respiratory: Negative.   Cardiovascular: Negative.   Gastrointestinal: Positive for abdominal pain.  Endocrine: Negative.   Genitourinary: Positive for flank pain and pelvic pain.  Musculoskeletal: Positive for back pain.  Neurological: Negative for weakness and numbness.  All other systems reviewed and are negative.    Physical Exam Updated Vital Signs BP (!) 94/59 (BP Location: Left Arm)   Pulse 74   Temp 98.8 F (37.1 C) (Oral)   Resp 12   Ht 6' (1.829 m)   Wt 72.6 kg   LMP 04/13/2019   SpO2 98%   BMI  21.70 kg/m   Physical Exam Vitals signs and nursing note reviewed.  Constitutional:      General: She is not in acute distress.    Appearance: She is well-developed. She is not diaphoretic.  HENT:     Head: Normocephalic and atraumatic.  Eyes:     General: No scleral icterus.    Conjunctiva/sclera: Conjunctivae normal.  Neck:     Musculoskeletal: Normal range of motion.  Cardiovascular:     Rate and Rhythm: Normal rate and regular rhythm.     Heart sounds: Normal heart sounds. No murmur. No friction rub. No gallop.   Pulmonary:     Effort: Pulmonary effort is normal. No respiratory distress.     Breath sounds: Normal breath sounds.  Abdominal:     General: Bowel sounds are normal. There is no distension.     Palpations: Abdomen is soft. There is no mass.     Tenderness: There is abdominal tenderness in the suprapubic area. There is left CVA tenderness. There is no right CVA tenderness or guarding.  Skin:    General: Skin is warm and dry.  Neurological:     Mental Status: She is alert and oriented to person, place, and time.  Psychiatric:        Behavior: Behavior normal.      ED Treatments / Results  Labs (all labs ordered are listed, but only abnormal results are displayed) Labs Reviewed  URINALYSIS, ROUTINE W REFLEX MICROSCOPIC  BASIC METABOLIC PANEL  CBC  POC URINE PREG, ED  I-STAT BETA HCG BLOOD, ED ([facility], [facility], [facility] ONLY)    EKG None  Radiology No results found.  Procedures Procedures (including critical care time)  Medications Ordered in ED Medications - No data to display   Initial Impression / Assessment and Plan / ED Course  I have reviewed the triage vital signs and the nursing notes.  Pertinent labs & imaging results that were available during my care of the patient were reviewed by me and considered in my medical decision making (see chart for details).    38 year old female with multiple ER visits, history of nephrocalcinosis and multiple kidney  stones requiring intervention, [place] [place].  I personally reviewed the patient's chart which show a negative pregnancy test, BMP shows no abnormalities including no elevated creatinine or BUN.  Patient CBC without significant abnormality, no elevated white blood cell count or anemia.  Patient's urinalysis shows some haziness without red blood cells, white blood cells, bacteria or crystals.  I personally reviewed the patient's KUB and renal ultrasound which shows chronic nephrocalcinosis and nephrolithiasis without evidence of ureterolithiasis, hydronephrosis or other obstructive pattern.   Given the large differential diagnosis for Kenitha Laba  the decision making in this case is of high complexity.  After evaluating  all of the data points in this case, the presentation of Christiona Chavis is NOT consistent with an infected stone, nephric abscess, sepsis, or renal failure.  Similarly, this presentation is NOT consistent with AAA; Mesenteric Ischemia; Bowel Perforation; Bowel Obstruction; Sigmoid Volvulus; Diverticulitis; Appendicitis; Peritonitis; Cholecystitis, ascending cholangitis or other gallbladder disease; perforated ulcer; significant GI bleeding, splenic rupture/infarction; Hepatic abscess; or other surgical/acute abdomen.  Further, this presentation is NOT consistent with ACS or Myocardial Ischemia; Pulmonary Embolism; fistula; incarcerated hernia; Pancreatitis, Aortic Dissection; Diabetic Ketoacidosis; Ischemic colitis; Psoas or other abscess; Methanol poisoning; Heavy metal toxicity; or porphyria.  The case is NOT consistent with Fitz-Hugh-Curtis Syndrome, Ectopic Pregnancy, Placental Abruption, PID, Tubo-ovarian abscess, Ovarian Torsion, or STI.  Nor is this presentation consistent with acute coronary syndrome, pulmonary embolism, dissection, borhaave's, arrythmia, pneumothorax, cardiac tamponade, or other emergent cardiopulmonary condition.   Finally, this presentation is NOT consistent with pyelonephritis, urinary infection, pneumonia, or other focal bacterial infection.  Strict return and follow-up precautions have been given by me personally to the patient/family/caregiver(s).  Data Reviewed/Counseling: I have reviwed the patient's vital signs, nursing notes, and other relevant tests/information. I had a detailed discussion regarding the historical points, exam findings, and any diagnostic results supporting the discharge diagnosis. I also discussed the need for outpatient follow-up and the need to return to the ED if symptoms worsen or if there are any questions or concerns that arise at home.   Final Clinical Impressions(s) / ED Diagnoses   Final diagnoses:  Chronic nephrocalcinosis  Bilateral nephrolithiasis  Flank pain    ED Discharge Orders    None       Margarita Mail, PA-C 04/20/19 1607    Carmin Muskrat, MD 04/21/19 [phone]

## 2019-04-20 NOTE — ED Notes (Signed)
Pt verbalized understanding of discharge instructions, prescriptions reviewed. Pt has no further questions at this time.

## 2019-04-20 NOTE — Discharge Instructions (Signed)
You have kidney stones and calcium deposits in your kidneys that are unchanged from your previous evaluations.  There is no evidence that you have a stone in your ureter or bladder causing the pain that you are having today.  There is no evidence of obstruction.  Your urine shows no evidence of crystals, white blood cells or blood in the urine.  There does not appear to be evidence of an infection.  Your kidney function is normal.  I am discharging you today with anti-inflammatory medicines and a muscle relaxer.  You may follow-up with your kidney specialist.  Return to the emergency department if you develop fever, or intractable nausea and vomiting.

## 2019-04-20 NOTE — ED Notes (Signed)
Patient transported to Ultrasound 

## 2019-05-19 ENCOUNTER — Other Ambulatory Visit: Payer: Self-pay

## 2019-05-19 ENCOUNTER — Emergency Department (HOSPITAL_COMMUNITY)
Admission: EM | Admit: 2019-05-19 | Discharge: 2019-05-20 | Disposition: A | Discharge status: Home / Self Care | Payer: Medicaid Other | Attending: Emergency Medicine | Admitting: Emergency Medicine

## 2019-05-19 ENCOUNTER — Encounter (HOSPITAL_COMMUNITY): Payer: Self-pay | Admitting: Emergency Medicine

## 2019-05-19 ENCOUNTER — Emergency Department (HOSPITAL_COMMUNITY): Payer: Medicaid Other

## 2019-05-19 DIAGNOSIS — J4541 Moderate persistent asthma with (acute) exacerbation: Secondary | ICD-10-CM

## 2019-05-19 DIAGNOSIS — R319 Hematuria, unspecified: Secondary | ICD-10-CM | POA: Diagnosis not present

## 2019-05-19 DIAGNOSIS — R0789 Other chest pain: Secondary | ICD-10-CM | POA: Diagnosis present

## 2019-05-19 LAB — URINALYSIS, ROUTINE W REFLEX MICROSCOPIC
Bilirubin Urine: NEGATIVE
Glucose, UA: NEGATIVE mg/dL
Hgb urine dipstick: NEGATIVE
Ketones, ur: 5 mg/dL — AB
Leukocytes,Ua: NEGATIVE
Nitrite: NEGATIVE
Protein, ur: NEGATIVE mg/dL
Specific Gravity, Urine: 1.009 (ref 1.005–1.030)
pH: 6 (ref 5.0–8.0)

## 2019-05-19 LAB — I-STAT BETA HCG BLOOD, ED (MC, WL, AP ONLY): I-stat hCG, quantitative: <5 m[IU]/mL (ref <5)

## 2019-05-19 LAB — BASIC METABOLIC PANEL
Anion gap: 8 (ref 5–15)
BUN: 8 mg/dL (ref 6–20)
CO2: 22 mmol/L (ref 22–32)
Calcium: 8.7 mg/dL — ABNORMAL LOW (ref 8.9–10.3)
Chloride: 106 mmol/L (ref 98–111)
Creatinine, Ser: 0.71 mg/dL (ref 0.44–1.00)
GFR calc Af Amer: >60 mL/min (ref >60)
GFR calc non Af Amer: >60 mL/min (ref >60)
Glucose, Bld: 115 mg/dL — ABNORMAL HIGH (ref 70–99)
Potassium: 3.5 mmol/L (ref 3.5–5.1)
Sodium: 136 mmol/L (ref 135–145)

## 2019-05-19 LAB — CBC
HCT: 33.6 % — ABNORMAL LOW (ref 36.0–46.0)
Hemoglobin: 10.5 g/dL — ABNORMAL LOW (ref 12.0–15.0)
MCH: 29.2 pg (ref 26.0–34.0)
MCHC: 31.3 g/dL (ref 30.0–36.0)
MCV: 93.3 fL (ref 80.0–100.0)
Platelets: 275 10*3/uL (ref 150–400)
RBC: 3.6 MIL/uL — ABNORMAL LOW (ref 3.87–5.11)
RDW: 15.5 % (ref 11.5–15.5)
WBC: 6.2 10*3/uL (ref 4.0–10.5)
nRBC: 0 % (ref 0.0–0.2)

## 2019-05-19 LAB — TROPONIN I (HIGH SENSITIVITY)
Troponin I (High Sensitivity): 2 ng/L (ref <18)
Troponin I (High Sensitivity): <2 ng/L (ref <18)

## 2019-05-19 MED ORDER — SODIUM CHLORIDE 0.9% FLUSH
3.0000 mL | Freq: Once | INTRAVENOUS | Status: AC
  Administered 2019-05-19: 22:00:00 3 mL via INTRAVENOUS

## 2019-05-19 MED ORDER — ALBUTEROL SULFATE HFA 108 (90 BASE) MCG/ACT IN AERS
6.0000 | INHALATION_SPRAY | Freq: Once | RESPIRATORY_TRACT | Status: AC
  Administered 2019-05-19: 6 via RESPIRATORY_TRACT
  Filled 2019-05-19: qty 6.7

## 2019-05-19 MED ORDER — METHYLPREDNISOLONE SODIUM SUCC 125 MG IJ SOLR
125.0000 mg | Freq: Once | INTRAMUSCULAR | Status: AC
  Administered 2019-05-19: 125 mg via INTRAVENOUS
  Filled 2019-05-19: qty 2

## 2019-05-19 NOTE — ED Triage Notes (Signed)
Pt states she has been having CP/ cough for 4 days. Coughing up yellow sputum. Pt also complains of blood in her urine and right flank pain for two days. Denies painful urination.

## 2019-05-20 LAB — D-DIMER, QUANTITATIVE: D-Dimer, Quant: <0.27 ug/mL-FEU (ref 0.00–0.50)

## 2019-05-20 MED ORDER — PREDNISONE 10 MG PO TABS: 50.0000 mg | ORAL_TABLET | Freq: Every day | ORAL | 0 refills | Status: AC

## 2019-05-20 MED ORDER — ALBUTEROL SULFATE HFA 108 (90 BASE) MCG/ACT IN AERS: 2.0000 | INHALATION_SPRAY | RESPIRATORY_TRACT | 0 refills | Status: AC | PRN

## 2019-05-20 NOTE — ED Provider Notes (Signed)
[facility] EMERGENCY DEPARTMENT Provider Note   CSN: [id number] Arrival date & time: 05/19/19  1735     History   Chief Complaint Chief Complaint  Patient presents with  . Chest Pain  . Hematuria    HPI Jillian Mcgrath is a 38 y.o. female.     HPI  38 year old comes in with chief complaint of chest pain.  Patient has history of asthma, kidney stones.  She reports that she has been having some chest tightness along with wheezing.  She has taken 3 or 4 treatments at home with transient and minimal relief.  Patient has discomfort with inspiration.  She denies any fevers, chills, sick contacts.  Patient also reports having some hematuria.  She has had multiple kidney stones and suspects that she might have passed a stone or is about to pass a stone.  She has no abdominal pain at this time and denies any vaginal discharge or bleeding.  She has no burning with urination.  Pt has no hx of PE, DVT and denies any exogenous hormone (testosterone / estrogen) use, long distance travels or surgery in the past 6 weeks, active cancer, recent immobilization.   Past Medical History:  Diagnosis Date  . Asthma   . History of kidney stones   . Kidney stone    x 14 lithrotripsies  . PONV (postoperative nausea and vomiting)     There are no active problems to display for this patient.   Past Surgical History:  Procedure Laterality Date  . CESAREAN SECTION     x 4  . CYSTOSCOPY/URETEROSCOPY/HOLMIUM LASER/STENT PLACEMENT Left 01/21/2018   Procedure: CYSTOSCOPY /LEFT URETEROSCOPY/HOLMIUM LASER/LEFT STENT PLACEMENT;  Surgeon: Crista Elliot, MD;  Location: [facility];  Service: Urology;  Laterality: Left;  . LITHOTRIPSY    . TONSILLECTOMY       OB History   No obstetric history on file.      Home Medications    Prior to Admission medications   Medication Sig Start Date End Date Taking? Authorizing Provider  DM-Phenylephrine-Acetaminophen (ALKA-SELTZER PLS SINUS &  COUGH PO) Take 2 tablets by mouth once.   Yes [provider]  albuterol (VENTOLIN HFA) 108 (90 Base) MCG/ACT inhaler Inhale 2 puffs into the lungs every 4 (four) hours as needed for wheezing or shortness of breath. 05/20/19   Derwood Kaplan, MD  meloxicam (MOBIC) 15 MG tablet Take 1 tablet (15 mg total) by mouth daily. Patient not taking: Reported on 05/19/2019 04/20/19   Arthor Captain, PA-C  methocarbamol (ROBAXIN) 500 MG tablet Take 1 tablet (500 mg total) by mouth 2 (two) times daily. Patient not taking: Reported on 05/19/2019 04/20/19   Arthor Captain, PA-C  predniSONE (DELTASONE) 10 MG tablet Take 5 tablets (50 mg total) by mouth daily. 05/20/19   Derwood Kaplan, MD    Family History History reviewed. No pertinent family history.  Social History Social History   Tobacco Use  . Smoking status: Never Smoker  . Smokeless tobacco: Never Used  Substance Use Topics  . Alcohol use: Never    Frequency: Never  . Drug use: Never     Allergies   Demerol [meperidine] and Sulfa antibiotics   Review of Systems Review of Systems  Constitutional: Positive for activity change.  Respiratory: Positive for shortness of breath.   Cardiovascular: Positive for chest pain.  Gastrointestinal: Negative for abdominal distention.  Genitourinary: Positive for hematuria. Negative for dysuria and flank pain.  Hematological: Does not bruise/bleed easily.  All  other systems reviewed and are negative.    Physical Exam Updated Vital Signs BP (!) 107/50   Pulse 85   Temp 98.5 F (36.9 C) (Oral)   Resp 17   Ht 6' (1.829 m)   Wt 72.6 kg   LMP 05/12/2019   SpO2 98%   BMI 21.70 kg/m   Physical Exam Vitals signs and nursing note reviewed.  Constitutional:      Appearance: She is well-developed.  HENT:     Head: Normocephalic and atraumatic.  Neck:     Musculoskeletal: Normal range of motion and neck supple.  Cardiovascular:     Rate and Rhythm: Normal rate.  Pulmonary:      Effort: Pulmonary effort is normal.     Breath sounds: Examination of the right-lower field reveals wheezing. Examination of the left-lower field reveals wheezing. Wheezing present. No decreased breath sounds or rhonchi.  Abdominal:     General: Bowel sounds are normal.  Skin:    General: Skin is warm and dry.  Neurological:     Mental Status: She is alert and oriented to person, place, and time.      ED Treatments / Results  Labs (all labs ordered are listed, but only abnormal results are displayed) Labs Reviewed  BASIC METABOLIC PANEL - Abnormal; Notable for the following components:      Result Value   Glucose, Bld 115 (*)    Calcium 8.7 (*)    All other components within normal limits  CBC - Abnormal; Notable for the following components:   RBC 3.60 (*)    Hemoglobin 10.5 (*)    HCT 33.6 (*)    All other components within normal limits  URINALYSIS, ROUTINE W REFLEX MICROSCOPIC - Abnormal; Notable for the following components:   Color, Urine STRAW (*)    Ketones, ur 5 (*)    All other components within normal limits  D-DIMER, QUANTITATIVE (NOT AT [facility])  I-STAT BETA HCG BLOOD, ED ([facility], [facility], [facility] ONLY)  TROPONIN I (HIGH SENSITIVITY)  TROPONIN I (HIGH SENSITIVITY)    EKG EKG Interpretation  Date/Time:  Monday May 19 2019 17:43:58 EST Ventricular Rate:  98 PR Interval:  158 QRS Duration: 88 QT Interval:  354 QTC Calculation: 451 R Axis:   95 Text Interpretation: Normal sinus rhythm with sinus arrhythmia Rightward axis Borderline ECG No acute changes No significant change since last tracing Confirmed by Varney Biles [phone]) on 05/19/2019 7:47:13 PM   Radiology Dg Chest 2 View  Result Date: 05/19/2019 CLINICAL DATA:  Chest pain and cough over the last 4 days. Productive yellow sputum. Hematuria and right flank pain. EXAM: CHEST - 2 VIEW COMPARISON:  12/16/2018 FINDINGS: The heart size and mediastinal contours are within normal limits. Both lungs are clear. The  visualized skeletal structures are unremarkable. IMPRESSION: No active cardiopulmonary disease. Electronically Signed   By: Van Clines M.D.   On: 05/19/2019 18:27    Procedures Procedures (including critical care time)  Medications Ordered in ED Medications  sodium chloride flush (NS) 0.9 % injection 3 mL (3 mLs Intravenous Given 05/19/19 2156)  albuterol (VENTOLIN HFA) 108 (90 Base) MCG/ACT inhaler 6 puff (6 puffs Inhalation Given 05/19/19 1956)  methylPREDNISolone sodium succinate (SOLU-MEDROL) 125 mg/2 mL injection 125 mg (125 mg Intravenous Given 05/19/19 2152)     Initial Impression / Assessment and Plan / ED Course  I have reviewed the triage vital signs and the nursing notes.  Pertinent labs & imaging results that were available during  my care of the patient were reviewed by me and considered in my medical decision making (see chart for details).        Patient comes in a chief complaint chest pain, shortness of breath. On exam there is minimal wheezing.  She reports pleuritic chest pain and states that she is getting minimal and transient relief with inhalers.  Patient was given nebulizer treatment and reassess.  She still had some chest discomfort.  She is Wells negative and PERC negative, however, given that her exam was pretty benign despite her severe symptoms we decided to get a D-dimer to ensure there is no PE.   D-dimer results have come back and is negative. The patient appears reasonably screened and/or stabilized for discharge and I doubt any other medical condition or other [facility] requiring further screening, evaluation, or treatment in the ED at this time prior to discharge.   Results from the ER workup discussed with the patient face to face and all questions answered to the best of my ability. The patient is safe for discharge with strict return precautions.   Final Clinical Impressions(s) / ED Diagnoses   Final diagnoses:  Moderate persistent asthma with  acute exacerbation    ED Discharge Orders         Ordered    predniSONE (DELTASONE) 10 MG tablet  Daily     05/20/19 0003    albuterol (VENTOLIN HFA) 108 (90 Base) MCG/ACT inhaler  Every 4 hours PRN     05/20/19 0005           Derwood KaplanNanavati, Bethannie Iglehart, MD 05/20/19 0023

## 2019-05-20 NOTE — ED Notes (Signed)
Pt removed all monitoring equipment. 

## 2019-05-20 NOTE — Discharge Instructions (Addendum)
We saw you in the ER for your asthma related complains. We gave you some breathing treatments in the ER, and seems like your symptoms have improved. Please take albuterol as needed every 4 hours. Please take the medications prescribed. Please refrain from smoking or smoke exposure. Please see a primary care doctor in 1 week. Return to the ER if your symptoms worsen.  

## 2019-06-02 ENCOUNTER — Other Ambulatory Visit: Payer: Self-pay

## 2019-06-02 ENCOUNTER — Emergency Department (HOSPITAL_COMMUNITY)
Admission: EM | Admit: 2019-06-02 | Discharge: 2019-06-03 | Discharge status: Against Medical Advice | Payer: Medicaid Other

## 2019-06-03 ENCOUNTER — Emergency Department (HOSPITAL_COMMUNITY): Payer: Medicaid Other

## 2019-06-03 ENCOUNTER — Emergency Department (HOSPITAL_COMMUNITY)
Admission: EM | Admit: 2019-06-03 | Discharge: 2019-06-03 | Disposition: A | Discharge status: Home / Self Care | Payer: Medicaid Other | Attending: Emergency Medicine | Admitting: Emergency Medicine

## 2019-06-03 DIAGNOSIS — J45909 Unspecified asthma, uncomplicated: Secondary | ICD-10-CM | POA: Diagnosis not present

## 2019-06-03 DIAGNOSIS — Z79899 Other long term (current) drug therapy: Secondary | ICD-10-CM | POA: Insufficient documentation

## 2019-06-03 DIAGNOSIS — K5901 Slow transit constipation: Secondary | ICD-10-CM | POA: Diagnosis not present

## 2019-06-03 DIAGNOSIS — R319 Hematuria, unspecified: Secondary | ICD-10-CM | POA: Diagnosis present

## 2019-06-03 LAB — CBC
HCT: 35.2 % — ABNORMAL LOW (ref 36.0–46.0)
Hemoglobin: 11 g/dL — ABNORMAL LOW (ref 12.0–15.0)
MCH: 28.6 pg (ref 26.0–34.0)
MCHC: 31.3 g/dL (ref 30.0–36.0)
MCV: 91.4 fL (ref 80.0–100.0)
Platelets: 267 10*3/uL (ref 150–400)
RBC: 3.85 MIL/uL — ABNORMAL LOW (ref 3.87–5.11)
RDW: 15.4 % (ref 11.5–15.5)
WBC: 6.1 10*3/uL (ref 4.0–10.5)
nRBC: 0 % (ref 0.0–0.2)

## 2019-06-03 LAB — BASIC METABOLIC PANEL
Anion gap: 8 (ref 5–15)
BUN: 9 mg/dL (ref 6–20)
CO2: 23 mmol/L (ref 22–32)
Calcium: 8.9 mg/dL (ref 8.9–10.3)
Chloride: 105 mmol/L (ref 98–111)
Creatinine, Ser: 0.88 mg/dL (ref 0.44–1.00)
GFR calc Af Amer: >60 mL/min (ref >60)
GFR calc non Af Amer: >60 mL/min (ref >60)
Glucose, Bld: 142 mg/dL — ABNORMAL HIGH (ref 70–99)
Potassium: 3.7 mmol/L (ref 3.5–5.1)
Sodium: 136 mmol/L (ref 135–145)

## 2019-06-03 LAB — I-STAT BETA HCG BLOOD, ED (MC, WL, AP ONLY): I-stat hCG, quantitative: <5 m[IU]/mL (ref <5)

## 2019-06-03 MED ORDER — POLYETHYLENE GLYCOL 3350 17 G PO PACK: 17.0000 g | PACK | Freq: Every day | ORAL | 0 refills | Status: AC

## 2019-06-03 NOTE — ED Triage Notes (Signed)
Pt here for hematuria and bilateral flank pain x 3 days. Pt has hx of kidney stones and sts this feels the same.

## 2019-06-03 NOTE — ED Provider Notes (Signed)
[facility] EMERGENCY DEPARTMENT Provider Note   CSN: [id number] Arrival date & time: 06/03/19  1335     History   Chief Complaint Chief Complaint  Patient presents with   Hematuria    HPI Jillian Mcgrath is a 38 y.o. female with a past medical history of kidney stones presenting to the ED with chief complaint of hematuria and bilateral flank pain for the past 3 days.  States the pain as sharp, no specific aggravating or alleviating factor.  States that this feels similar to the time she was diagnosed with kidney stones in the past.  She has not tried any medications to help with her symptoms.  She denies any dysuria, fever, abdominal pain, vaginal complaints, shortness of breath.     HPI  Past Medical History:  Diagnosis Date   Asthma    History of kidney stones    Kidney stone    x 14 lithrotripsies   PONV (postoperative nausea and vomiting)     There are no active problems to display for this patient.   Past Surgical History:  Procedure Laterality Date   CESAREAN SECTION     x 4   CYSTOSCOPY/URETEROSCOPY/HOLMIUM LASER/STENT PLACEMENT Left 01/21/2018   Procedure: CYSTOSCOPY /LEFT URETEROSCOPY/HOLMIUM LASER/LEFT STENT PLACEMENT;  Surgeon: Crista Elliot, MD;  Location: [facility];  Service: Urology;  Laterality: Left;   LITHOTRIPSY     TONSILLECTOMY       OB History   No obstetric history on file.      Home Medications    Prior to Admission medications   Medication Sig Start Date End Date Taking? Authorizing Provider  albuterol (VENTOLIN HFA) 108 (90 Base) MCG/ACT inhaler Inhale 2 puffs into the lungs every 4 (four) hours as needed for wheezing or shortness of breath. 05/20/19   Derwood Kaplan, MD  DM-Phenylephrine-Acetaminophen (ALKA-SELTZER PLS SINUS & COUGH PO) Take 2 tablets by mouth once.    [provider]  meloxicam (MOBIC) 15 MG tablet Take 1 tablet (15 mg total) by mouth daily. Patient not taking: Reported on  05/19/2019 04/20/19   Arthor Captain, PA-C  methocarbamol (ROBAXIN) 500 MG tablet Take 1 tablet (500 mg total) by mouth 2 (two) times daily. Patient not taking: Reported on 05/19/2019 04/20/19   Arthor Captain, PA-C  polyethylene glycol (MIRALAX / GLYCOLAX) 17 g packet Take 17 g by mouth daily. 06/03/19   Bridgitt Raggio, PA-C  predniSONE (DELTASONE) 10 MG tablet Take 5 tablets (50 mg total) by mouth daily. 05/20/19   Derwood Kaplan, MD    Family History No family history on file.  Social History Social History   Tobacco Use   Smoking status: Never Smoker   Smokeless tobacco: Never Used  Substance Use Topics   Alcohol use: Never    Frequency: Never   Drug use: Never     Allergies   Demerol [meperidine] and Sulfa antibiotics   Review of Systems Review of Systems  Constitutional: Negative for appetite change, chills and fever.  HENT: Negative for ear pain, rhinorrhea, sneezing and sore throat.   Eyes: Negative for photophobia and visual disturbance.  Respiratory: Negative for cough, chest tightness, shortness of breath and wheezing.   Cardiovascular: Negative for chest pain and palpitations.  Gastrointestinal: Negative for abdominal pain, blood in stool, constipation, diarrhea, nausea and vomiting.  Genitourinary: Positive for flank pain and hematuria. Negative for dysuria and urgency.  Musculoskeletal: Negative for myalgias.  Skin: Negative for rash.  Neurological: Negative for dizziness, weakness and  light-headedness.     Physical Exam Updated Vital Signs BP (!) 106/47 (BP Location: Right Arm)    Pulse 87    Temp 98.7 F (37.1 C) (Oral)    Resp 16    LMP 05/12/2019    SpO2 100%   Physical Exam Vitals signs and nursing note reviewed.  Constitutional:      General: She is not in acute distress.    Appearance: She is well-developed.  HENT:     Head: Normocephalic and atraumatic.     Nose: Nose normal.  Eyes:     General: No scleral icterus.       Left eye: No  discharge.     Conjunctiva/sclera: Conjunctivae normal.  Neck:     Musculoskeletal: Normal range of motion and neck supple.  Cardiovascular:     Rate and Rhythm: Normal rate and regular rhythm.     Heart sounds: Normal heart sounds. No murmur. No friction rub. No gallop.   Pulmonary:     Effort: Pulmonary effort is normal. No respiratory distress.     Breath sounds: Normal breath sounds.  Abdominal:     General: Bowel sounds are normal. There is no distension.     Palpations: Abdomen is soft.     Tenderness: There is no abdominal tenderness. There is no guarding.  Musculoskeletal: Normal range of motion.  Skin:    General: Skin is warm and dry.     Findings: No rash.  Neurological:     Mental Status: She is alert.     Motor: No abnormal muscle tone.     Coordination: Coordination normal.      ED Treatments / Results  Labs (all labs ordered are listed, but only abnormal results are displayed) Labs Reviewed  CBC - Abnormal; Notable for the following components:      Result Value   RBC 3.85 (*)    Hemoglobin 11.0 (*)    HCT 35.2 (*)    All other components within normal limits  BASIC METABOLIC PANEL - Abnormal; Notable for the following components:   Glucose, Bld 142 (*)    All other components within normal limits  URINALYSIS, ROUTINE W REFLEX MICROSCOPIC  I-STAT BETA HCG BLOOD, ED ([facility], [facility], [facility] ONLY)    EKG None  Radiology Ct Renal Stone Study  Result Date: 06/03/2019 CLINICAL DATA:  Flank pain. Evaluate for kidney stone. EXAM: CT ABDOMEN AND PELVIS WITHOUT CONTRAST TECHNIQUE: Multidetector CT imaging of the abdomen and pelvis was performed following the standard protocol without IV contrast. COMPARISON:  11/29/2018 FINDINGS: Lower chest: No acute abnormality. Hepatobiliary: No focal liver abnormality is seen. No gallstones, gallbladder wall thickening, or biliary dilatation. Pancreas: Unremarkable. No pancreatic ductal dilatation or surrounding inflammatory changes.  Spleen: Normal in size without focal abnormality. Adrenals/Urinary Tract: Normal appearance of the adrenal glands. 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 Multiple bilateral renal calculi noted. Largest stone within the right kidney measures 6 mm and is in the inferior pole. The largest stone within the right upper pole measures 6 mm. In the left kidney stones within the upper and lower pole measure up to 3 mm. No hydronephrosis identified bilaterally. No hydroureter or ureteral calculi. Urinary bladder appears normal. Stomach/Bowel: Stomach is within normal limits. Appendix appears normal. No evidence of bowel wall thickening, distention, or inflammatory changes. Moderate stool burden noted throughout the colon. Vascular/Lymphatic: No significant vascular findings are present. No enlarged abdominal or pelvic lymph nodes. Reproductive: Uterus and bilateral adnexa are unremarkable. Other: No  abdominal wall hernia or abnormality. No abdominopelvic ascites. Musculoskeletal: No acute or significant osseous findings. IMPRESSION: 1. No acute findings identified within the abdomen or pelvis. 2. Bilateral medullary nephrocalcinosis and nephrolithiasis 3. No hydronephrosis or ureteral calculi identified. 4. Moderate stool burden noted throughout the colon. Correlate for any clinical signs or symptoms of constipation. Electronically Signed   By: Kerby Moors M.D.   On: 06/03/2019 17:05    Procedures Procedures (including critical care time)  Medications Ordered in ED Medications - No data to display   Initial Impression / Assessment and Plan / ED Course  I have reviewed the triage vital signs and the nursing notes.  Pertinent labs & imaging results that were available during my care of the patient were reviewed by me and considered in my medical decision making (see chart for details).  Clinical Course as of Jun 02 1804  Tue Jun 03, 2019  1749 Spoke to lab regarding urine sample.  This  appears to have been collected but is not in process.  They state that they do not have the as actual urine sample and asked that we recollect.   [HK]  [id number] Patient refuses to give another urine sample.   [HK]    Clinical Course User Index [HK] Delia Heady, PA-C       38 year old female presenting to the ED for bilateral flank pain and hematuria.  She has a history of kidney stones and states that this feels similar.  On exam patient is overall well-appearing.  Abdomen is soft, nontender nondistended.  Lab work including CBC, BMP, hCG unremarkable.  CT renal stone study with no ureteral stone, does show several nonobstructing stones and a large amount of stool.  I feel that this is the most likely cause of her pain. No UA on today's visit.  We will have her take MiraLAX and continue other home medications as previously prescribed.  Advised her to return for worsening symptoms.  Patient is hemodynamically stable, in NAD, and able to ambulate in the ED. Evaluation does not show pathology that would require ongoing emergent intervention or inpatient treatment. I explained the diagnosis to the patient. Pain has been managed and has no complaints prior to discharge. Patient is comfortable with above plan and is stable for discharge at this time. All questions were answered prior to disposition. Strict return precautions for returning to the ED were discussed. Encouraged follow up with PCP.   An After Visit Summary was printed and given to the patient.   Portions of this note were generated with [employer]. Dictation errors may occur despite best attempts at proofreading.   Final Clinical Impressions(s) / ED Diagnoses   Final diagnoses:  Slow transit constipation    ED Discharge Orders         Ordered    polyethylene glycol (MIRALAX / GLYCOLAX) 17 g packet  Daily     06/03/19 1803           Delia Heady, PA-C 06/03/19 1805    Drenda Freeze, MD 06/03/19 [phone]

## 2019-06-03 NOTE — ED Notes (Signed)
Sitting outside-- please call outside

## 2019-06-03 NOTE — ED Notes (Signed)
Pt left before discharge instructions were given.

## 2019-06-03 NOTE — Discharge Instructions (Signed)
Take MiraLAX as directed. Follow-up with your primary care provider. Return to the ED if you start to develop worsening symptoms, fever, shortness of breath, blood in your stool.

## 2019-06-13 ENCOUNTER — Inpatient Hospital Stay
Admit: 2019-06-13 | Discharge: 2019-06-14 | Disposition: A | Discharge status: Home / Self Care | Payer: MEDICAID | Attending: Emergency Medicine

## 2019-06-13 ENCOUNTER — Emergency Department: Admit: 2019-06-13 | Payer: MEDICAID | Primary: Family Medicine

## 2019-06-13 DIAGNOSIS — R399 Unspecified symptoms and signs involving the genitourinary system: Secondary | ICD-10-CM

## 2019-06-13 LAB — CBC WITH AUTO DIFFERENTIAL
Basophils %: 0 % (ref 0.0–2.0)
Basophils Absolute: 0 10*3/uL (ref 0.0–0.2)
Eosinophils %: 1 % (ref 0.5–7.8)
Eosinophils Absolute: 0.1 10*3/uL (ref 0.0–0.8)
Granulocyte Absolute Count: 0 10*3/uL (ref 0.0–0.5)
Hematocrit: 34.3 % — ABNORMAL LOW (ref 35.8–46.3)
Hemoglobin: 10.8 g/dL — ABNORMAL LOW (ref 11.7–15.4)
Immature Granulocytes: 0 % (ref 0.0–5.0)
Lymphocytes %: 46 % — ABNORMAL HIGH (ref 13–44)
Lymphocytes Absolute: 2.3 10*3/uL (ref 0.5–4.6)
MCH: 28.8 PG (ref 26.1–32.9)
MCHC: 31.5 g/dL (ref 31.4–35.0)
MCV: 91.5 FL (ref 79.6–97.8)
MPV: 9.3 FL — ABNORMAL LOW (ref 9.4–12.3)
Monocytes %: 7 % (ref 4.0–12.0)
Monocytes Absolute: 0.4 10*3/uL (ref 0.1–1.3)
NRBC Absolute: 0 10*3/uL (ref 0.0–0.2)
Neutrophils %: 46 % (ref 43–78)
Neutrophils Absolute: 2.3 10*3/uL (ref 1.7–8.2)
Platelets: 256 10*3/uL (ref 150–450)
RBC: 3.75 M/uL — ABNORMAL LOW (ref 4.05–5.2)
RDW: 15.4 % — ABNORMAL HIGH (ref 11.9–14.6)
WBC: 5.1 10*3/uL (ref 4.3–11.1)

## 2019-06-13 LAB — BASIC METABOLIC PANEL
Anion Gap: 7 mmol/L (ref 7–16)
BUN: 12 MG/DL (ref 6–23)
CO2: 26 mmol/L (ref 21–32)
Calcium: 8.6 MG/DL (ref 8.3–10.4)
Chloride: 108 mmol/L — ABNORMAL HIGH (ref 98–107)
Creatinine: 0.84 MG/DL (ref 0.6–1.0)
EGFR IF NonAfrican American: >60 mL/min/{1.73_m2} (ref >60)
GFR African American: >60 mL/min/{1.73_m2} (ref >60)
Glucose: 95 mg/dL (ref 65–100)
Potassium: 3.5 mmol/L (ref 3.5–5.1)
Sodium: 141 mmol/L (ref 136–145)

## 2019-06-13 LAB — TROPONIN, HIGH SENSITIVITY: Troponin, High Sensitivity: 3.6 pg/mL (ref 0–14)

## 2019-06-13 LAB — METABOLIC PANEL, BASIC
Anion gap: 7 mmol/L (ref 7–16)
BUN: 12 MG/DL (ref 6–23)
CO2: 26 mmol/L (ref 21–32)
Calcium: 8.6 MG/DL (ref 8.3–10.4)
Chloride: 108 mmol/L — ABNORMAL HIGH (ref 98–107)
Creatinine: 0.84 MG/DL (ref 0.6–1.0)
GFR est AA: >60 mL/min/{1.73_m2} (ref >60)
GFR est non-AA: >60 mL/min/{1.73_m2} (ref >60)
Glucose: 95 mg/dL (ref 65–100)
Potassium: 3.5 mmol/L (ref 3.5–5.1)
Sodium: 141 mmol/L (ref 136–145)

## 2019-06-13 LAB — CBC WITH AUTOMATED DIFF
ABS. BASOPHILS: 0 10*3/uL (ref 0.0–0.2)
ABS. EOSINOPHILS: 0.1 10*3/uL (ref 0.0–0.8)
ABS. IMM. GRANS.: 0 10*3/uL (ref 0.0–0.5)
ABS. LYMPHOCYTES: 2.3 10*3/uL (ref 0.5–4.6)
ABS. MONOCYTES: 0.4 10*3/uL (ref 0.1–1.3)
ABS. NEUTROPHILS: 2.3 10*3/uL (ref 1.7–8.2)
ABSOLUTE NRBC: 0 10*3/uL (ref 0.0–0.2)
BASOPHILS: 0 % (ref 0.0–2.0)
EOSINOPHILS: 1 % (ref 0.5–7.8)
HCT: 34.3 % — ABNORMAL LOW (ref 35.8–46.3)
HGB: 10.8 g/dL — ABNORMAL LOW (ref 11.7–15.4)
IMMATURE GRANULOCYTES: 0 % (ref 0.0–5.0)
LYMPHOCYTES: 46 % — ABNORMAL HIGH (ref 13–44)
MCH: 28.8 PG (ref 26.1–32.9)
MCHC: 31.5 g/dL (ref 31.4–35.0)
MCV: 91.5 FL (ref 79.6–97.8)
MONOCYTES: 7 % (ref 4.0–12.0)
MPV: 9.3 FL — ABNORMAL LOW (ref 9.4–12.3)
NEUTROPHILS: 46 % (ref 43–78)
PLATELET: 256 10*3/uL (ref 150–450)
RBC: 3.75 M/uL — ABNORMAL LOW (ref 4.05–5.2)
RDW: 15.4 % — ABNORMAL HIGH (ref 11.9–14.6)
WBC: 5.1 10*3/uL (ref 4.3–11.1)

## 2019-06-13 LAB — TROPONIN-HIGH SENSITIVITY: Troponin-High Sensitivity: 3.6 pg/mL (ref 0–14)

## 2019-06-13 MED ORDER — PHENAZOPYRIDINE 200 MG TAB
200 mg | ORAL_TABLET | Freq: Three times a day (TID) | ORAL | 0 refills | Status: AC | PRN
Start: 2019-06-13 — End: 2019-06-18

## 2019-06-13 MED ORDER — KETOROLAC TROMETHAMINE 30 MG/ML INJECTION
30 mg/mL (1 mL) | Freq: Once | INTRAMUSCULAR | Status: DC
Start: 2019-06-13 — End: 2019-06-13

## 2019-06-13 MED ORDER — PREDNISONE 10 MG TAB
10 mg | ORAL | Status: AC
Start: 2019-06-13 — End: 2019-06-13
  Administered 2019-06-13: 23:00:00 via ORAL

## 2019-06-13 MED ORDER — NAPROXEN 500 MG EC TAB, DELAYED RELEASE
500 mg | ORAL_TABLET | Freq: Two times a day (BID) | ORAL | 0 refills | Status: AC | PRN
Start: 2019-06-13 — End: ?

## 2019-06-13 MED ORDER — KETOROLAC TROMETHAMINE 30 MG/ML INJECTION
30 mg/mL (1 mL) | Freq: Once | INTRAMUSCULAR | Status: AC
Start: 2019-06-13 — End: 2019-06-13
  Administered 2019-06-13: 23:00:00 via INTRAVENOUS

## 2019-06-13 MED FILL — PREDNISONE 10 MG TAB: 10 mg | ORAL | Qty: 1

## 2019-06-13 MED FILL — KETOROLAC TROMETHAMINE 30 MG/ML INJECTION: 30 mg/mL (1 mL) | INTRAMUSCULAR | Qty: 1

## 2019-06-13 NOTE — ED Notes (Signed)
Patient with complaining of lower back pain with blood in urine starting about 3 weeks ago. Patient advises odor to urine. Patient advises history of kidney stones and multiple lithotripsy in past. Patient advises also a history of asthma and feels tight in chest and requesting a steroid.

## 2019-06-13 NOTE — ED Notes (Signed)
I have reviewed discharge instructions with the patient.  The patient verbalized understanding.    Patient left ED via Discharge Method: ambulatory to Home with herself.    Opportunity for questions and clarification provided.       Patient given 2 scripts.         To continue your aftercare when you leave the hospital, you may receive an automated call from our care team to check in on how you are doing.  This is a free service and part of our promise to provide the best care and service to meet your aftercare needs." If you have questions, or wish to unsubscribe from this service please call [phone].  Thank you for Choosing our [facility].

## 2019-06-13 NOTE — ED Provider Notes (Signed)
[name]       HPI:    38 year old female history nephrolithiasis presents the emergency department with complaint of hematuria for the past 3 weeks.  She states she has an odor to her urine.  She states she has multiple kidney stones multiple lithotripsies in the past.  Patient also states she has a history of asthma and is feeling some chest discomfort and is requesting a steroid.  No known history of CAD CHF VHD or cardiac arrhythmias.    Patient denies any surgery requiring general anesthesia, travel, or significant immobilization in the past 4 weeks.  Pt denies hemoptysis.  Pt denies asymmetric lower extremity edema.  Pt denies known history of malignancy.  Denies prior history of venous thromboembolism or known hypercoagulable disorder.  Denies use of estrogen containing medications.    Denies abdominal pain nausea vomiting.  Denies abnormal vaginal bleeding or discharge.    REVIEW OF SYSTEMS     ROS Negative Except as Listed in HPI    PAST MEDICAL HISTORY     Past Medical History:   Diagnosis Date   ??? Anemia of pregnancy 05/09/2008   ??? Asthma    ??? Chronic kidney disease     kidney stones   ??? General conditions of the kidney and ureter     kidney stones   ??? General conditions of the kidney and ureter     hydronephrosis   ??? GERD (gastroesophageal reflux disease)     mild   ??? Postpartum fever 05/09/2008   ??? Previous cesarean delivery, delivered 05/09/2008     Past Surgical History:   Procedure Laterality Date   ??? CESAREAN DELIVERY ONLY  2001, 2006, 2009   ??? HX CESAREAN SECTION      x4   ??? HX LITHOTRIPSY      x5   ??? HX OTHER SURGICAL  201, 2006    lithotripsy x2   ??? HX OTHER SURGICAL  10/11/09    kidney-stent   ??? HX OTHER SURGICAL      c-section   ??? HX TONSILLECTOMY     ??? HX TUBAL LIGATION      Nov 17, 2008     Social History     Socioeconomic History   ??? Marital status: SINGLE     Spouse name: Not on file   ??? Number of children: Not on file   ??? Years of education: Not on  file   ??? Highest education level: Not on file   Tobacco Use   ??? Smoking status: Never Smoker   ??? Smokeless tobacco: Never Used   Substance and Sexual Activity   ??? Alcohol use: No   ??? Drug use: No   ??? Sexual activity: Yes     Partners: Male     Birth control/protection: Surgical     Prior to Admission Medications   Prescriptions Last Dose Informant Patient Reported? Taking?   albuterol (PROAIR HFA) 90 mcg/actuation inhaler   No No   Sig: Take 2 Puffs by inhalation every six (6) hours as needed for Wheezing.   amoxicillin-clavulanate (AUGMENTIN) 875-125 mg per tablet   No No   Sig: Take 1 Tab by mouth two (2) times a day.   ondansetron hcl (ZOFRAN) 8 mg tablet   No No   Sig: Take 1 Tab by mouth every eight (8) hours as needed for Nausea.      Facility-Administered Medications: None     Allergies  Allergen Reactions   ??? Meperidine Hives     Demerol          PHYSICAL EXAM       Vitals:    06/13/19 1538   BP: 116/62   Pulse: 94   Resp: 16   Temp: 98.6 ??F (37 ??C)   SpO2: 100%    Vital signs were reviewed.     Physical Exam  Vitals signs and nursing note reviewed.   Constitutional:       General: She is not in acute distress.     Appearance: She is not ill-appearing, toxic-appearing or diaphoretic.   HENT:      Head: Atraumatic.      Mouth/Throat:      Mouth: Mucous membranes are moist.   Eyes:      General: No scleral icterus.  Cardiovascular:      Rate and Rhythm: Normal rate and regular rhythm.   Pulmonary:      Effort: Pulmonary effort is normal.      Breath sounds: Normal breath sounds.   Abdominal:      Palpations: Abdomen is soft.      Tenderness: There is no abdominal tenderness. There is no right CVA tenderness or left CVA tenderness.   Musculoskeletal:      Comments: No lower extremity edema.  No palpable cords in lower extremities.   Skin:     General: Skin is warm and dry.   Neurological:      Comments: Awake, alert. GCS 15. CN II-XII grossly intact. Speech clear. No gross lateralizing neuro deficits.     Psychiatric:         Behavior: Behavior normal.          MEDICAL DECISION MAKING       Initial Impression and Treatment Plan  Afebrile nontoxic 38 year old female presenting to the ED with hematuria for the past few weeks and reported chest discomfort.  She states the chest discomfort is typical for her asthma.  She is a fairly clear lung exam overall so she is getting a chest x-ray and EKG.  She is PERC rule negative.  She appears very comfortable without typical appearance of ureteral colic.  She has not had any nausea or vomiting.  We will check some basic labs treat symptomatically get chest x-ray EKG and reassess.    EKG performed at 1746.  Normal sinus rhythm at 76.  Borderline right axis deviation.  No ST elevations or depressions.  No significant T wave abnormality.  Intervals normal.    Urine dip POC: Negative LE, negative nitrite, negative blood negative ketones negative glucose negative protein    Urine hCG POC negative      Recent Results (from the past 8 hour(s))   CBC WITH AUTOMATED DIFF    Collection Time: 06/13/19  5:53 PM   Result Value Ref Range    WBC 5.1 4.3 - 11.1 K/uL    RBC 3.75 (L) 4.05 - 5.2 M/uL    HGB 10.8 (L) 11.7 - 15.4 g/dL    HCT 16.134.3 (L) 09.635.8 - 46.3 %    MCV 91.5 79.6 - 97.8 FL    MCH 28.8 26.1 - 32.9 PG    MCHC 31.5 31.4 - 35.0 g/dL    RDW 04.515.4 (H) 40.911.9 - 14.6 %    PLATELET 256 150 - 450 K/uL    MPV 9.3 (L) 9.4 - 12.3 FL    ABSOLUTE NRBC 0.00 0.0 - 0.2 K/uL  DF AUTOMATED      NEUTROPHILS 46 43 - 78 %    LYMPHOCYTES 46 (H) 13 - 44 %    MONOCYTES 7 4.0 - 12.0 %    EOSINOPHILS 1 0.5 - 7.8 %    BASOPHILS 0 0.0 - 2.0 %    IMMATURE GRANULOCYTES 0 0.0 - 5.0 %    ABS. NEUTROPHILS 2.3 1.7 - 8.2 K/UL    ABS. LYMPHOCYTES 2.3 0.5 - 4.6 K/UL    ABS. MONOCYTES 0.4 0.1 - 1.3 K/UL    ABS. EOSINOPHILS 0.1 0.0 - 0.8 K/UL    ABS. BASOPHILS 0.0 0.0 - 0.2 K/UL    ABS. IMM. GRANS. 0.0 0.0 - 0.5 K/UL   METABOLIC PANEL, BASIC    Collection Time: 06/13/19  5:53 PM   Result Value Ref Range    Sodium 141  136 - 145 mmol/L    Potassium 3.5 3.5 - 5.1 mmol/L    Chloride 108 (H) 98 - 107 mmol/L    CO2 26 21 - 32 mmol/L    Anion gap 7 7 - 16 mmol/L    Glucose 95 65 - 100 mg/dL    BUN 12 6 - 23 MG/DL    Creatinine 2.67 0.6 - 1.0 MG/DL    GFR est AA >12 >45 YK/DXI/3.38S5    GFR est non-AA >60 >60 ml/min/1.64m2    Calcium 8.6 8.3 - 10.4 MG/DL   TROPONIN-HIGH SENSITIVITY    Collection Time: 06/13/19  5:53 PM   Result Value Ref Range    Troponin-High Sensitivity 3.6 0 - 14 pg/mL         Xr Chest Port    Result Date: 06/13/2019  Chest X-ray INDICATION: Chest pain A portable AP view of the chest was obtained. FINDINGS: The lungs are clear. There are no infiltrates or effusions.  The heart size is normal.  The bony thorax is intact.      IMPRESSION: No acute findings in the chest         Medications   predniSONE (DELTASONE) tablet 60 mg (60 mg Oral Given 06/13/19 1803)   ketorolac (TORADOL) injection 30 mg (30 mg IntraVENous Given 06/13/19 1803)           Procedures        Disposition:    DC.  Discussed results.  Answered questions.  No evidence of hematuria or UTI.  Benign labs.  EKG troponin negative.  Chest x-ray benign.  Follow-up with PCP.    Diagnosis:     ICD-10-CM ICD-9-CM   1. Urinary symptom or sign  R39.9 788.99   2. Chest pain, unspecified type  R07.9 786.50         __________________________________________________________      Please note, this chart was dictated using Dragon dictation, voice recognition software.  While efforts were made to correct any transcription errors, some may inadvertently remain.  Please forgive punctuation and typographic/voice recognition errors.  Please contact me with any questions concerns or for clarification of documentation.

## 2019-06-13 NOTE — ED Provider Notes (Signed)
[facility]       HPI:    38 year old female history nephrolithiasis presents the emergency department with complaint of hematuria for the past 3 weeks.  She states she has an odor to her urine.  She states she has multiple kidney stones multiple lithotripsies in the past.  Patient also states she has a history of asthma and is feeling some chest discomfort and is requesting a steroid.  No known history of CAD CHF VHD or cardiac arrhythmias.    Patient denies any surgery requiring general anesthesia, travel, or significant immobilization in the past 4 weeks.  Pt denies hemoptysis.  Pt denies asymmetric lower extremity edema.  Pt denies known history of malignancy.  Denies prior history of venous thromboembolism or known hypercoagulable disorder.  Denies use of estrogen containing medications.    Denies abdominal pain nausea vomiting.  Denies abnormal vaginal bleeding or discharge.    REVIEW OF SYSTEMS     ROS Negative Except as Listed in HPI    PAST MEDICAL HISTORY     Past Medical History:   Diagnosis Date   ??? Anemia of pregnancy 05/09/2008   ??? Asthma    ??? Chronic kidney disease     kidney stones   ??? General conditions of the kidney and ureter     kidney stones   ??? General conditions of the kidney and ureter     hydronephrosis   ??? GERD (gastroesophageal reflux disease)     mild   ??? Postpartum fever 05/09/2008   ??? Previous cesarean delivery, delivered 05/09/2008     Past Surgical History:   Procedure Laterality Date   ??? CESAREAN DELIVERY ONLY  2001, 2006, 2009   ??? HX CESAREAN SECTION      x4   ??? HX LITHOTRIPSY      x5   ??? HX OTHER SURGICAL  201, 2006    lithotripsy x2   ??? HX OTHER SURGICAL  10/11/09    kidney-stent   ??? HX OTHER SURGICAL      c-section   ??? HX TONSILLECTOMY     ??? HX TUBAL LIGATION      Nov 17, 2008     Social History     Socioeconomic History   ??? Marital status: SINGLE     Spouse name: Not on file   ??? Number of children: Not on file    ??? Years of education: Not on file   ??? Highest education level: Not on file   Tobacco Use   ??? Smoking status: Never Smoker   ??? Smokeless tobacco: Never Used   Substance and Sexual Activity   ??? Alcohol use: No   ??? Drug use: No   ??? Sexual activity: Yes     Partners: Male     Birth control/protection: Surgical     Prior to Admission Medications   Prescriptions Last Dose Informant Patient Reported? Taking?   albuterol (PROAIR HFA) 90 mcg/actuation inhaler   No No   Sig: Take 2 Puffs by inhalation every six (6) hours as needed for Wheezing.   amoxicillin-clavulanate (AUGMENTIN) 875-125 mg per tablet   No No   Sig: Take 1 Tab by mouth two (2) times a day.   ondansetron hcl (ZOFRAN) 8 mg tablet   No No   Sig: Take 1 Tab by mouth every eight (8) hours as needed for Nausea.      Facility-Administered Medications: None     Allergies  Allergen Reactions   ??? Meperidine Hives     Demerol          PHYSICAL EXAM       Vitals:    06/13/19 1538   BP: 116/62   Pulse: 94   Resp: 16   Temp: 98.6 ??F (37 ??C)   SpO2: 100%    Vital signs were reviewed.     Physical Exam  Vitals signs and nursing note reviewed.   Constitutional:       General: She is not in acute distress.     Appearance: She is not ill-appearing, toxic-appearing or diaphoretic.   HENT:      Head: Atraumatic.      Mouth/Throat:      Mouth: Mucous membranes are moist.   Eyes:      General: No scleral icterus.  Cardiovascular:      Rate and Rhythm: Normal rate and regular rhythm.   Pulmonary:      Effort: Pulmonary effort is normal.      Breath sounds: Normal breath sounds.   Abdominal:      Palpations: Abdomen is soft.      Tenderness: There is no abdominal tenderness. There is no right CVA tenderness or left CVA tenderness.   Musculoskeletal:      Comments: No lower extremity edema.  No palpable cords in lower extremities.   Skin:     General: Skin is warm and dry.   Neurological:      Comments: Awake, alert. GCS 15. CN II-XII grossly intact. Speech clear.  No gross lateralizing neuro deficits.    Psychiatric:         Behavior: Behavior normal.          MEDICAL DECISION MAKING       Initial Impression and Treatment Plan  Afebrile nontoxic 38 year old female presenting to the ED with hematuria for the past few weeks and reported chest discomfort.  She states the chest discomfort is typical for her asthma.  She is a fairly clear lung exam overall so she is getting a chest x-ray and EKG.  She is PERC rule negative.  She appears very comfortable without typical appearance of ureteral colic.  She has not had any nausea or vomiting.  We will check some basic labs treat symptomatically get chest x-ray EKG and reassess.    EKG performed at 1746.  Normal sinus rhythm at 76.  Borderline right axis deviation.  No ST elevations or depressions.  No significant T wave abnormality.  Intervals normal.    Urine dip POC: Negative LE, negative nitrite, negative blood negative ketones negative glucose negative protein    Urine hCG POC negative      Recent Results (from the past 8 hour(s))   CBC WITH AUTOMATED DIFF    Collection Time: 06/13/19  5:53 PM   Result Value Ref Range    WBC 5.1 4.3 - 11.1 K/uL    RBC 3.75 (L) 4.05 - 5.2 M/uL    HGB 10.8 (L) 11.7 - 15.4 g/dL    HCT 34.3 (L) 35.8 - 46.3 %    MCV 91.5 79.6 - 97.8 FL    MCH 28.8 26.1 - 32.9 PG    MCHC 31.5 31.4 - 35.0 g/dL    RDW 15.4 (H) 11.9 - 14.6 %    PLATELET 256 150 - 450 K/uL    MPV 9.3 (L) 9.4 - 12.3 FL    ABSOLUTE NRBC 0.00 0.0 - 0.2 K/uL  DF AUTOMATED      NEUTROPHILS 46 43 - 78 %    LYMPHOCYTES 46 (H) 13 - 44 %    MONOCYTES 7 4.0 - 12.0 %    EOSINOPHILS 1 0.5 - 7.8 %    BASOPHILS 0 0.0 - 2.0 %    IMMATURE GRANULOCYTES 0 0.0 - 5.0 %    ABS. NEUTROPHILS 2.3 1.7 - 8.2 K/UL    ABS. LYMPHOCYTES 2.3 0.5 - 4.6 K/UL    ABS. MONOCYTES 0.4 0.1 - 1.3 K/UL    ABS. EOSINOPHILS 0.1 0.0 - 0.8 K/UL    ABS. BASOPHILS 0.0 0.0 - 0.2 K/UL    ABS. IMM. GRANS. 0.0 0.0 - 0.5 K/UL   METABOLIC PANEL, BASIC    Collection Time: 06/13/19  5:53 PM    Result Value Ref Range    Sodium 141 136 - 145 mmol/L    Potassium 3.5 3.5 - 5.1 mmol/L    Chloride 108 (H) 98 - 107 mmol/L    CO2 26 21 - 32 mmol/L    Anion gap 7 7 - 16 mmol/L    Glucose 95 65 - 100 mg/dL    BUN 12 6 - 23 MG/DL    Creatinine 3.22 0.6 - 1.0 MG/DL    GFR est AA >02 >54 YH/CWC/3.76E8    GFR est non-AA >60 >60 ml/min/1.68m2    Calcium 8.6 8.3 - 10.4 MG/DL   TROPONIN-HIGH SENSITIVITY    Collection Time: 06/13/19  5:53 PM   Result Value Ref Range    Troponin-High Sensitivity 3.6 0 - 14 pg/mL         Xr Chest Port    Result Date: 06/13/2019  Chest X-ray INDICATION: Chest pain A portable AP view of the chest was obtained. FINDINGS: The lungs are clear. There are no infiltrates or effusions.  The heart size is normal.  The bony thorax is intact.      IMPRESSION: No acute findings in the chest         Medications   predniSONE (DELTASONE) tablet 60 mg (60 mg Oral Given 06/13/19 1803)   ketorolac (TORADOL) injection 30 mg (30 mg IntraVENous Given 06/13/19 1803)           Procedures        Disposition:    DC.  Discussed results.  Answered questions.  No evidence of hematuria or UTI.  Benign labs.  EKG troponin negative.  Chest x-ray benign.  Follow-up with PCP.    Diagnosis:     ICD-10-CM ICD-9-CM   1. Urinary symptom or sign  R39.9 788.99   2. Chest pain, unspecified type  R07.9 786.50         __________________________________________________________      Please note, this chart was dictated using Dragon dictation, voice recognition software.  While efforts were made to correct any transcription errors, some may inadvertently remain.  Please forgive punctuation and typographic/voice recognition errors.  Please contact me with any questions concerns or for clarification of documentation.

## 2019-06-13 NOTE — ED Notes (Signed)
I have reviewed discharge instructions with the patient.  The patient verbalized understanding.    Patient left ED via Discharge Method: ambulatory to Home with herself.    Opportunity for questions and clarification provided.       Patient given 2 scripts.         To continue your aftercare when you leave the hospital, you may receive an automated call from our care team to check in on how you are doing.  This is a free service and part of our promise to provide the best care and service to meet your aftercare needs.??? If you have questions, or wish to unsubscribe from this service please call [phone].  Thank you for Choosing our [facility].

## 2019-06-13 NOTE — ED Triage Notes (Signed)
Patient with complaining of lower back pain with blood in urine starting about 3 weeks ago. Patient advises odor to urine. Patient advises history of kidney stones and multiple lithotripsy in past. Patient advises also a history of asthma and feels tight in chest and requesting a steroid.

## 2019-06-14 LAB — EKG 12-LEAD
Atrial Rate: 76 {beats}/min
P Axis: 48 degrees
P-R Interval: 152 ms
Q-T Interval: 360 ms
QRS Duration: 84 ms
QTc Calculation (Bazett): 405 ms
R Axis: 90 degrees
T Axis: 44 degrees
Ventricular Rate: 76 {beats}/min

## 2019-06-14 LAB — EKG, 12 LEAD, INITIAL
Atrial Rate: 76 {beats}/min
Calculated P Axis: 48 degrees
Calculated R Axis: 90 degrees
Calculated T Axis: 44 degrees
P-R Interval: 152 ms
Q-T Interval: 360 ms
QRS Duration: 84 ms
QTC Calculation (Bezet): 405 ms
Ventricular Rate: 76 {beats}/min

## 2019-10-06 IMAGING — CT CT RENAL STONE PROTOCOL
2 of 4 series · 16 of 46 positions shown, 18 images · non-contrast
Comparison: 02/28/2018 and prior CTs

CLINICAL DATA: 37-year-old female with acute RIGHT abdominal,
pelvic and flank pain today.

EXAM:
CT ABDOMEN AND PELVIS WITHOUT CONTRAST
TECHNIQUE: Multidetector CT imaging of the abdomen and pelvis was performed
following the standard protocol without IV contrast.

[Series 2: axial st · axial · 0.70mm/px · z∈[+917,+1297]mm · 13 of 88 slices shown, 15 images]
[im 6/88  soft-tissue]
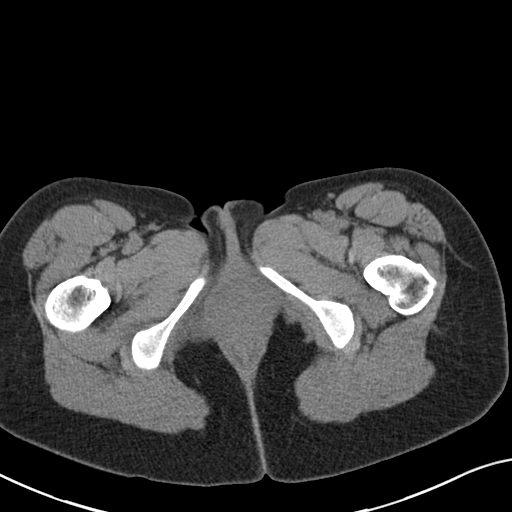
[im 6/88  bone]
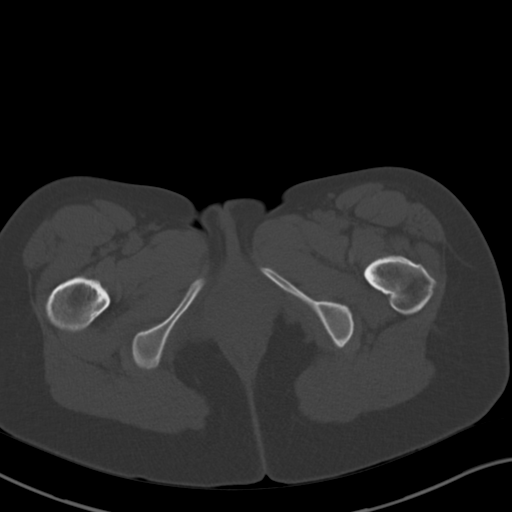
[im 11/88  soft-tissue]
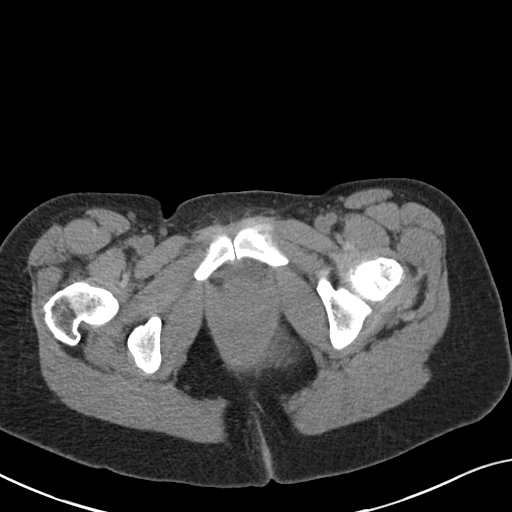
[im 21/88  soft-tissue]
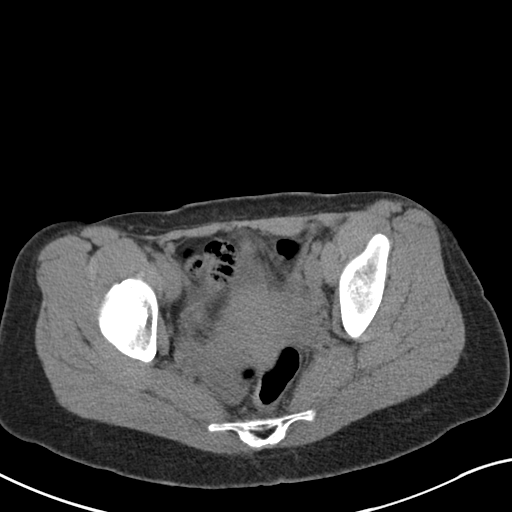
[im 26/88  soft-tissue]
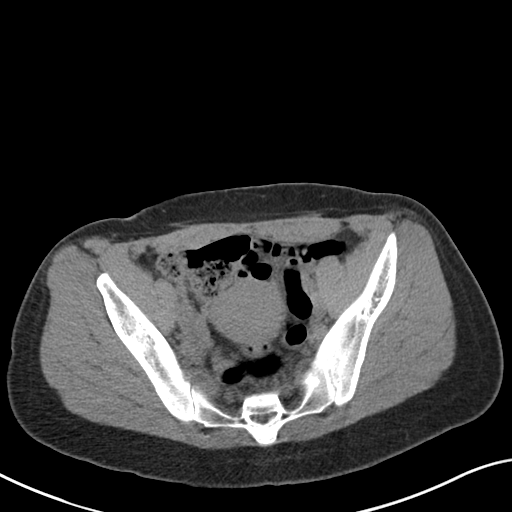
[im 31/88  soft-tissue]
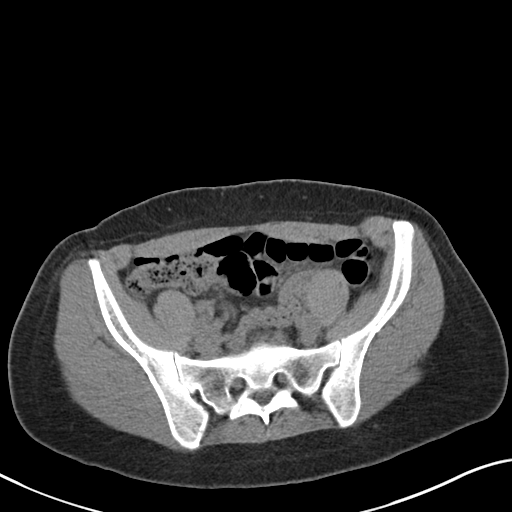
[im 36/88  soft-tissue]
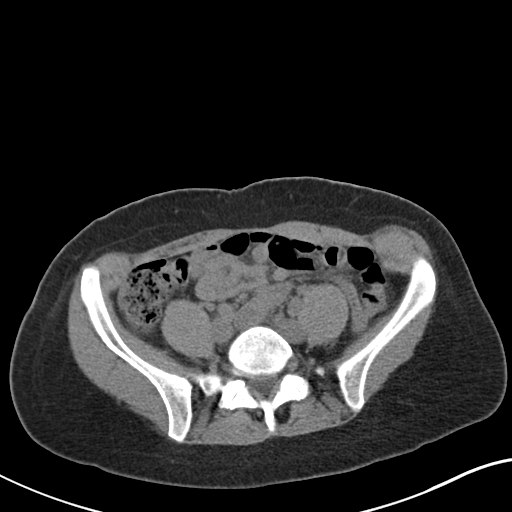
[im 47/88  soft-tissue]
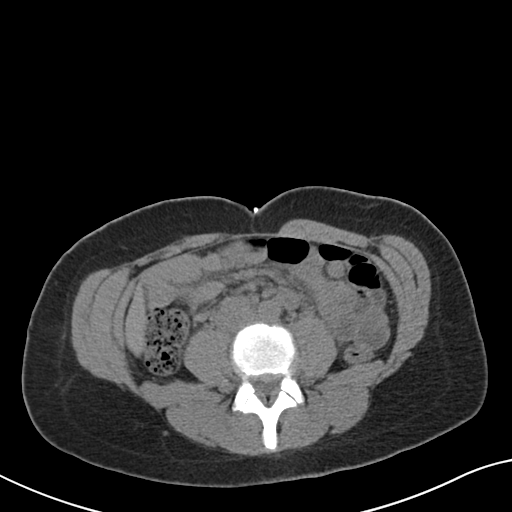
[im 52/88  soft-tissue]
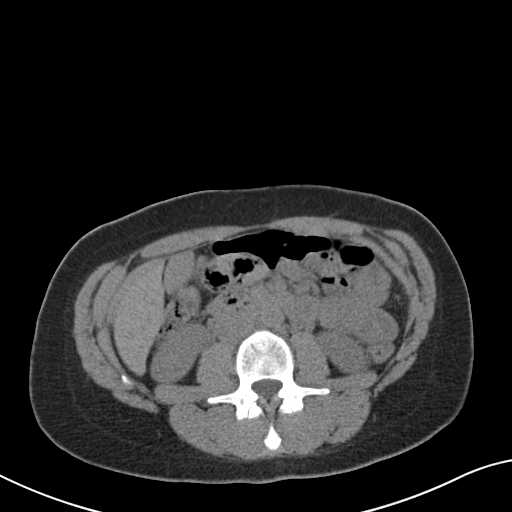
[im 57/88  soft-tissue]
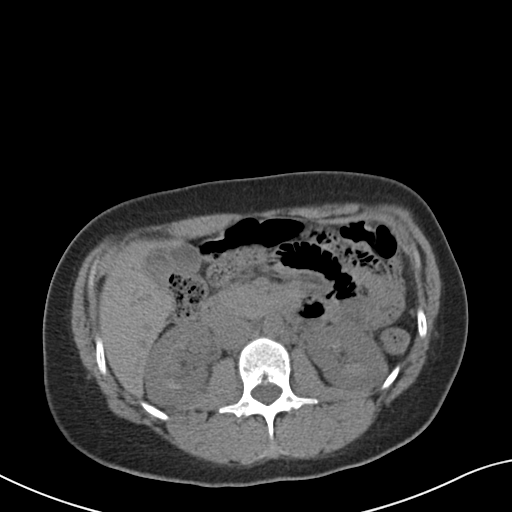
[im 57/88  bone]
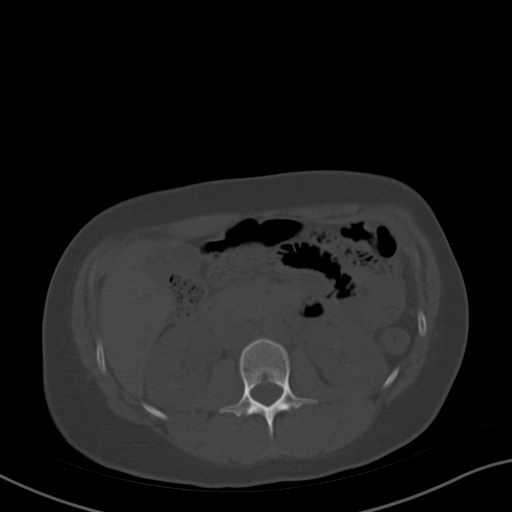
[im 62/88  soft-tissue]
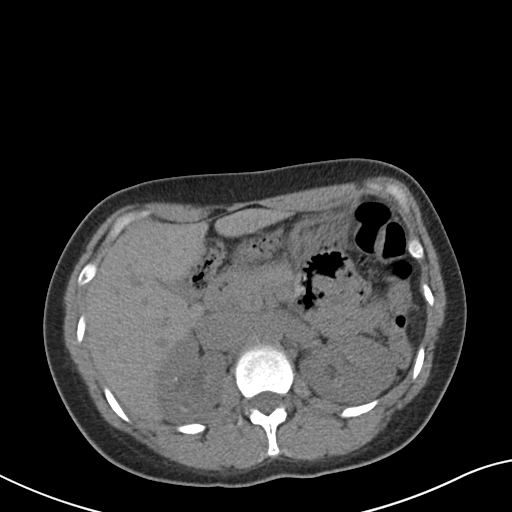
[im 67/88  soft-tissue]
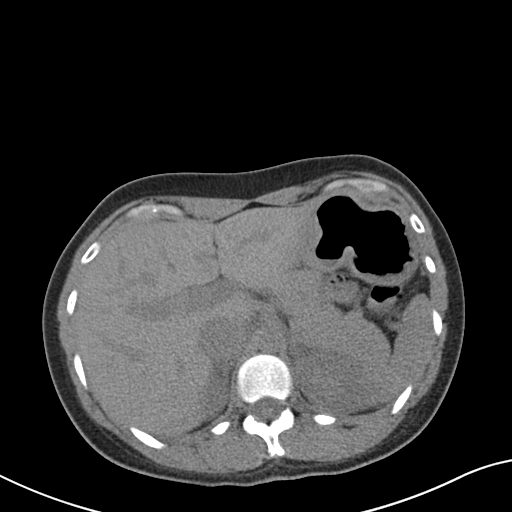
[im 77/88  soft-tissue]
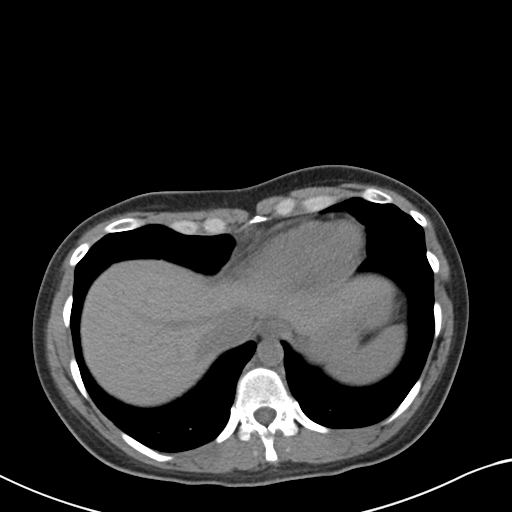
[im 82/88  soft-tissue]
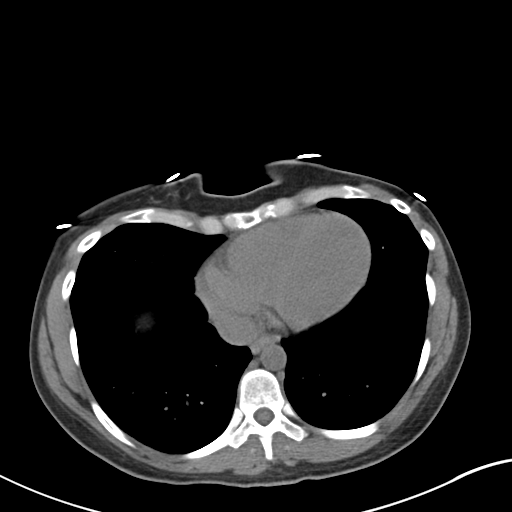

[Series 4: coronal · coronal · 0.77mm/px · 3 of 98 slices shown]
[im 33/98  soft-tissue]
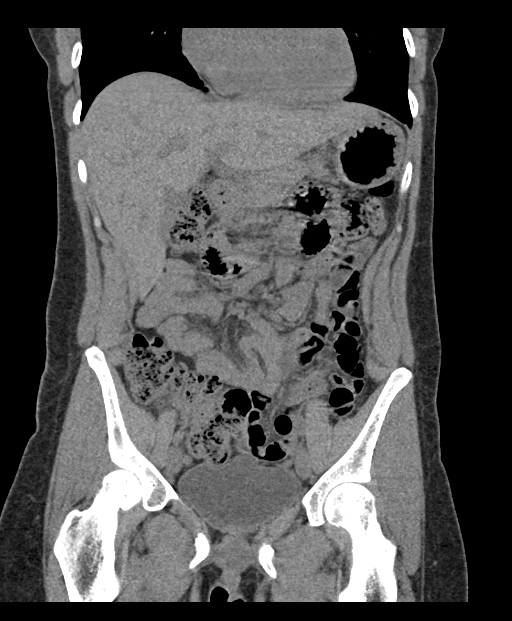
[im 44/98  soft-tissue]
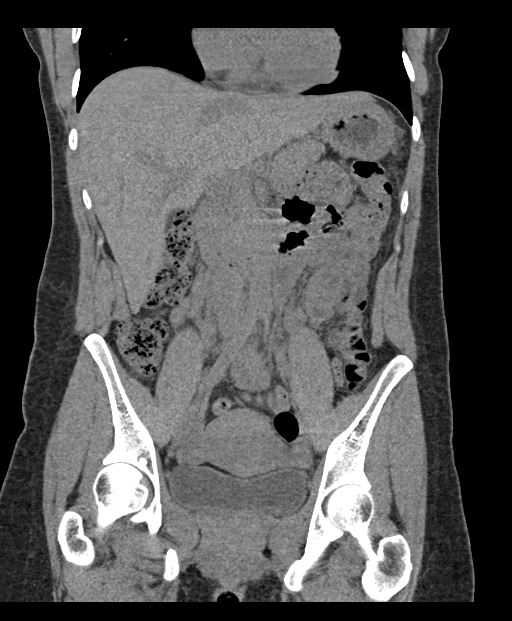
[im 54/98  soft-tissue]
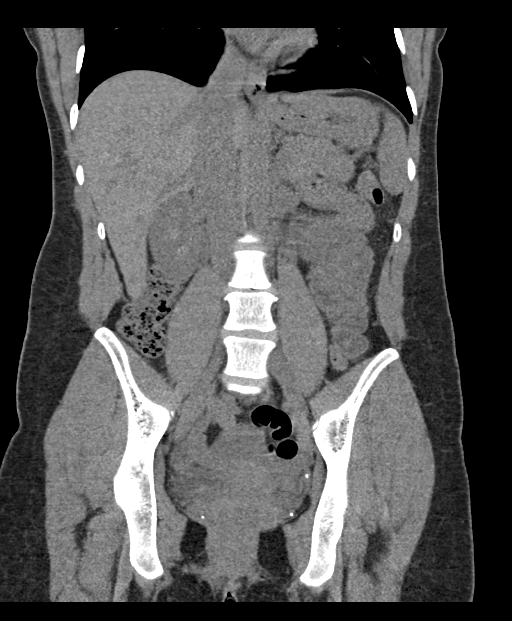

[16 of 46 positions shown; findings below may reference images not displayed]

FINDINGS: Please note that parenchymal abnormalities may be missed without
intravenous contrast.

Lower chest: No acute abnormality.

Hepatobiliary: The liver and gallbladder are unremarkable. No
biliary dilatation.

Pancreas: Unremarkable

Spleen: Unremarkable

Adrenals/Urinary Tract: Multiple nonobstructing bilateral renal
calculi identified with increased densities throughout the medullary
regions bilaterally. The largest calculus measures 7 mm within the
UPPER RIGHT kidney.

There is no evidence of hydronephrosis or obstructing urinary
calculus.

Mild fullness of the RIGHT intrarenal collecting system is again
noted.

The adrenal glands and bladder are unremarkable.

Stomach/Bowel: Stomach is within normal limits. No evidence of bowel
wall thickening, distention, or inflammatory changes. The appendix
is not identified but no findings suspicious for appendicitis noted.

Vascular/Lymphatic: No significant vascular findings are present. No
enlarged abdominal or pelvic lymph nodes.

Reproductive: Uterus and bilateral adnexa are unremarkable.

Other: No ascites, pneumoperitoneum or abdominal wall hernia.

Musculoskeletal: No acute or suspicious bony abnormalities. Mild
degenerative disc disease and mild-moderate broad-based disc bulge
at L3-4 again noted.
IMPRESSION: 1. No evidence of acute abnormality
2. Nonobstructing bilateral renal calculi with medullary
nephrocalcinosis/sponge kidney.
3. Mild degenerative disc disease with mild to moderate broad-based
disc bulge at L3-4.

## 2019-12-05 ENCOUNTER — Emergency Department: Admit: 2019-12-05 | Payer: MEDICAID | Primary: Family Medicine

## 2019-12-05 ENCOUNTER — Inpatient Hospital Stay
Admit: 2019-12-05 | Discharge: 2019-12-05 | Disposition: A | Discharge status: Home / Self Care | Payer: MEDICAID | Attending: Emergency Medicine

## 2019-12-05 DIAGNOSIS — R109 Unspecified abdominal pain: Secondary | ICD-10-CM

## 2019-12-05 LAB — CBC WITH AUTO DIFFERENTIAL
Basophils %: 0 % (ref 0.0–2.0)
Basophils Absolute: 0 10*3/uL (ref 0.0–0.2)
Eosinophils %: 1 % (ref 0.5–7.8)
Eosinophils Absolute: 0 10*3/uL (ref 0.0–0.8)
Granulocyte Absolute Count: 0 10*3/uL (ref 0.0–0.5)
Hematocrit: 42.8 % (ref 35.8–46.3)
Hemoglobin: 14 g/dL (ref 11.7–15.4)
Immature Granulocytes: 0 % (ref 0.0–5.0)
Lymphocytes %: 38 % (ref 13–44)
Lymphocytes Absolute: 1.7 10*3/uL (ref 0.5–4.6)
MCH: 30.9 PG (ref 26.1–32.9)
MCHC: 32.7 g/dL (ref 31.4–35.0)
MCV: 94.5 FL (ref 79.6–97.8)
MPV: 9.6 FL (ref 9.4–12.3)
Monocytes %: 6 % (ref 4.0–12.0)
Monocytes Absolute: 0.3 10*3/uL (ref 0.1–1.3)
NRBC Absolute: 0 10*3/uL (ref 0.0–0.2)
Neutrophils %: 55 % (ref 43–78)
Neutrophils Absolute: 2.4 10*3/uL (ref 1.7–8.2)
Platelets: 199 10*3/uL (ref 150–450)
RBC: 4.53 M/uL (ref 4.05–5.2)
RDW: 14.5 % (ref 11.9–14.6)
WBC: 4.4 10*3/uL (ref 4.3–11.1)

## 2019-12-05 LAB — COMPREHENSIVE METABOLIC PANEL
ALT: 19 U/L (ref 12–65)
AST: 10 U/L — ABNORMAL LOW (ref 15–37)
Albumin/Globulin Ratio: 1 — ABNORMAL LOW (ref 1.2–3.5)
Albumin: 3.7 g/dL (ref 3.5–5.0)
Alkaline Phosphatase: 57 U/L (ref 50–130)
Anion Gap: 9 mmol/L (ref 7–16)
BUN: 14 MG/DL (ref 6–23)
CO2: 24 mmol/L (ref 21–32)
Calcium: 9.1 MG/DL (ref 8.3–10.4)
Chloride: 110 mmol/L — ABNORMAL HIGH (ref 98–107)
Creatinine: 0.75 MG/DL (ref 0.6–1.0)
EGFR IF NonAfrican American: >60 mL/min/{1.73_m2} (ref >60)
GFR African American: >60 mL/min/{1.73_m2} (ref >60)
Globulin: 3.7 g/dL — ABNORMAL HIGH (ref 2.3–3.5)
Glucose: 116 mg/dL — ABNORMAL HIGH (ref 65–100)
Potassium: 3.7 mmol/L (ref 3.5–5.1)
Sodium: 143 mmol/L (ref 136–145)
Total Bilirubin: 0.4 MG/DL (ref 0.2–1.1)
Total Protein: 7.4 g/dL (ref 6.3–8.2)

## 2019-12-05 LAB — HCG QL SERUM
HCG, Ql.: NEGATIVE
HCG, Ql.: NEGATIVE

## 2019-12-05 LAB — LIPASE
Lipase: 131 U/L (ref 73–393)
Lipase: 131 U/L (ref 73–393)

## 2019-12-05 LAB — METABOLIC PANEL, COMPREHENSIVE
A-G Ratio: 1 — ABNORMAL LOW (ref 1.2–3.5)
ALT (SGPT): 19 U/L (ref 12–65)
AST (SGOT): 10 U/L — ABNORMAL LOW (ref 15–37)
Albumin: 3.7 g/dL (ref 3.5–5.0)
Alk. phosphatase: 57 U/L (ref 50–130)
Anion gap: 9 mmol/L (ref 7–16)
BUN: 14 MG/DL (ref 6–23)
Bilirubin, total: 0.4 MG/DL (ref 0.2–1.1)
CO2: 24 mmol/L (ref 21–32)
Calcium: 9.1 MG/DL (ref 8.3–10.4)
Chloride: 110 mmol/L — ABNORMAL HIGH (ref 98–107)
Creatinine: 0.75 MG/DL (ref 0.6–1.0)
GFR est AA: >60 mL/min/{1.73_m2} (ref >60)
GFR est non-AA: >60 mL/min/{1.73_m2} (ref >60)
Globulin: 3.7 g/dL — ABNORMAL HIGH (ref 2.3–3.5)
Glucose: 116 mg/dL — ABNORMAL HIGH (ref 65–100)
Potassium: 3.7 mmol/L (ref 3.5–5.1)
Protein, total: 7.4 g/dL (ref 6.3–8.2)
Sodium: 143 mmol/L (ref 136–145)

## 2019-12-05 LAB — CBC WITH AUTOMATED DIFF
ABS. BASOPHILS: 0 10*3/uL (ref 0.0–0.2)
ABS. EOSINOPHILS: 0 10*3/uL (ref 0.0–0.8)
ABS. IMM. GRANS.: 0 10*3/uL (ref 0.0–0.5)
ABS. LYMPHOCYTES: 1.7 10*3/uL (ref 0.5–4.6)
ABS. MONOCYTES: 0.3 10*3/uL (ref 0.1–1.3)
ABS. NEUTROPHILS: 2.4 10*3/uL (ref 1.7–8.2)
ABSOLUTE NRBC: 0 10*3/uL (ref 0.0–0.2)
BASOPHILS: 0 % (ref 0.0–2.0)
EOSINOPHILS: 1 % (ref 0.5–7.8)
HCT: 42.8 % (ref 35.8–46.3)
HGB: 14 g/dL (ref 11.7–15.4)
IMMATURE GRANULOCYTES: 0 % (ref 0.0–5.0)
LYMPHOCYTES: 38 % (ref 13–44)
MCH: 30.9 PG (ref 26.1–32.9)
MCHC: 32.7 g/dL (ref 31.4–35.0)
MCV: 94.5 FL (ref 79.6–97.8)
MONOCYTES: 6 % (ref 4.0–12.0)
MPV: 9.6 FL (ref 9.4–12.3)
NEUTROPHILS: 55 % (ref 43–78)
PLATELET: 199 10*3/uL (ref 150–450)
RBC: 4.53 M/uL (ref 4.05–5.2)
RDW: 14.5 % (ref 11.9–14.6)
WBC: 4.4 10*3/uL (ref 4.3–11.1)

## 2019-12-05 MED ORDER — SODIUM CHLORIDE 0.9% BOLUS IV
0.9 % | Freq: Once | INTRAVENOUS | Status: AC
Start: 2019-12-05 — End: 2019-12-05
  Administered 2019-12-05: 17:00:00 via INTRAVENOUS

## 2019-12-05 MED ORDER — ONDANSETRON (PF) 4 MG/2 ML INJECTION
4 mg/2 mL | INTRAMUSCULAR | Status: AC
Start: 2019-12-05 — End: 2019-12-05
  Administered 2019-12-05: 17:00:00 via INTRAVENOUS

## 2019-12-05 MED ORDER — KETOROLAC TROMETHAMINE 30 MG/ML INJECTION
30 mg/mL (1 mL) | INTRAMUSCULAR | Status: AC
Start: 2019-12-05 — End: 2019-12-05
  Administered 2019-12-05: 17:00:00 via INTRAVENOUS

## 2019-12-05 MED ORDER — SODIUM CHLORIDE 0.9 % IJ SYRG
Freq: Three times a day (TID) | INTRAMUSCULAR | Status: DC
Start: 2019-12-05 — End: 2019-12-05

## 2019-12-05 MED ORDER — SODIUM CHLORIDE 0.9 % IJ SYRG
INTRAMUSCULAR | Status: DC | PRN
Start: 2019-12-05 — End: 2019-12-05

## 2019-12-05 MED FILL — ONDANSETRON (PF) 4 MG/2 ML INJECTION: 4 mg/2 mL | INTRAMUSCULAR | Qty: 2

## 2019-12-05 MED FILL — KETOROLAC TROMETHAMINE 30 MG/ML INJECTION: 30 mg/mL (1 mL) | INTRAMUSCULAR | Qty: 1

## 2019-12-05 NOTE — ED Provider Notes (Signed)
Patient history of bilateral kidney stones.  Feels like the pain in her right flank and lower abdomen has worsened.  Has some nausea but no vomiting.  No diarrhea or constipation.  Mild dysuria.  No vaginal bleeding or discharge.    The history is provided by the patient. No language interpreter was used.   Flank Pain   This is a new problem. The current episode started more than 2 days ago. The problem has been gradually worsening. The problem occurs constantly. Patient reports not work related injury.The pain is associated with no known injury. The pain is present in the left side and right side. The quality of the pain is described as aching, sharp and similar to previous episodes. The pain radiates to the right groin. The pain is moderate. Associated symptoms include abdominal pain and dysuria. Pertinent negatives include no chest pain, no fever, no numbness, no headaches, no abdominal swelling, no bowel incontinence, no bladder incontinence, no leg pain, no paresthesias and no weakness. She has tried nothing for the symptoms. Risk factors include history of kidney stones.   Abdominal Pain   Associated symptoms include nausea, dysuria and hematuria. Pertinent negatives include no fever, no diarrhea, no vomiting, no headaches, no chest pain and no back pain.        Past Medical History:   Diagnosis Date   ??? Anemia of pregnancy 05/09/2008   ??? Asthma    ??? Chronic kidney disease     kidney stones   ??? General conditions of the kidney and ureter     kidney stones   ??? General conditions of the kidney and ureter     hydronephrosis   ??? GERD (gastroesophageal reflux disease)     mild   ??? Postpartum fever 05/09/2008   ??? Previous cesarean delivery, delivered 05/09/2008       Past Surgical History:   Procedure Laterality Date   ??? HX CESAREAN SECTION      x4   ??? HX LITHOTRIPSY      x5   ??? HX OTHER SURGICAL  201, 2006    lithotripsy x2   ??? HX OTHER SURGICAL  10/11/09    kidney-stent   ??? HX OTHER SURGICAL      c-section   ??? HX  TONSILLECTOMY     ??? HX TUBAL LIGATION      Nov 17, 2008   ??? PR CESAREAN DELIVERY ONLY  2001, 2006, 2009         Family History:   Problem Relation Age of Onset   ??? Cancer Maternal Aunt    ??? Alcohol abuse Neg Hx    ??? Arthritis-rheumatoid Neg Hx    ??? Asthma Neg Hx    ??? Bleeding Prob Neg Hx    ??? Diabetes Neg Hx    ??? Heart Disease Neg Hx    ??? Migraines Neg Hx    ??? Psychiatric Disorder Neg Hx    ??? Stroke Neg Hx    ??? Elevated Lipids Neg Hx    ??? Headache Neg Hx    ??? Hypertension Neg Hx    ??? Lung Disease Neg Hx    ??? Mental Retardation Neg Hx        Social History     Socioeconomic History   ??? Marital status: SINGLE     Spouse name: Not on file   ??? Number of children: Not on file   ??? Years of education: Not on file   ??? Highest   education level: Not on file   Occupational History   ??? Not on file   Tobacco Use   ??? Smoking status: Never Smoker   ??? Smokeless tobacco: Never Used   Substance and Sexual Activity   ??? Alcohol use: No   ??? Drug use: No   ??? Sexual activity: Yes     Partners: Male     Birth control/protection: Surgical   Other Topics Concern   ??? Not on file   Social History Narrative   ??? Not on file     Social Determinants of Health     Financial Resource Strain:    ??? Difficulty of Paying Living Expenses:    Food Insecurity:    ??? Worried About Running Out of Food in the Last Year:    ??? Ran Out of Food in the Last Year:    Transportation Needs:    ??? Lack of Transportation (Medical):    ??? Lack of Transportation (Non-Medical):    Physical Activity:    ??? Days of Exercise per Week:    ??? Minutes of Exercise per Session:    Stress:    ??? Feeling of Stress :    Social Connections:    ??? Frequency of Communication with Friends and Family:    ??? Frequency of Social Gatherings with Friends and Family:    ??? Attends Religious Services:    ??? Active Member of Clubs or Organizations:    ??? Attends Club or Organization Meetings:    ??? Marital Status:    Intimate Partner Violence:    ??? Fear of Current or Ex-Partner:    ??? Emotionally Abused:    ???  Physically Abused:    ??? Sexually Abused:          ALLERGIES: Meperidine and Sulfa (sulfonamide antibiotics)    Review of Systems   Constitutional: Negative for chills and fever.   HENT: Negative for rhinorrhea and sore throat.    Eyes: Negative for pain and redness.   Respiratory: Negative for chest tightness, shortness of breath and wheezing.    Cardiovascular: Negative for chest pain and leg swelling.   Gastrointestinal: Positive for abdominal pain and nausea. Negative for bowel incontinence, diarrhea and vomiting.   Genitourinary: Positive for dysuria, flank pain and hematuria. Negative for bladder incontinence, vaginal bleeding and vaginal discharge.   Musculoskeletal: Negative for back pain, gait problem, neck pain and neck stiffness.   Skin: Negative for color change and rash.   Neurological: Negative for weakness, numbness, headaches and paresthesias.   All other systems reviewed and are negative.      Vitals:    12/05/19 1216 12/05/19 1217   BP:  118/63   Pulse: 84    Resp: 20    Temp: 98.1 ??F (36.7 ??C)    SpO2: 100%    Weight: 64.9 kg (143 lb)    Height: 6' (1.829 m)             Physical Exam  Constitutional:       Appearance: She is well-developed.   HENT:      Head: Normocephalic and atraumatic.   Cardiovascular:      Rate and Rhythm: Normal rate and regular rhythm.   Pulmonary:      Effort: Pulmonary effort is normal.      Breath sounds: Normal breath sounds.   Abdominal:      General: Bowel sounds are normal.      Palpations: Abdomen is soft.        Tenderness: There is abdominal tenderness (lower abd and mild right flank).   Musculoskeletal:         General: No swelling. Normal range of motion.      Cervical back: Normal range of motion and neck supple.   Skin:     General: Skin is warm and dry.   Neurological:      Mental Status: She is alert and oriented to person, place, and time.          MDM  Number of Diagnoses or Management Options  Diagnosis management comments: CT no change in her known renal  stones.  No obstruction.  UA negative for infection.  No acute on blood work.  Will discharge with urology follow-up.       Amount and/or Complexity of Data Reviewed  Clinical lab tests: ordered and reviewed  Tests in the radiology section of CPT??: ordered and reviewed  Tests in the medicine section of CPT??: ordered and reviewed    Patient Progress  Patient progress: stable         Procedures        Results Include:    Recent Results (from the past 24 hour(s))   CBC WITH AUTOMATED DIFF    Collection Time: 12/05/19 12:38 PM   Result Value Ref Range    WBC 4.4 4.3 - 11.1 K/uL    RBC 4.53 4.05 - 5.2 M/uL    HGB 14.0 11.7 - 15.4 g/dL    HCT 42.8 35.8 - 46.3 %    MCV 94.5 79.6 - 97.8 FL    MCH 30.9 26.1 - 32.9 PG    MCHC 32.7 31.4 - 35.0 g/dL    RDW 14.5 11.9 - 14.6 %    PLATELET 199 150 - 450 K/uL    MPV 9.6 9.4 - 12.3 FL    ABSOLUTE NRBC 0.00 0.0 - 0.2 K/uL    DF AUTOMATED      NEUTROPHILS 55 43 - 78 %    LYMPHOCYTES 38 13 - 44 %    MONOCYTES 6 4.0 - 12.0 %    EOSINOPHILS 1 0.5 - 7.8 %    BASOPHILS 0 0.0 - 2.0 %    IMMATURE GRANULOCYTES 0 0.0 - 5.0 %    ABS. NEUTROPHILS 2.4 1.7 - 8.2 K/UL    ABS. LYMPHOCYTES 1.7 0.5 - 4.6 K/UL    ABS. MONOCYTES 0.3 0.1 - 1.3 K/UL    ABS. EOSINOPHILS 0.0 0.0 - 0.8 K/UL    ABS. BASOPHILS 0.0 0.0 - 0.2 K/UL    ABS. IMM. GRANS. 0.0 0.0 - 0.5 K/UL   METABOLIC PANEL, COMPREHENSIVE    Collection Time: 12/05/19 12:38 PM   Result Value Ref Range    Sodium 143 136 - 145 mmol/L    Potassium 3.7 3.5 - 5.1 mmol/L    Chloride 110 (H) 98 - 107 mmol/L    CO2 24 21 - 32 mmol/L    Anion gap 9 7 - 16 mmol/L    Glucose 116 (H) 65 - 100 mg/dL    BUN 14 6 - 23 MG/DL    Creatinine 0.75 0.6 - 1.0 MG/DL    GFR est AA >60 >60 ml/min/1.73m2    GFR est non-AA >60 >60 ml/min/1.73m2    Calcium 9.1 8.3 - 10.4 MG/DL    Bilirubin, total 0.4 0.2 - 1.1 MG/DL    ALT (SGPT) 19 12 - 65 U/L    AST (SGOT) 10 (L) 15 - 37 U/L      Alk. phosphatase 57 50 - 130 U/L    Protein, total 7.4 6.3 - 8.2 g/dL    Albumin 3.7 3.5 - 5.0 g/dL     Globulin 3.7 (H) 2.3 - 3.5 g/dL    A-G Ratio 1.0 (L) 1.2 - 3.5     LIPASE    Collection Time: 12/05/19 12:38 PM   Result Value Ref Range    Lipase 131 73 - 393 U/L   HCG QL SERUM    Collection Time: 12/05/19 12:38 PM   Result Value Ref Range    HCG, Ql. Negative NEG                  CT ABD/PEL FOR RENAL STONE (Final result)  Result time 12/05/19 14:23:20  Procedure changed from CT UROGRAM WO CONT  Final result by Parrott, John F Jr., MD (12/05/19 14:23:20)                Impression:      1. No ureteral calculi or hydronephrosis.     2. Nonobstructing right renal collecting system stone and bilateral renal   parenchymal calcifications.               Narrative:    CT ABDOMEN AND PELVIS WITHOUT CONTRAST 12/05/2019 2:06 PM     History: ??Right flank pain and hematuria     Technique: Noncontrast ??axial images were obtained through the abdomen and   pelvis per the renal stone protocol. Coronal reformatted images were generated.   Dose reduction techniques were used such as automated exposure control,   adjustment of the MA or KV according to patient size, and iterative   reconstruction.     Comparison: ??September 17, 2018     Findings: Included portions of the lung bases are clear.     ABDOMEN: ?? ??There are multiple calcifications and nonobstructing stones within   the renal collecting systems. There are no ureteral calculi and there is no   hydronephrosis. Evaluation of the abdominal viscera is limited without IV   contrast. The unenhanced appearance of the gallbladder, liver, pancreas, spleen   and adrenal glands is normal.     The appendix is normal in appearance.     PELVIS: There are multiple calcified pelvic phleboliths. The uterus is present.   The bladder is normal appearance. There is no free pelvic fluid. ??Bone windows   demonstrated no aggressive osseous lesions.             ??     UA neg.

## 2019-12-05 NOTE — ED Notes (Signed)
 I have reviewed discharge instructions with the patient.  The patient verbalized understanding.    Patient left ED via Discharge Method: ambulatory to Home with family.    Opportunity for questions and clarification provided.       Patient given 0 scripts.         To continue your aftercare when you leave the hospital, you may receive an automated call from our care team to check in on how you are doing.  This is a free service and part of our promise to provide the best care and service to meet your aftercare needs." If you have questions, or wish to unsubscribe from this service please call [phone].  Thank you for Choosing our [facility] Emergency Department.

## 2019-12-05 NOTE — ED Notes (Signed)
Patient report no menstrual cycle for at least 3 months. POC UPT negative. Serum HCG ordered.

## 2019-12-05 NOTE — ED Notes (Signed)
Pt reports right flank and mid lower abd pain x 2 days.

## 2019-12-05 NOTE — ED Triage Notes (Signed)
Pt reports right flank and mid lower abd pain x 2 days.

## 2019-12-05 NOTE — ED Provider Notes (Signed)
Patient history of bilateral kidney stones.  Feels like the pain in her right flank and lower abdomen has worsened.  Has some nausea but no vomiting.  No diarrhea or constipation.  Mild dysuria.  No vaginal bleeding or discharge.    The history is provided by the patient. No language interpreter was used.   Flank Pain   This is a new problem. The current episode started more than 2 days ago. The problem has been gradually worsening. The problem occurs constantly. Patient reports not work related injury.The pain is associated with no known injury. The pain is present in the left side and right side. The quality of the pain is described as aching, sharp and similar to previous episodes. The pain radiates to the right groin. The pain is moderate. Associated symptoms include abdominal pain and dysuria. Pertinent negatives include no chest pain, no fever, no numbness, no headaches, no abdominal swelling, no bowel incontinence, no bladder incontinence, no leg pain, no paresthesias and no weakness. She has tried nothing for the symptoms. Risk factors include history of kidney stones.   Abdominal Pain   Associated symptoms include nausea, dysuria and hematuria. Pertinent negatives include no fever, no diarrhea, no vomiting, no headaches, no chest pain and no back pain.        Past Medical History:   Diagnosis Date   ??? Anemia of pregnancy 05/09/2008   ??? Asthma    ??? Chronic kidney disease     kidney stones   ??? General conditions of the kidney and ureter     kidney stones   ??? General conditions of the kidney and ureter     hydronephrosis   ??? GERD (gastroesophageal reflux disease)     mild   ??? Postpartum fever 05/09/2008   ??? Previous cesarean delivery, delivered 05/09/2008       Past Surgical History:   Procedure Laterality Date   ??? HX CESAREAN SECTION      x4   ??? HX LITHOTRIPSY      x5   ??? HX OTHER SURGICAL  201, 2006    lithotripsy x2   ??? HX OTHER SURGICAL  10/11/09    kidney-stent   ??? HX OTHER SURGICAL      c-section   ??? HX  TONSILLECTOMY     ??? HX TUBAL LIGATION      Nov 17, 2008   ??? PR CESAREAN DELIVERY ONLY  2001, 2006, 2009         Family History:   Problem Relation Age of Onset   ??? Cancer Maternal Aunt    ??? Alcohol abuse Neg Hx    ??? Arthritis-rheumatoid Neg Hx    ??? Asthma Neg Hx    ??? Bleeding Prob Neg Hx    ??? Diabetes Neg Hx    ??? Heart Disease Neg Hx    ??? Migraines Neg Hx    ??? Psychiatric Disorder Neg Hx    ??? Stroke Neg Hx    ??? Elevated Lipids Neg Hx    ??? Headache Neg Hx    ??? Hypertension Neg Hx    ??? Lung Disease Neg Hx    ??? Mental Retardation Neg Hx        Social History     Socioeconomic History   ??? Marital status: SINGLE     Spouse name: Not on file   ??? Number of children: Not on file   ??? Years of education: Not on file   ??? Highest  education level: Not on file   Occupational History   ??? Not on file   Tobacco Use   ??? Smoking status: Never Smoker   ??? Smokeless tobacco: Never Used   Substance and Sexual Activity   ??? Alcohol use: No   ??? Drug use: No   ??? Sexual activity: Yes     Partners: Male     Birth control/protection: Surgical   Other Topics Concern   ??? Not on file   Social History Narrative   ??? Not on file     Social Determinants of Health     Financial Resource Strain:    ??? Difficulty of Paying Living Expenses:    Food Insecurity:    ??? Worried About Charity fundraiser in the Last Year:    ??? Arboriculturist in the Last Year:    Transportation Needs:    ??? Film/video editor (Medical):    ??? Lack of Transportation (Non-Medical):    Physical Activity:    ??? Days of Exercise per Week:    ??? Minutes of Exercise per Session:    Stress:    ??? Feeling of Stress :    Social Connections:    ??? Frequency of Communication with Friends and Family:    ??? Frequency of Social Gatherings with Friends and Family:    ??? Attends Religious Services:    ??? Marine scientist or Organizations:    ??? Attends Music therapist:    ??? Marital Status:    Intimate Production manager Violence:    ??? Fear of Current or Ex-Partner:    ??? Emotionally Abused:    ???  Physically Abused:    ??? Sexually Abused:          ALLERGIES: Meperidine and Sulfa (sulfonamide antibiotics)    Review of Systems   Constitutional: Negative for chills and fever.   HENT: Negative for rhinorrhea and sore throat.    Eyes: Negative for pain and redness.   Respiratory: Negative for chest tightness, shortness of breath and wheezing.    Cardiovascular: Negative for chest pain and leg swelling.   Gastrointestinal: Positive for abdominal pain and nausea. Negative for bowel incontinence, diarrhea and vomiting.   Genitourinary: Positive for dysuria, flank pain and hematuria. Negative for bladder incontinence, vaginal bleeding and vaginal discharge.   Musculoskeletal: Negative for back pain, gait problem, neck pain and neck stiffness.   Skin: Negative for color change and rash.   Neurological: Negative for weakness, numbness, headaches and paresthesias.   All other systems reviewed and are negative.      Vitals:    12/05/19 1216 12/05/19 1217   BP:  118/63   Pulse: 84    Resp: 20    Temp: 98.1 ??F (36.7 ??C)    SpO2: 100%    Weight: 64.9 kg (143 lb)    Height: 6' (1.829 m)             Physical Exam  Constitutional:       Appearance: She is well-developed.   HENT:      Head: Normocephalic and atraumatic.   Cardiovascular:      Rate and Rhythm: Normal rate and regular rhythm.   Pulmonary:      Effort: Pulmonary effort is normal.      Breath sounds: Normal breath sounds.   Abdominal:      General: Bowel sounds are normal.      Palpations: Abdomen is soft.  Tenderness: There is abdominal tenderness (lower abd and mild right flank).   Musculoskeletal:         General: No swelling. Normal range of motion.      Cervical back: Normal range of motion and neck supple.   Skin:     General: Skin is warm and dry.   Neurological:      Mental Status: She is alert and oriented to person, place, and time.          MDM  Number of Diagnoses or Management Options  Diagnosis management comments: CT no change in her known renal  stones.  No obstruction.  UA negative for infection.  No acute on blood work.  Will discharge with urology follow-up.       Amount and/or Complexity of Data Reviewed  Clinical lab tests: ordered and reviewed  Tests in the radiology section of CPT??: ordered and reviewed  Tests in the medicine section of CPT??: ordered and reviewed    Patient Progress  Patient progress: stable         Procedures        Results Include:    Recent Results (from the past 24 hour(s))   CBC WITH AUTOMATED DIFF    Collection Time: 12/05/19 12:38 PM   Result Value Ref Range    WBC 4.4 4.3 - 11.1 K/uL    RBC 4.53 4.05 - 5.2 M/uL    HGB 14.0 11.7 - 15.4 g/dL    HCT 42.8 35.8 - 46.3 %    MCV 94.5 79.6 - 97.8 FL    MCH 30.9 26.1 - 32.9 PG    MCHC 32.7 31.4 - 35.0 g/dL    RDW 14.5 11.9 - 14.6 %    PLATELET 199 150 - 450 K/uL    MPV 9.6 9.4 - 12.3 FL    ABSOLUTE NRBC 0.00 0.0 - 0.2 K/uL    DF AUTOMATED      NEUTROPHILS 55 43 - 78 %    LYMPHOCYTES 38 13 - 44 %    MONOCYTES 6 4.0 - 12.0 %    EOSINOPHILS 1 0.5 - 7.8 %    BASOPHILS 0 0.0 - 2.0 %    IMMATURE GRANULOCYTES 0 0.0 - 5.0 %    ABS. NEUTROPHILS 2.4 1.7 - 8.2 K/UL    ABS. LYMPHOCYTES 1.7 0.5 - 4.6 K/UL    ABS. MONOCYTES 0.3 0.1 - 1.3 K/UL    ABS. EOSINOPHILS 0.0 0.0 - 0.8 K/UL    ABS. BASOPHILS 0.0 0.0 - 0.2 K/UL    ABS. IMM. GRANS. 0.0 0.0 - 0.5 K/UL   METABOLIC PANEL, COMPREHENSIVE    Collection Time: 12/05/19 12:38 PM   Result Value Ref Range    Sodium 143 136 - 145 mmol/L    Potassium 3.7 3.5 - 5.1 mmol/L    Chloride 110 (H) 98 - 107 mmol/L    CO2 24 21 - 32 mmol/L    Anion gap 9 7 - 16 mmol/L    Glucose 116 (H) 65 - 100 mg/dL    BUN 14 6 - 23 MG/DL    Creatinine 0.75 0.6 - 1.0 MG/DL    GFR est AA >60 >60 ml/min/1.51m    GFR est non-AA >60 >60 ml/min/1.723m   Calcium 9.1 8.3 - 10.4 MG/DL    Bilirubin, total 0.4 0.2 - 1.1 MG/DL    ALT (SGPT) 19 12 - 65 U/L    AST (SGOT) 10 (L) 15 - 37 U/L  Alk. phosphatase 57 50 - 130 U/L    Protein, total 7.4 6.3 - 8.2 g/dL    Albumin 3.7 3.5 - 5.0 g/dL     Globulin 3.7 (H) 2.3 - 3.5 g/dL    A-G Ratio 1.0 (L) 1.2 - 3.5     LIPASE    Collection Time: 12/05/19 12:38 PM   Result Value Ref Range    Lipase 131 73 - 393 U/L   HCG QL SERUM    Collection Time: 12/05/19 12:38 PM   Result Value Ref Range    HCG, Ql. Negative NEG                  CT ABD/PEL FOR RENAL STONE (Final result)  Result time 12/05/19 14:23:20  Procedure changed from [place] CONT  Final result by Ian Malkin., MD (12/05/19 14:23:20)                Impression:      1. No ureteral calculi or hydronephrosis.     2. Nonobstructing right renal collecting system stone and bilateral renal   parenchymal calcifications.               Narrative:    CT ABDOMEN AND PELVIS WITHOUT CONTRAST 12/05/2019 2:06 PM     History: ??Right flank pain and hematuria     Technique: Noncontrast ??axial images were obtained through the abdomen and   pelvis per the renal stone protocol. Coronal reformatted images were generated.   Dose reduction techniques were used such as automated exposure control,   adjustment of the MA or KV according to patient size, and iterative   reconstruction.     Comparison: ??September 17, 2018     Findings: Included portions of the lung bases are clear.     ABDOMEN: ?? ??There are multiple calcifications and nonobstructing stones within   the renal collecting systems. There are no ureteral calculi and there is no   hydronephrosis. Evaluation of the abdominal viscera is limited without IV   contrast. The unenhanced appearance of the gallbladder, liver, pancreas, spleen   and adrenal glands is normal.     The appendix is normal in appearance.     PELVIS: There are multiple calcified pelvic phleboliths. The uterus is present.   The bladder is normal appearance. There is no free pelvic fluid. ??Bone windows   demonstrated no aggressive osseous lesions.             ??     UA neg.

## 2019-12-05 NOTE — ED Notes (Signed)
Patient report no menstrual cycle for at least 3 months. POC UPT negative. Serum HCG ordered.

## 2019-12-05 NOTE — ED Notes (Signed)
I have reviewed discharge instructions with the patient.  The patient verbalized understanding.    Patient left ED via Discharge Method: ambulatory to Home with family.    Opportunity for questions and clarification provided.       Patient given 0 scripts.         To continue your aftercare when you leave the hospital, you may receive an automated call from our care team to check in on how you are doing.  This is a free service and part of our promise to provide the best care and service to meet your aftercare needs.??? If you have questions, or wish to unsubscribe from this service please call [phone].  Thank you for Choosing our [place] Emergency Department.

## 2020-11-20 ENCOUNTER — Inpatient Hospital Stay
Admit: 2020-11-20 | Discharge: 2020-11-20 | Disposition: A | Discharge status: Home / Self Care | Payer: MEDICAID | Attending: Emergency Medicine

## 2020-11-20 ENCOUNTER — Emergency Department: Admit: 2020-11-20 | Payer: MEDICAID | Primary: Family Medicine

## 2020-11-20 DIAGNOSIS — N390 Urinary tract infection, site not specified: Secondary | ICD-10-CM

## 2020-11-20 LAB — COMPREHENSIVE METABOLIC PANEL
ALT: 21 U/L (ref 12–65)
AST: 22 U/L (ref 15–37)
Albumin/Globulin Ratio: 0.9 — ABNORMAL LOW (ref 1.2–3.5)
Albumin: 3.4 g/dL — ABNORMAL LOW (ref 3.5–5.0)
Alkaline Phosphatase: 68 U/L (ref 50–130)
Anion Gap: 5 mmol/L — ABNORMAL LOW (ref 7–16)
BUN: 10 MG/DL (ref 6–23)
CO2: 30 mmol/L (ref 21–32)
Calcium: 9.1 MG/DL (ref 8.3–10.4)
Chloride: 110 mmol/L — ABNORMAL HIGH (ref 98–107)
Creatinine: 0.9 MG/DL (ref 0.6–1.0)
EGFR IF NonAfrican American: >60 mL/min/{1.73_m2} (ref >60)
GFR African American: >60 mL/min/{1.73_m2} (ref >60)
Globulin: 3.6 g/dL — ABNORMAL HIGH (ref 2.3–3.5)
Glucose: 117 mg/dL — ABNORMAL HIGH (ref 65–100)
Potassium: 4.1 mmol/L (ref 3.5–5.1)
Sodium: 145 mmol/L (ref 136–145)
Total Bilirubin: 0.4 MG/DL (ref 0.2–1.1)
Total Protein: 7 g/dL (ref 6.3–8.2)

## 2020-11-20 LAB — CBC WITH AUTO DIFFERENTIAL
Basophils %: 0 % (ref 0.0–2.0)
Basophils Absolute: 0 10*3/uL (ref 0.0–0.2)
Eosinophils %: 0 % — ABNORMAL LOW (ref 0.5–7.8)
Eosinophils Absolute: 0 10*3/uL (ref 0.0–0.8)
Granulocyte Absolute Count: 0 10*3/uL (ref 0.0–0.5)
Hematocrit: 39.9 % (ref 35.8–46.3)
Hemoglobin: 12.8 g/dL (ref 11.7–15.4)
Immature Granulocytes: 0 % (ref 0.0–5.0)
Lymphocytes %: 22 % (ref 13–44)
Lymphocytes Absolute: 1.1 10*3/uL (ref 0.5–4.6)
MCH: 30.7 PG (ref 26.1–32.9)
MCHC: 32.1 g/dL (ref 31.4–35.0)
MCV: 95.7 FL (ref 79.6–97.8)
MPV: 9.5 FL (ref 9.4–12.3)
Monocytes %: 7 % (ref 4.0–12.0)
Monocytes Absolute: 0.3 10*3/uL (ref 0.1–1.3)
NRBC Absolute: 0 10*3/uL (ref 0.0–0.2)
Neutrophils %: 71 % (ref 43–78)
Neutrophils Absolute: 3.5 10*3/uL (ref 1.7–8.2)
Platelets: 182 10*3/uL (ref 150–450)
RBC: 4.17 M/uL (ref 4.05–5.2)
RDW: 14 % (ref 11.9–14.6)
WBC: 4.9 10*3/uL (ref 4.3–11.1)

## 2020-11-20 LAB — URINE MICROSCOPIC
Casts UA: 0 /lpf
Casts: 0 /lpf

## 2020-11-20 LAB — LIPASE
Lipase: 173 U/L (ref 73–393)
Lipase: 173 U/L (ref 73–393)

## 2020-11-20 LAB — HCG URINE, QL. - POC
HCG, Pregnancy, Urine, POC: NEGATIVE
Pregnancy test,urine (POC): NEGATIVE

## 2020-11-20 LAB — METABOLIC PANEL, COMPREHENSIVE
A-G Ratio: 0.9 — ABNORMAL LOW (ref 1.2–3.5)
ALT (SGPT): 21 U/L (ref 12–65)
AST (SGOT): 22 U/L (ref 15–37)
Albumin: 3.4 g/dL — ABNORMAL LOW (ref 3.5–5.0)
Alk. phosphatase: 68 U/L (ref 50–130)
Anion gap: 5 mmol/L — ABNORMAL LOW (ref 7–16)
BUN: 10 MG/DL (ref 6–23)
Bilirubin, total: 0.4 MG/DL (ref 0.2–1.1)
CO2: 30 mmol/L (ref 21–32)
Calcium: 9.1 MG/DL (ref 8.3–10.4)
Chloride: 110 mmol/L — ABNORMAL HIGH (ref 98–107)
Creatinine: 0.9 MG/DL (ref 0.6–1.0)
GFR est AA: >60 mL/min/{1.73_m2} (ref >60)
GFR est non-AA: >60 mL/min/{1.73_m2} (ref >60)
Globulin: 3.6 g/dL — ABNORMAL HIGH (ref 2.3–3.5)
Glucose: 117 mg/dL — ABNORMAL HIGH (ref 65–100)
Potassium: 4.1 mmol/L (ref 3.5–5.1)
Protein, total: 7 g/dL (ref 6.3–8.2)
Sodium: 145 mmol/L (ref 136–145)

## 2020-11-20 LAB — CBC WITH AUTOMATED DIFF
ABS. BASOPHILS: 0 10*3/uL (ref 0.0–0.2)
ABS. EOSINOPHILS: 0 10*3/uL (ref 0.0–0.8)
ABS. IMM. GRANS.: 0 10*3/uL (ref 0.0–0.5)
ABS. LYMPHOCYTES: 1.1 10*3/uL (ref 0.5–4.6)
ABS. MONOCYTES: 0.3 10*3/uL (ref 0.1–1.3)
ABS. NEUTROPHILS: 3.5 10*3/uL (ref 1.7–8.2)
ABSOLUTE NRBC: 0 10*3/uL (ref 0.0–0.2)
BASOPHILS: 0 % (ref 0.0–2.0)
EOSINOPHILS: 0 % — ABNORMAL LOW (ref 0.5–7.8)
HCT: 39.9 % (ref 35.8–46.3)
HGB: 12.8 g/dL (ref 11.7–15.4)
IMMATURE GRANULOCYTES: 0 % (ref 0.0–5.0)
LYMPHOCYTES: 22 % (ref 13–44)
MCH: 30.7 PG (ref 26.1–32.9)
MCHC: 32.1 g/dL (ref 31.4–35.0)
MCV: 95.7 FL (ref 79.6–97.8)
MONOCYTES: 7 % (ref 4.0–12.0)
MPV: 9.5 FL (ref 9.4–12.3)
NEUTROPHILS: 71 % (ref 43–78)
PLATELET: 182 10*3/uL (ref 150–450)
RBC: 4.17 M/uL (ref 4.05–5.2)
RDW: 14 % (ref 11.9–14.6)
WBC: 4.9 10*3/uL (ref 4.3–11.1)

## 2020-11-20 MED ORDER — SODIUM CHLORIDE 0.9 % IJ SYRG
Freq: Three times a day (TID) | INTRAMUSCULAR | Status: DC
Start: 2020-11-20 — End: 2020-11-20

## 2020-11-20 MED ORDER — ONDANSETRON (PF) 4 MG/2 ML INJECTION
4 mg/2 mL | INTRAMUSCULAR | Status: AC
Start: 2020-11-20 — End: 2020-11-20
  Administered 2020-11-20: 17:00:00 via INTRAVENOUS

## 2020-11-20 MED ORDER — KETOROLAC TROMETHAMINE 15 MG/ML INJECTION
15 mg/mL | INTRAMUSCULAR | Status: AC
Start: 2020-11-20 — End: 2020-11-20
  Administered 2020-11-20: 17:00:00 via INTRAVENOUS

## 2020-11-20 MED ORDER — CEPHALEXIN 500 MG CAP
500 mg | ORAL_CAPSULE | Freq: Three times a day (TID) | ORAL | 0 refills | Status: AC
Start: 2020-11-20 — End: 2020-11-27

## 2020-11-20 MED ORDER — TAMSULOSIN SR 0.4 MG 24 HR CAP
0.4 mg | ORAL_CAPSULE | Freq: Every day | ORAL | 0 refills | Status: AC
Start: 2020-11-20 — End: 2020-12-05

## 2020-11-20 MED ORDER — SODIUM CHLORIDE 0.9 % IJ SYRG
INTRAMUSCULAR | Status: DC | PRN
Start: 2020-11-20 — End: 2020-11-20

## 2020-11-20 MED ORDER — ONDANSETRON 4 MG TAB, RAPID DISSOLVE
4 mg | ORAL_TABLET | Freq: Three times a day (TID) | ORAL | 0 refills | Status: AC | PRN
Start: 2020-11-20 — End: ?

## 2020-11-20 MED ORDER — SODIUM CHLORIDE 0.9% BOLUS IV
0.9 % | Freq: Once | INTRAVENOUS | Status: AC
Start: 2020-11-20 — End: 2020-11-20
  Administered 2020-11-20: 17:00:00 via INTRAVENOUS

## 2020-11-20 MED ORDER — CEPHALEXIN 500 MG CAP
500 mg | ORAL | Status: AC
Start: 2020-11-20 — End: 2020-11-20
  Administered 2020-11-20: 19:00:00 via ORAL

## 2020-11-20 MED ORDER — HYDROCODONE-ACETAMINOPHEN 5 MG-325 MG TAB
5-325 mg | ORAL_TABLET | Freq: Three times a day (TID) | ORAL | 0 refills | Status: AC | PRN
Start: 2020-11-20 — End: 2020-11-23

## 2020-11-20 MED FILL — ONDANSETRON (PF) 4 MG/2 ML INJECTION: 4 mg/2 mL | INTRAMUSCULAR | Qty: 2

## 2020-11-20 MED FILL — KETOROLAC TROMETHAMINE 15 MG/ML INJECTION: 15 mg/mL | INTRAMUSCULAR | Qty: 1

## 2020-11-20 MED FILL — CEPHALEXIN 500 MG CAP: 500 mg | ORAL | Qty: 1

## 2020-11-20 NOTE — ED Notes (Signed)
I have reviewed discharge instructions with the patient.  The patient verbalized understanding.    Patient left ED via Discharge Method: ambulatory to Home with self.    Opportunity for questions and clarification provided.       Patient given 5 scripts.         To continue your aftercare when you leave the hospital, you may receive an automated call from our care team to check in on how you are doing.  This is a free service and part of our promise to provide the best care and service to meet your aftercare needs." If you have questions, or wish to unsubscribe from this service please call [phone].  Thank you for Choosing our [place] Emergency Department.

## 2020-11-20 NOTE — ED Provider Notes (Signed)
ED Provider Notes by Stephannie Peters, NP at 11/20/20 1300                Author: Stephannie Peters, NP  Service: Emergency Medicine  Author Type: Nurse Practitioner       Filed: 11/20/20 1456  Date of Service: 11/20/20 1300  Status: Attested           Editor: Stephannie Peters, NP (Nurse Practitioner)  Cosigner: Freddi Che, MD at 11/20/20 1510          Attestation signed by Freddi Che, MD at 11/20/20 1510          I was personally available for consultation in the emergency department.  The patient was seen and cared for primarily by the midlevel provider. Freddi Che,  MD                                  40 year old female with a history of kidney stones, asthma, and GERD who presents emergency department today with  complaint of right flank pain.  She states the pain began yesterday.  She reports associated nausea, vomiting, and diarrhea.  She reports history of kidney stones and states that this feels similar to previous episodes.  She denies any fever, dysuria,  hematuria, chest pain, or shortness of breath.  She denies any treatment prior to arrival.      The history is provided by the patient.    Abdominal Pain   This is a new problem. The  current episode started yesterday. The problem occurs  constantly. The problem has not changed since onset.Associated symptoms include abdominal pain. Pertinent negatives include  no chest pain, no headaches and no shortness of breath. Nothing aggravates the symptoms. Nothing relieves the symptoms. She has tried nothing for the symptoms.             Past Medical History:        Diagnosis  Date         ?  Anemia of pregnancy  05/09/2008     ?  Asthma       ?  Chronic kidney disease            kidney stones         ?  General conditions of the kidney and ureter            kidney stones         ?  General conditions of the kidney and ureter            hydronephrosis         ?  GERD (gastroesophageal reflux disease)            mild         ?  Postpartum fever   05/09/2008         ?  Previous cesarean delivery, delivered  05/09/2008             Past Surgical History:         Procedure  Laterality  Date          ?  HX CESAREAN SECTION              x4          ?  HX LITHOTRIPSY              x5          ?  HX OTHER SURGICAL    201, 2006          lithotripsy x2          ?  HX OTHER SURGICAL    10/11/09          kidney-stent          ?  HX OTHER SURGICAL              c-section          ?  HX TONSILLECTOMY         ?  HX TUBAL LIGATION              Nov 17, 2008          ?  PR CESAREAN DELIVERY ONLY    2001, 2006, 2009               Family History:         Problem  Relation  Age of Onset          ?  Cancer  Maternal Aunt       ?  Alcohol abuse  Neg Hx       ?  Arthritis-rheumatoid  Neg Hx       ?  Asthma  Neg Hx       ?  Bleeding Prob  Neg Hx       ?  Diabetes  Neg Hx       ?  Heart Disease  Neg Hx       ?  Migraines  Neg Hx       ?  Psychiatric Disorder  Neg Hx       ?  Stroke  Neg Hx       ?  Elevated Lipids  Neg Hx       ?  Headache  Neg Hx       ?  Hypertension  Neg Hx       ?  Lung Disease  Neg Hx            ?  Mental Retardation  Neg Hx               Social History          Socioeconomic History         ?  Marital status:  SINGLE              Spouse name:  Not on file         ?  Number of children:  Not on file     ?  Years of education:  Not on file     ?  Highest education level:  Not on file       Occupational History        ?  Not on file       Tobacco Use         ?  Smoking status:  Never Smoker     ?  Smokeless tobacco:  Never Used       Substance and Sexual Activity         ?  Alcohol use:  No     ?  Drug use:  No     ?  Sexual activity:  Yes              Partners:  Male         Birth control/protection:  Surgical  Other Topics  Concern        ?  Not on file       Social History Narrative        ?  Not on file          Social Determinants of Health          Financial Resource Strain:         ?  Difficulty of Paying Living Expenses: Not on file       Food  Insecurity:         ?  Worried About Running Out of Food in the Last Year: Not on file     ?  Ran Out of Food in the Last Year: Not on file       Transportation Needs:         ?  Lack of Transportation (Medical): Not on file     ?  Lack of Transportation (Non-Medical): Not on file       Physical Activity:         ?  Days of Exercise per Week: Not on file     ?  Minutes of Exercise per Session: Not on file       Stress:         ?  Feeling of Stress : Not on file       Social Connections:         ?  Frequency of Communication with Friends and Family: Not on file     ?  Frequency of Social Gatherings with Friends and Family: Not on file     ?  Attends Religious Services: Not on file     ?  Active Member of Clubs or Organizations: Not on file     ?  Attends Archivist Meetings: Not on file     ?  Marital Status: Not on file       Intimate Partner Violence:         ?  Fear of Current or Ex-Partner: Not on file     ?  Emotionally Abused: Not on file     ?  Physically Abused: Not on file     ?  Sexually Abused: Not on file       Housing Stability:         ?  Unable to Pay for Housing in the Last Year: Not on file     ?  Number of Places Lived in the Last Year: Not on file        ?  Unstable Housing in the Last Year: Not on file              ALLERGIES: Meperidine and Sulfa (sulfonamide antibiotics)      Review of Systems    Constitutional: Negative for fever.    Respiratory: Negative for shortness of breath.     Cardiovascular: Negative for chest pain.    Gastrointestinal: Positive for abdominal pain, diarrhea , nausea and vomiting.     Genitourinary: Negative for dysuria and hematuria.    Neurological: Negative for headaches.    All other systems reviewed and are negative.           Vitals:          11/20/20 1232        BP:  103/63     Pulse:  89     Resp:  16     Temp:  99 ??F (37.2 ??  C)     SpO2:  99%     Weight:  69.4 kg (153 lb)        Height:  '5\' 11"'$  (1.803 m)                Physical Exam   Vitals and nursing  note reviewed.   Constitutional:        General: She is not in acute distress.     Appearance: Normal appearance. She is well-developed. She is not ill-appearing, toxic-appearing or diaphoretic.   HENT:       Head: Normocephalic and atraumatic.      Right Ear: External ear normal.      Left Ear: External ear normal.      Mouth/Throat:      Mouth: Mucous membranes are moist.      Pharynx: Oropharynx is clear.   Eyes:       General: No scleral icterus.     Extraocular Movements: Extraocular movements intact.      Conjunctiva/sclera: Conjunctivae normal.   Cardiovascular :       Rate and Rhythm: Normal rate.      Heart sounds: Normal heart sounds.    Pulmonary:       Effort: Pulmonary effort is normal. No respiratory distress.      Breath sounds: Normal breath sounds.    Abdominal:      General: Abdomen is flat. Bowel sounds are normal. There is no distension.      Palpations: Abdomen is soft.      Tenderness: There is  abdominal tenderness in the right lower quadrant. There is  right CVA tenderness.     Musculoskeletal:          General: Normal range of motion.      Cervical back: Normal range of motion and neck supple. No rigidity.    Skin:      General: Skin is warm and dry.      Capillary Refill: Capillary refill takes less than 2 seconds.    Neurological:       General: No focal deficit present.      Mental Status: She is alert and oriented to person, place, and time.    Psychiatric:         Mood and Affect: Mood normal.         Behavior: Behavior normal.         Thought Content: Thought content normal.         Judgment: Judgment normal.              MDM   Number of Diagnoses or Management Options   Acute UTI: new  and requires workup   Bilateral renal stones: new and requires workup   Diagnosis management comments: 40 year old female with a history of kidney stones, asthma, and GERD who presents emergency department today with complaint of right flank pain.  She is not toxic  or acutely ill-appearing today. She  is afebrile. She does have some RLQ into right flank tenderness on exam. Labs are unremarkable. CT shows bilateral renal stones and distention of right proximal ureter. Urine indicates acute UTI; will treat with keflex.  She sees a urologist in [place], encouraged follow-up with them. I have discussed the results of all labs, procedures, radiographs, and/or treatments with the patient and available family members. ??Treatment plan is agreed upon by the patient and the  patient is ready for discharge. ??Questions about treatment in the ED and differential diagnosis  of presenting condition were answered. ??Patient was given verbal discharge instructions including, but not limited to, importance of returning to the  emergency department for any concern of worsening or continued symptoms. ??Instructions were given to follow up with a primary care provider or specialist within 1-2 days. ??Adverse effects of medications, if prescribed, were discussed and patient  was advised to refrain from significant physical activity until followed up by primary care physician and to not drive or operate heavy machinery after taking any sedating substances. ??      Stephannie Peters, NP; 11/20/2020 '@2'$ :23 PM Voice dictation software was used during the making of   this note. This software is not perfect and grammatical and other typographical errors   may be present. This note has not been proofread for errors.             Amount and/or Complexity of Data Reviewed   Clinical lab tests: reviewed   Tests in the radiology section of CPT??: reviewed      Risk of Complications, Morbidity, and/or Mortality   Presenting problems: moderate  Diagnostic procedures: moderate  Management options: low     Patient Progress   Patient progress: improved        ED Course as of 11/20/20 1456       Sat Nov 20, 2020        1412  CT ABD/PEL FOR RENAL STONE   Findings: Included portions of the lung bases are clear.   ??   ABDOMEN:    Bilateral renal parenchymal  calcifications are present along with   nonobstructing renal collecting system stones. There is mild distention of the   proximal right ureter. No ureteral calculi are demonstrated. There are multiple   calcified phleboliths within the pelvis.   ??   Evaluation of the abdominal viscera is limited without IV contrast. The   unenhanced appearance of the gallbladder, liver, pancreas, spleen and adrenal   glands is normal.   ??   The appendix is normal demonstrated. There is no inflammation in the right lower   quadrant.   ??   PELVIS: The uterus is present. The bladder is normal appearance. There is no   free pelvic fluid.  Bone windows demonstrated no aggressive osseous lesions.   ??   IMPRESSION   Bilateral nonobstructing renal collecting system stones. Mild   distention of the proximal right ureter without visible ureteral calculi. [BC]              ED Course User Index   [BC] Allen, Warren, NP          Recent Results (from the past 8 hour(s))     CBC WITH AUTOMATED DIFF          Collection Time: 11/20/20 12:59 PM         Result  Value  Ref Range            WBC  4.9  4.3 - 11.1 K/uL            RBC  4.17  4.05 - 5.2 M/uL       HGB  12.8  11.7 - 15.4 g/dL       HCT  39.9  35.8 - 46.3 %       MCV  95.7  79.6 - 97.8 FL       MCH  30.7  26.1 - 32.9 PG       MCHC  32.1  31.4 - 35.0 g/dL  RDW  14.0  11.9 - 14.6 %       PLATELET  182  150 - 450 K/uL       MPV  9.5  9.4 - 12.3 FL       ABSOLUTE NRBC  0.00  0.0 - 0.2 K/uL       DF  AUTOMATED          NEUTROPHILS  71  43 - 78 %       LYMPHOCYTES  22  13 - 44 %       MONOCYTES  7  4.0 - 12.0 %       EOSINOPHILS  0 (L)  0.5 - 7.8 %       BASOPHILS  0  0.0 - 2.0 %       IMMATURE GRANULOCYTES  0  0.0 - 5.0 %       ABS. NEUTROPHILS  3.5  1.7 - 8.2 K/UL       ABS. LYMPHOCYTES  1.1  0.5 - 4.6 K/UL       ABS. MONOCYTES  0.3  0.1 - 1.3 K/UL       ABS. EOSINOPHILS  0.0  0.0 - 0.8 K/UL       ABS. BASOPHILS  0.0  0.0 - 0.2 K/UL       ABS. IMM. GRANS.  0.0  0.0 - 0.5 K/UL       METABOLIC  PANEL, COMPREHENSIVE          Collection Time: 11/20/20 12:59 PM         Result  Value  Ref Range            Sodium  145  136 - 145 mmol/L       Potassium  4.1  3.5 - 5.1 mmol/L       Chloride  110 (H)  98 - 107 mmol/L       CO2  30  21 - 32 mmol/L       Anion gap  5 (L)  7 - 16 mmol/L       Glucose  117 (H)  65 - 100 mg/dL            BUN  10  6 - 23 MG/DL            Creatinine  0.90  0.6 - 1.0 MG/DL       GFR est AA  >60  >60 ml/min/1.74m       GFR est non-AA  >60  >60 ml/min/1.731m      Calcium  9.1  8.3 - 10.4 MG/DL       Bilirubin, total  0.4  0.2 - 1.1 MG/DL       ALT (SGPT)  21  12 - 65 U/L       AST (SGOT)  22  15 - 37 U/L       Alk. phosphatase  68  50 - 130 U/L       Protein, total  7.0  6.3 - 8.2 g/dL       Albumin  3.4 (L)  3.5 - 5.0 g/dL       Globulin  3.6 (H)  2.3 - 3.5 g/dL       A-G Ratio  0.9 (L)  1.2 - 3.5         LIPASE          Collection Time: 11/20/20 12:59 PM  Result  Value  Ref Range            Lipase  173  73 - 393 U/L       HCG URINE, QL. - POC          Collection Time: 11/20/20  1:04 PM         Result  Value  Ref Range            Pregnancy test,urine (POC)  Negative  NEG         URINE MICROSCOPIC          Collection Time: 11/20/20  1:07 PM         Result  Value  Ref Range            WBC  3-5  0 /hpf       RBC  0-3  0 /hpf       Epithelial cells  0-3  0 /hpf       Bacteria  4+ (H)  0 /hpf            Casts  0  0 /lpf           Procedures

## 2020-11-20 NOTE — ED Notes (Signed)
Pt states RLQ pain since yesterday with nausea, vomiting, and diarrhea. States she has not had a period in two years but not on birth control. Masked for triage.

## 2023-04-03 ENCOUNTER — Emergency Department: Admit: 2023-04-03

## 2023-04-03 ENCOUNTER — Inpatient Hospital Stay: Admit: 2023-04-03 | Discharge: 2023-04-03 | Disposition: A | Discharge status: Home / Self Care

## 2023-04-03 DIAGNOSIS — K5900 Constipation, unspecified: Secondary | ICD-10-CM

## 2023-04-03 DIAGNOSIS — N3000 Acute cystitis without hematuria: Secondary | ICD-10-CM

## 2023-04-03 LAB — CBC WITH AUTO DIFFERENTIAL
Basophils %: 0 % (ref 0.0–2.0)
Basophils Absolute: 0 10*3/uL (ref 0.0–0.2)
Eosinophils %: 1 % (ref 0.5–7.8)
Eosinophils Absolute: 0.1 10*3/uL (ref 0.0–0.8)
Hematocrit: 40.6 % (ref 35.8–46.3)
Hemoglobin: 13.1 g/dL (ref 11.7–15.4)
Immature Granulocytes %: 0 % (ref 0.0–5.0)
Immature Granulocytes Absolute: 0 10*3/uL (ref 0.0–0.5)
Lymphocytes %: 32 % (ref 13–44)
Lymphocytes Absolute: 2.4 10*3/uL (ref 0.5–4.6)
MCH: 30.9 pg (ref 26.1–32.9)
MCHC: 32.3 g/dL (ref 31.4–35.0)
MCV: 95.8 FL (ref 82.0–102.0)
MPV: 8.9 FL — ABNORMAL LOW (ref 9.4–12.3)
Monocytes %: 5 % (ref 4.0–12.0)
Monocytes Absolute: 0.4 10*3/uL (ref 0.1–1.3)
Neutrophils %: 62 % (ref 43–78)
Neutrophils Absolute: 4.7 10*3/uL (ref 1.7–8.2)
Platelets: 266 10*3/uL (ref 150–450)
RBC: 4.24 M/uL (ref 4.05–5.2)
RDW: 13.6 % (ref 11.9–14.6)
WBC: 7.5 10*3/uL (ref 4.3–11.1)
nRBC: 0 10*3/uL (ref 0.0–0.2)

## 2023-04-03 LAB — POC PREGNANCY UR-QUAL: Preg Test, Ur: NEGATIVE

## 2023-04-03 LAB — COMPREHENSIVE METABOLIC PANEL
ALT: 10 U/L — ABNORMAL LOW (ref 12–65)
AST: 14 U/L — ABNORMAL LOW (ref 15–37)
Albumin/Globulin Ratio: 1.1 (ref 1.0–1.9)
Albumin: 3.6 g/dL (ref 3.5–5.0)
Alk Phosphatase: 77 U/L (ref 35–104)
Anion Gap: 10 mmol/L (ref 9–18)
BUN: 7 mg/dL (ref 6–23)
CO2: 26 mmol/L (ref 20–28)
Calcium: 9.5 mg/dL (ref 8.8–10.2)
Chloride: 105 mmol/L (ref 98–107)
Creatinine: 0.69 mg/dL (ref 0.60–1.10)
Est, Glom Filt Rate: >90 mL/min/{1.73_m2} (ref >60)
Globulin: 3.3 g/dL (ref 2.3–3.5)
Glucose: 87 mg/dL (ref 70–99)
Potassium: 4.1 mmol/L (ref 3.5–5.1)
Sodium: 141 mmol/L (ref 136–145)
Total Bilirubin: 0.3 mg/dL (ref 0.0–1.2)
Total Protein: 6.8 g/dL (ref 6.3–8.2)

## 2023-04-03 LAB — POCT URINALYSIS DIPSTICK
Bilirubin, Urine, POC: NEGATIVE
Blood, UA POC: NEGATIVE
Glucose, UA POC: NEGATIVE mg/dL
Ketones, Urine, POC: NEGATIVE mg/dL
Leukocyte Est, UA POC: NEGATIVE
Nitrite, Urine, POC: NEGATIVE
Protein, Urine, POC: NEGATIVE mg/dL
Specific Gravity, Urine, POC: >1.03 — ABNORMAL HIGH (ref 1.001–1.023)
URINE UROBILINOGEN POC: 0.2 EU/dL (ref 0.2–1.0)
pH, Urine, POC: 7 (ref 5.0–9.0)

## 2023-04-03 LAB — LIPASE: Lipase: 36 U/L (ref 13–60)

## 2023-04-03 LAB — URINALYSIS, MICRO

## 2023-04-03 MED ORDER — CEPHALEXIN 500 MG PO CAPS
500 | ORAL_CAPSULE | Freq: Two times a day (BID) | ORAL | 0 refills | Status: AC
Start: 2023-04-03 — End: 2023-04-08

## 2023-04-03 MED ORDER — ONDANSETRON HCL 4 MG/2ML IJ SOLN
4 | INTRAMUSCULAR | Status: AC
Start: 2023-04-03 — End: 2023-04-03

## 2023-04-03 MED ORDER — DICYCLOMINE HCL 20 MG PO TABS
20 | ORAL_TABLET | Freq: Four times a day (QID) | ORAL | 0 refills | Status: AC
Start: 2023-04-03 — End: 2023-04-13

## 2023-04-03 MED ORDER — IOPAMIDOL 76 % IV SOLN
76 | Freq: Once | INTRAVENOUS | Status: AC | PRN
Start: 2023-04-03 — End: 2023-04-03

## 2023-04-03 MED ORDER — ONDANSETRON 4 MG PO TBDP
4 | ORAL_TABLET | Freq: Three times a day (TID) | ORAL | 0 refills | Status: AC | PRN
Start: 2023-04-03 — End: ?

## 2023-04-03 MED ORDER — MORPHINE SULFATE (PF) 4 MG/ML IJ SOLN
4 | Freq: Once | INTRAMUSCULAR | Status: AC
Start: 2023-04-03 — End: 2023-04-03

## 2023-04-03 MED ADMIN — morphine sulfate (PF) injection 4 mg: 4 mg | INTRAVENOUS | @ 17:00:00 | NDC 00409189103

## 2023-04-03 MED ADMIN — ondansetron (ZOFRAN) injection 4 mg: 4 mg | INTRAVENOUS | @ 17:00:00 | NDC 23155054731

## 2023-04-03 MED ADMIN — iopamidol (ISOVUE-370) 76 % injection 100 mL: 100 mL | INTRAVENOUS | @ 18:00:00 | NDC 00270131635

## 2023-04-03 MED FILL — ONDANSETRON HCL 4 MG/2ML IJ SOLN: 4 MG/2ML | INTRAMUSCULAR | Qty: 2 | Fill #0

## 2023-04-03 MED FILL — MORPHINE SULFATE 4 MG/ML IJ SOLN: 4 mg/mL | INTRAMUSCULAR | Qty: 1 | Fill #0

## 2023-04-03 NOTE — Discharge Instructions (Addendum)
 Please begin taking the antibiotics for urinary infection.  You did look constipated on your CT scan, begin taking magnesium citrate or MiraLAX to help facilitate bowel movements.  You can take the Bentyl  as needed for abdominal discomfort and Zofran  for nausea.  Continue to stay hydrated and push fluids.  Follow-up with your primary care provider to ensure improvement in your symptoms.    Return to the ED with any new or worsening.

## 2023-04-03 NOTE — ED Notes (Signed)
Patient mobility status  with no difficulty. Provider aware     I have reviewed discharge instructions with the patient.  The patient verbalized understanding.    Patient left ED via Discharge Method: ambulatory to Home with  self .    Opportunity for questions and clarification provided.     Patient given 3 scripts.

## 2023-04-03 NOTE — ED Triage Notes (Signed)
 Patient report blood in the urine for the last 3 days. Does have history of kidney stone. Reports bilateral back pain and lower abdominal pain.

## 2023-04-03 NOTE — ED Provider Notes (Signed)
 Emergency Department Provider Note       PCP: No primary care provider on file.   Age: 42 y.o.   Sex: female     DISPOSITION Decision To Discharge 04/03/2023 02:35:04 PM  Condition at Disposition: Good       ICD-10-CM    1. Acute cystitis without hematuria  N30.00       2. Constipation, unspecified constipation type  K59.00           Medical Decision Making     Patient is a 42 year old female with past medical history of recurrent kidney stones and UTIs presenting with hematuria, flank pain, and lower abdominal discomfort.  Onset of this problem was 3 days ago.  Patient states she stopped noticing blood in her urine yesterday.      On presentation, patient is afebrile, vital signs are stable, and she is well-appearing in no acute distress.  She has tenderness to palpation to bilateral flank regions and her suprapubic region.  No noted rebound, guarding, or distention.    CBC with stable H&H no evidence of leukocytosis.  CMP was stable electrolytes, kidney, and hepatic function.  Lipase is normal.    Urinalysis reveals 1+ bacteria, will send for culture and begin on antibiotics for possible UTI.    CT scan reveals moderate stool burden likely consistent with constipation, patient states she has had difficulty with bowel movements recently.  No other acute abnormalities noted.    On reevaluation of the patient, she only has some mild tenderness to palpation, pain appears to be improved, she is resting in bed comfortably in no acute distress.    Due to stable vital signs, nontoxic appearance, no evidence of sepsis, SIRS, bilateral obstructing stones, pyelonephritis, or any emergent surgical abdominal pathology, plan for this patient as continued outpatient management.    Will begin her on Keflex  for UTI, Bentyl  for abdominal discomfort, and Zofran  for nausea.  She was instructed to begin taking a daily dose of MiraLAX or magnesium citrate to help facilitate bowel movements.  She will follow-up with her primary care  provider to ensure improvement in her symptoms.  She was given strict return precautions such as worsening pain, fever, uncontrolled vomiting, or any new or worsening symptoms in which she should return immediately to the ED.  She verbalized understanding with plan of care and left facility in stable condition.     1 acute complicated illness or injury.  Prescription drug management performed.  Shared medical decision making was utilized in creating the patients health plan today.  I independently ordered and reviewed each unique test.    I reviewed external records: ED visit note from an outside group.  I reviewed external records: provider visit note from PCP.  I reviewed external records: provider visit note from outside specialist.       I interpreted the CT Scan no pneumoperitoneum.              History     Patient is a 43 year old female with past medical history of recurrent kidney stones and UTIs presenting with hematuria, flank pain, and lower abdominal discomfort.  Onset of this problem was 3 days ago.  Patient states she stopped noticing blood in her urine yesterday.  She denies any dysuria, urinary frequency, urinary hesitancy.  She has noticed some discomfort in her back which started first and began to have some pain in her lower abdomen as well.  She states she did have a bowel movement yesterday that  was reportedly normal.  She notes nausea but denies any episodes of vomiting.  Denies any fevers, chills, or night sweats.  Patient has had a previous C-section and tubal ligation but no other abdominal surgeries.  Patient is a primary historian and the quality of the history appears reliable.    The history is provided by the patient. No language interpreter was used.       Physical Exam     Vitals signs and nursing note reviewed:  Vitals:    04/03/23 1159 04/03/23 1312 04/03/23 1316   BP: 122/65 129/80    Pulse: 73     Resp: 18     Temp: 98.3 F (36.8 C)     TempSrc: Oral     SpO2: 99%  100%   Weight:  68 kg (150 lb)     Height: 1.803 m (5' 11)        Physical Exam  Constitutional:       General: She is not in acute distress.     Appearance: Normal appearance. She is not ill-appearing, toxic-appearing or diaphoretic.   HENT:      Head: Normocephalic and atraumatic.      Right Ear: External ear normal.      Left Ear: External ear normal.      Nose: Nose normal.      Mouth/Throat:      Pharynx: Oropharynx is clear.   Eyes:      Conjunctiva/sclera: Conjunctivae normal.   Cardiovascular:      Rate and Rhythm: Normal rate and regular rhythm.   Pulmonary:      Effort: Pulmonary effort is normal. No respiratory distress.   Abdominal:      General: There is no distension.      Palpations: Abdomen is soft. There is no mass or pulsatile mass.      Tenderness: There is abdominal tenderness in the suprapubic area. There is no guarding or rebound. Negative signs include Murphy's sign, Rovsing's sign and McBurney's sign.      Comments: Mild tenderness to palpation of bilateral flanks, no real CVA tenderness.   Musculoskeletal:         General: Normal range of motion.   Skin:     General: Skin is warm.   Neurological:      Mental Status: She is alert and oriented to person, place, and time.   Psychiatric:         Mood and Affect: Mood normal.         Behavior: Behavior normal.        Procedures     Procedures    Orders Placed This Encounter   Procedures   . Culture, Urine   . CT ABDOMEN PELVIS W IV CONTRAST Additional Contrast? None   . CBC with Diff   . CMP   . Lipase   . Urinalysis, Micro   . Diet NPO   . POCT Urine Dipstick   . POC PREGNANCY UR-QUAL   . POCT Urinalysis no Micro   . POC Pregnancy Urine Qual   . Saline lock IV        Medications given during this emergency department visit:  Medications   ondansetron  (ZOFRAN ) injection 4 mg (4 mg IntraVENous Given 04/03/23 1312)   morphine  sulfate (PF) injection 4 mg (4 mg IntraVENous Given 04/03/23 1312)   iopamidol  (ISOVUE -370) 76 % injection 100 mL (100 mLs IntraVENous Given  04/03/23 1333)       New Prescriptions  CEPHALEXIN  (KEFLEX ) 500 MG CAPSULE    Take 1 capsule by mouth 2 times daily for 5 days    DICYCLOMINE  (BENTYL ) 20 MG TABLET    Take 1 tablet by mouth 4 times daily for 10 days    ONDANSETRON  (ZOFRAN -ODT) 4 MG DISINTEGRATING TABLET    Take 1 tablet by mouth 3 times daily as needed for Nausea or Vomiting        Past Medical History:   Diagnosis Date   . Anemia of pregnancy 05/09/2008   . Chronic kidney disease     kidney stones   . General conditions of the kidney and ureter     kidney stones   . General conditions of the kidney and ureter     hydronephrosis   . GERD (gastroesophageal reflux disease)     mild   . Postpartum fever 05/09/2008   . Previous cesarean delivery, delivered 05/09/2008        Past Surgical History:   Procedure Laterality Date   . CESAREAN DELIVERY ONLY  2001, 2006, 2009   . CESAREAN SECTION      x4   . LITHOTRIPSY      x5   . OTHER SURGICAL HISTORY  201, 2006    lithotripsy x2   . OTHER SURGICAL HISTORY  10/11/09    kidney-stent   . OTHER SURGICAL HISTORY      c-section   . TONSILLECTOMY     . TUBAL LIGATION      Nov 17, 2008        Social History     Socioeconomic History   . Marital status: Single     Spouse name: None   . Number of children: None   . Years of education: None   . Highest education level: None   Tobacco Use   . Smoking status: Never   . Smokeless tobacco: Never   Substance and Sexual Activity   . Alcohol use: No   . Drug use: No     Social Determinants of Health     Social Connections: Unknown (10/03/2019)    Received from [facility]    Social Connections    . Frequency of Communication with Friends and Family: Not asked    . Frequency of Social Gatherings with Friends and Family: Not asked   Intimate Partner Violence: Unknown (10/03/2019)    Received from [facility]    Intimate Partner Violence    . Fear of Current or Ex-Partner: Not asked    . Emotionally Abused: Not asked    . Physically Abused: Not asked    . Sexually Abused: Not  asked   Housing Stability: Not At Risk (09/22/2020)    Received from [facility]    . Was there a time when you did not have a steady place to sleep: Not asked    . Worried that the place you are staying is making you sick: Not asked        Previous Medications    ALBUTEROL SULFATE HFA 108 (90 BASE) MCG/ACT INHALER    Inhale 2 puffs into the lungs every 6 hours as needed    NAPROXEN (EC NAPROSYN) 500 MG EC TABLET    Take 500 mg by mouth 2 times daily as needed    ONDANSETRON  (ZOFRAN ) 8 MG TABLET    Take 8 mg by mouth every 8 hours as needed        Results for orders placed  or performed during the hospital encounter of 04/03/23   CT ABDOMEN PELVIS W IV CONTRAST Additional Contrast? None    Narrative    EXAMINATION: CT ABDOMEN PELVIS W IV CONTRAST    EXAM DATE: 04/03/2023 1:40 PM    INDICATION: bilateral flank pain radiating to the groin    COMPARISON: Nov 20, 2020    TECHNIQUE: Axial CT images were obtained of the abdomen and pelvis with  intravenous contrast. Multiplanar reconstructions were performed.         ABDOMEN/PELVIS FINDINGS:    Lower Chest: Unremarkable.    Liver: Normal enhancement and contour.    Biliary/Gallbladder: Unremarkable.    Pancreas: Unremarkable.    Spleen: Unremarkable.    Adrenal Glands: Unremarkable.    Kidneys: Numerous punctate nonobstructing renal calculi are present bilaterally.  There is mild distention of the proximal right renal collecting system without  evidence of obstructing calculus, similar to the previous exam from May 2022.    Gastrointestinal/Peritoneum: Moderate stool burden throughout the colon. There  is some fecalization of the small bowel bowel contents. The appendix is  unremarkable. No free air or free fluid.    Vascular: Unremarkable.    Lymph Nodes: No enlarged lymph nodes by CT size criteria.    Pelvic Organs: Unremarkable.    Bladder: Unremarkable.    Bones: No acute osseous abnormality. Mild to moderate multilevel degenerative  changes are  present in the visualized spine, greatest at L3-4 where vacuum disc  phenomenon is present.    Soft tissues: Unremarkable.        Impression    1.  Moderate stool burden and some fecalization of small intestinal contents,  which can be seen in constipation and small intestinal bacterial overgrowth.    2.  Chronic prominence of the proximal right renal collecting system without any  sign of an obstructive calculus.    3.  Nephrolithiasis.        Electronically signed by Landry Caldron [place]   CBC with Diff   Result Value Ref Range    WBC 7.5 4.3 - 11.1 K/uL    RBC 4.24 4.05 - 5.2 M/uL    Hemoglobin 13.1 11.7 - 15.4 g/dL    Hematocrit 59.3 64.1 - 46.3 %    MCV 95.8 82.0 - 102.0 FL    MCH 30.9 26.1 - 32.9 PG    MCHC 32.3 31.4 - 35.0 g/dL    RDW 86.3 88.0 - 85.3 %    Platelets 266 150 - 450 K/uL    MPV 8.9 (L) 9.4 - 12.3 FL    nRBC 0.00 0.0 - 0.2 K/uL    Differential Type AUTOMATED      Neutrophils % 62 43 - 78 %    Lymphocytes % 32 13 - 44 %    Monocytes % 5 4.0 - 12.0 %    Eosinophils % 1 0.5 - 7.8 %    Basophils % 0 0.0 - 2.0 %    Immature Granulocytes % 0 0.0 - 5.0 %    Neutrophils Absolute 4.7 1.7 - 8.2 K/UL    Lymphocytes Absolute 2.4 0.5 - 4.6 K/UL    Monocytes Absolute 0.4 0.1 - 1.3 K/UL    Eosinophils Absolute 0.1 0.0 - 0.8 K/UL    Basophils Absolute 0.0 0.0 - 0.2 K/UL    Immature Granulocytes Absolute 0.0 0.0 - 0.5 K/UL   CMP   Result Value Ref Range    Sodium 141 136 - 145 mmol/L  Potassium 4.1 3.5 - 5.1 mmol/L    Chloride 105 98 - 107 mmol/L    CO2 26 20 - 28 mmol/L    Anion Gap 10 9 - 18 mmol/L    Glucose 87 70 - 99 mg/dL    BUN 7 6 - 23 MG/DL    Creatinine 9.30 9.39 - 1.10 MG/DL    Est, Glom Filt Rate >90 >60 ml/min/1.58m2    Calcium 9.5 8.8 - 10.2 MG/DL    Total Bilirubin 0.3 0.0 - 1.2 MG/DL    ALT 10 (L) 12 - 65 U/L    AST 14 (L) 15 - 37 U/L    Alk Phosphatase 77 35 - 104 U/L    Total Protein 6.8 6.3 - 8.2 g/dL    Albumin 3.6 3.5 - 5.0 g/dL    Globulin 3.3 2.3 - 3.5 g/dL    Albumin/Globulin Ratio 1.1 1.0 -  1.9     Lipase   Result Value Ref Range    Lipase 36 13 - 60 U/L   Urinalysis, Micro   Result Value Ref Range    WBC, UA 0-4 U4 /hpf    RBC, UA 0-5 U5 /hpf    Epithelial Cells, UA 0-5 U5 /hpf    BACTERIA, URINE 1+ (A) NEG /hpf    Hyaline Casts, UA 0-2 /lpf   POCT Urinalysis no Micro   Result Value Ref Range    Specific Gravity, Urine, POC >1.030 (H) 1.001 - 1.023    pH, Urine, POC 7.0 5.0 - 9.0      Protein, Urine, POC Negative NEG mg/dL    Glucose, UA POC Negative NEG mg/dL    Ketones, Urine, POC Negative NEG mg/dL    Bilirubin, Urine, POC Negative NEG      Blood, UA POC Negative NEG      URINE UROBILINOGEN POC 0.2 0.2 - 1.0 EU/dL    Nitrite, Urine, POC Negative NEG      Leukocyte Est, UA POC Negative NEG      Performed by: Annagayle Vanderwal    POC Pregnancy Urine Qual   Result Value Ref Range    Preg Test, Ur Negative NEG           CT ABDOMEN PELVIS W IV CONTRAST Additional Contrast? None   Final Result      1.  Moderate stool burden and some fecalization of small intestinal contents,   which can be seen in constipation and small intestinal bacterial overgrowth.      2.  Chronic prominence of the proximal right renal collecting system without any   sign of an obstructive calculus.      3.  Nephrolithiasis.            Electronically signed by Landry Jenkins [place]                   No results for input(s): COVID19 in the last 72 hours.    Voice dictation software was used during the making of this note.  This software is not perfect and grammatical and other typographical errors may be present.  This note has not been completely proofread for errors.     Harles Norris, PA-C  04/03/23 1442

## 2023-04-03 NOTE — ED Notes (Signed)
 Pt c/o pain at this time. Dr. Jasmine December PA made aware.

## 2023-04-05 LAB — CULTURE, URINE: Culture: >100000
